# Patient Record
Sex: Female | Born: 1965 | Race: Black or African American | Hispanic: No | Marital: Single | Smoking: Current every day smoker
Health system: Southern US, Community
[De-identification: ages and names within clinical notes are randomized; demographics above are authoritative.]

## PROBLEM LIST (undated history)

## (undated) DIAGNOSIS — E119 Type 2 diabetes mellitus without complications: Secondary | ICD-10-CM

## (undated) DIAGNOSIS — M797 Fibromyalgia: Secondary | ICD-10-CM

## (undated) DIAGNOSIS — J449 Chronic obstructive pulmonary disease, unspecified: Secondary | ICD-10-CM

## (undated) DIAGNOSIS — I10 Essential (primary) hypertension: Secondary | ICD-10-CM

## (undated) DIAGNOSIS — J45909 Unspecified asthma, uncomplicated: Secondary | ICD-10-CM

---

## 2011-05-09 DIAGNOSIS — J45909 Unspecified asthma, uncomplicated: Secondary | ICD-10-CM | POA: Insufficient documentation

## 2011-09-20 DIAGNOSIS — M797 Fibromyalgia: Secondary | ICD-10-CM | POA: Insufficient documentation

## 2011-09-20 DIAGNOSIS — G4700 Insomnia, unspecified: Secondary | ICD-10-CM | POA: Insufficient documentation

## 2011-09-20 DIAGNOSIS — M1990 Unspecified osteoarthritis, unspecified site: Secondary | ICD-10-CM | POA: Insufficient documentation

## 2012-03-01 DIAGNOSIS — K219 Gastro-esophageal reflux disease without esophagitis: Secondary | ICD-10-CM | POA: Insufficient documentation

## 2013-01-24 DIAGNOSIS — F4310 Post-traumatic stress disorder, unspecified: Secondary | ICD-10-CM | POA: Insufficient documentation

## 2013-02-02 DIAGNOSIS — F3181 Bipolar II disorder: Secondary | ICD-10-CM | POA: Insufficient documentation

## 2013-04-21 DIAGNOSIS — G894 Chronic pain syndrome: Secondary | ICD-10-CM | POA: Insufficient documentation

## 2013-05-19 DIAGNOSIS — R32 Unspecified urinary incontinence: Secondary | ICD-10-CM | POA: Insufficient documentation

## 2013-05-22 DIAGNOSIS — Z79899 Other long term (current) drug therapy: Secondary | ICD-10-CM | POA: Insufficient documentation

## 2013-06-26 DIAGNOSIS — R252 Cramp and spasm: Secondary | ICD-10-CM | POA: Insufficient documentation

## 2014-04-03 DIAGNOSIS — K7581 Nonalcoholic steatohepatitis (NASH): Secondary | ICD-10-CM | POA: Insufficient documentation

## 2014-10-03 DIAGNOSIS — E876 Hypokalemia: Secondary | ICD-10-CM | POA: Insufficient documentation

## 2014-10-03 DIAGNOSIS — R296 Repeated falls: Secondary | ICD-10-CM | POA: Insufficient documentation

## 2016-02-03 DIAGNOSIS — I10 Essential (primary) hypertension: Secondary | ICD-10-CM | POA: Insufficient documentation

## 2016-08-27 DIAGNOSIS — Z8673 Personal history of transient ischemic attack (TIA), and cerebral infarction without residual deficits: Secondary | ICD-10-CM | POA: Insufficient documentation

## 2016-09-17 DIAGNOSIS — B351 Tinea unguium: Secondary | ICD-10-CM | POA: Insufficient documentation

## 2018-01-21 DIAGNOSIS — N9089 Other specified noninflammatory disorders of vulva and perineum: Secondary | ICD-10-CM | POA: Insufficient documentation

## 2018-06-22 DIAGNOSIS — I69319 Unspecified symptoms and signs involving cognitive functions following cerebral infarction: Secondary | ICD-10-CM | POA: Insufficient documentation

## 2018-08-21 DIAGNOSIS — F445 Conversion disorder with seizures or convulsions: Secondary | ICD-10-CM | POA: Insufficient documentation

## 2018-08-27 DIAGNOSIS — G929 Unspecified toxic encephalopathy: Secondary | ICD-10-CM | POA: Insufficient documentation

## 2018-12-04 DIAGNOSIS — F331 Major depressive disorder, recurrent, moderate: Secondary | ICD-10-CM | POA: Insufficient documentation

## 2019-08-18 DIAGNOSIS — N94819 Vulvodynia, unspecified: Secondary | ICD-10-CM | POA: Insufficient documentation

## 2019-10-23 DIAGNOSIS — K3184 Gastroparesis: Secondary | ICD-10-CM | POA: Insufficient documentation

## 2020-01-25 DIAGNOSIS — E1142 Type 2 diabetes mellitus with diabetic polyneuropathy: Secondary | ICD-10-CM | POA: Insufficient documentation

## 2020-01-25 DIAGNOSIS — IMO0002 Reserved for concepts with insufficient information to code with codable children: Secondary | ICD-10-CM | POA: Insufficient documentation

## 2020-06-17 DIAGNOSIS — R079 Chest pain, unspecified: Secondary | ICD-10-CM | POA: Insufficient documentation

## 2020-10-09 DIAGNOSIS — F419 Anxiety disorder, unspecified: Secondary | ICD-10-CM | POA: Insufficient documentation

## 2020-10-28 DIAGNOSIS — M2010 Hallux valgus (acquired), unspecified foot: Secondary | ICD-10-CM | POA: Insufficient documentation

## 2020-10-28 DIAGNOSIS — L600 Ingrowing nail: Secondary | ICD-10-CM | POA: Insufficient documentation

## 2021-03-09 ENCOUNTER — Emergency Department: Payer: Medicare HMO

## 2021-03-09 ENCOUNTER — Emergency Department
Admission: EM | Admit: 2021-03-09 | Discharge: 2021-03-09 | Disposition: A | Discharge status: Home / Self Care | Payer: Medicare HMO | Attending: Emergency Medicine | Admitting: Emergency Medicine

## 2021-03-09 ENCOUNTER — Other Ambulatory Visit: Payer: Self-pay

## 2021-03-09 DIAGNOSIS — R1013 Epigastric pain: Secondary | ICD-10-CM | POA: Diagnosis present

## 2021-03-09 DIAGNOSIS — K3184 Gastroparesis: Secondary | ICD-10-CM | POA: Diagnosis not present

## 2021-03-09 DIAGNOSIS — E1142 Type 2 diabetes mellitus with diabetic polyneuropathy: Secondary | ICD-10-CM | POA: Insufficient documentation

## 2021-03-09 DIAGNOSIS — R000 Tachycardia, unspecified: Secondary | ICD-10-CM | POA: Insufficient documentation

## 2021-03-09 DIAGNOSIS — D72829 Elevated white blood cell count, unspecified: Secondary | ICD-10-CM | POA: Insufficient documentation

## 2021-03-09 DIAGNOSIS — R079 Chest pain, unspecified: Secondary | ICD-10-CM | POA: Insufficient documentation

## 2021-03-09 DIAGNOSIS — J45909 Unspecified asthma, uncomplicated: Secondary | ICD-10-CM | POA: Diagnosis not present

## 2021-03-09 DIAGNOSIS — I10 Essential (primary) hypertension: Secondary | ICD-10-CM | POA: Diagnosis not present

## 2021-03-09 LAB — CBC WITH DIFFERENTIAL/PLATELET
Abs Immature Granulocytes: 0.05 10*3/uL (ref 0.00–0.07)
Basophils Absolute: 0 10*3/uL (ref 0.0–0.1)
Basophils Relative: 0 %
Eosinophils Absolute: 0.2 10*3/uL (ref 0.0–0.5)
Eosinophils Relative: 1 %
HCT: 41.3 % (ref 36.0–46.0)
Hemoglobin: 14.1 g/dL (ref 12.0–15.0)
Immature Granulocytes: 0 %
Lymphocytes Relative: 31 %
Lymphs Abs: 4.4 10*3/uL — ABNORMAL HIGH (ref 0.7–4.0)
MCH: 30.2 pg (ref 26.0–34.0)
MCHC: 34.1 g/dL (ref 30.0–36.0)
MCV: 88.4 fL (ref 80.0–100.0)
Monocytes Absolute: 0.6 10*3/uL (ref 0.1–1.0)
Monocytes Relative: 4 %
Neutro Abs: 9.2 10*3/uL — ABNORMAL HIGH (ref 1.7–7.7)
Neutrophils Relative %: 64 %
Platelets: 317 10*3/uL (ref 150–400)
RBC: 4.67 MIL/uL (ref 3.87–5.11)
RDW: 13.6 % (ref 11.5–15.5)
WBC: 14.5 10*3/uL — ABNORMAL HIGH (ref 4.0–10.5)
nRBC: 0 % (ref 0.0–0.2)

## 2021-03-09 LAB — COMPREHENSIVE METABOLIC PANEL
ALT: 49 U/L — ABNORMAL HIGH (ref 0–44)
AST: 32 U/L (ref 15–41)
Albumin: 4 g/dL (ref 3.5–5.0)
Alkaline Phosphatase: 106 U/L (ref 38–126)
Anion gap: 12 (ref 5–15)
BUN: 11 mg/dL (ref 6–20)
CO2: 27 mmol/L (ref 22–32)
Calcium: 10.1 mg/dL (ref 8.9–10.3)
Chloride: 97 mmol/L — ABNORMAL LOW (ref 98–111)
Creatinine, Ser: 0.81 mg/dL (ref 0.44–1.00)
GFR, Estimated: >60 mL/min (ref >60)
Glucose, Bld: 331 mg/dL — ABNORMAL HIGH (ref 70–99)
Potassium: 3.4 mmol/L — ABNORMAL LOW (ref 3.5–5.1)
Sodium: 136 mmol/L (ref 135–145)
Total Bilirubin: 0.8 mg/dL (ref 0.3–1.2)
Total Protein: 8.2 g/dL — ABNORMAL HIGH (ref 6.5–8.1)

## 2021-03-09 LAB — TROPONIN I (HIGH SENSITIVITY)
Troponin I (High Sensitivity): 4 ng/L (ref <18)
Troponin I (High Sensitivity): 4 ng/L (ref <18)

## 2021-03-09 LAB — CBG MONITORING, ED: Glucose-Capillary: 308 mg/dL — ABNORMAL HIGH (ref 70–99)

## 2021-03-09 LAB — LIPASE, BLOOD: Lipase: 29 U/L (ref 11–51)

## 2021-03-09 MED ORDER — ONDANSETRON HCL 4 MG/2ML IJ SOLN
4.0000 mg | Freq: Once | INTRAMUSCULAR | Status: AC
  Administered 2021-03-09: 4 mg via INTRAVENOUS
  Filled 2021-03-09: qty 2

## 2021-03-09 MED ORDER — FAMOTIDINE IN NACL 20-0.9 MG/50ML-% IV SOLN
20.0000 mg | Freq: Once | INTRAVENOUS | Status: AC
  Administered 2021-03-09: 20 mg via INTRAVENOUS
  Filled 2021-03-09: qty 50

## 2021-03-09 MED ORDER — FENTANYL CITRATE (PF) 100 MCG/2ML IJ SOLN
50.0000 ug | Freq: Once | INTRAMUSCULAR | Status: AC
  Administered 2021-03-09: 50 ug via INTRAVENOUS
  Filled 2021-03-09: qty 2

## 2021-03-09 MED ORDER — ONDANSETRON 4 MG PO TBDP: 4.0000 mg | ORAL_TABLET | Freq: Three times a day (TID) | ORAL | 0 refills | Status: DC | PRN

## 2021-03-09 MED ORDER — HALOPERIDOL LACTATE 5 MG/ML IJ SOLN
5.0000 mg | Freq: Once | INTRAMUSCULAR | Status: AC
  Administered 2021-03-09: 5 mg via INTRAVENOUS
  Filled 2021-03-09: qty 1

## 2021-03-09 MED ORDER — LACTATED RINGERS IV BOLUS
1000.0000 mL | Freq: Once | INTRAVENOUS | Status: AC
  Administered 2021-03-09: 1000 mL via INTRAVENOUS

## 2021-03-09 NOTE — ED Provider Notes (Signed)
[facility] Emergency Department Provider Note  ____________________________________________  Time seen: Approximately 3:06 AM  I have reviewed the triage vital signs and the nursing notes.   HISTORY  Chief Complaint Chest Pain (Chest pain with n/v onset of 0600)   HPI Alicia Mayo is a 55 y.o. female with history of diabetes, gastroparesis, hypertension, bipolar disorder, PTSD, fibromyalgia, GERD who presents for evaluation of abdominal pain nausea and vomiting.  Patient reports being in her usual state of health until around 6 PM.  She started having epigastric burning pain radiating up to her chest associated with nausea and a couple of episodes of nonbloody nonbilious emesis.  She check her blood glucose which was in the 60s.  She reports drinking some apple juice and rechecking and was still low in the 40s.  She then drank some maple syrup and called EMS.  CBG per EMS was 230.  Patient denies diarrhea, shortness of breath, cough, fever   No past medical history on file.  Patient Active Problem List   Diagnosis Date Noted  . Onychocryptosis 10/28/2020  . Anxiety 10/09/2020  . Chest pain 06/17/2020  . Uncontrolled type 2 diabetes mellitus with diabetic polyneuropathy, with long-term current use of insulin (HCC) 01/25/2020  . Gastroparesis 10/23/2019  . Moderate episode of recurrent major depressive disorder (HCC) 12/04/2018  . Encephalopathy, toxic 08/27/2018  . Cognitive deficit S/P CVA (cerebrovascular accident) 06/22/2018  . Lesion of vulva 01/21/2018  . Onychomycosis 09/17/2016  . History of stroke 08/27/2016  . Essential hypertension 02/03/2016  . Falls frequently 10/03/2014  . Hypokalemia 10/03/2014  . NASH (nonalcoholic steatohepatitis) 04/03/2014  . Leg cramps 06/26/2013  . Bipolar II disorder (HCC) 02/02/2013  . PTSD (post-traumatic stress disorder) 01/24/2013  . Esophageal reflux 03/01/2012  . Fibromyalgia 09/20/2011  . Insomnia  09/20/2011  . Osteoarthritis 09/20/2011  . Asthma 05/09/2011     Allergies Celecoxib, Gabapentin, Levetiracetam, Oxycodone-acetaminophen, Quetiapine, Shellfish allergy, Soap, Acetaminophen, Alprazolam, Atorvastatin, Diazepam, Dicyclomine, Ibuprofen, Ivp dye [iodinated diagnostic agents], Oxycodone, Propoxyphene, Sertraline, Trazodone, Acetaminophen-codeine, Aspirin, Codeine, Tramadol, and Zolpidem  No family history on file.  Social History    Review of Systems  Constitutional: Negative for fever. Eyes: Negative for visual changes. ENT: Negative for sore throat. Neck: No neck pain  Cardiovascular: Negative for chest pain. Respiratory: Negative for shortness of breath. Gastrointestinal: + abdominal pain, nausea, and vomiting. no diarrhea. Genitourinary: Negative for dysuria. Musculoskeletal: Negative for back pain. Skin: Negative for rash. Neurological: Negative for headaches, weakness or numbness. Psych: No SI or HI  ____________________________________________   PHYSICAL EXAM:  VITAL SIGNS: ED Triage Vitals  Enc Vitals Group     BP 03/09/21 0100 (!) 151/91     Pulse Rate 03/09/21 0100 (!) 117     Resp --      Temp 03/09/21 0100 99.6 F (37.6 C)     Temp Source 03/09/21 0100 Oral     SpO2 03/09/21 0100 98 %     Weight 03/09/21 0102 216 lb 7.9 oz (98.2 kg)     Height 03/09/21 0102 5\' 7"  (1.702 m)     Head Circumference --      Peak Flow --      Pain Score 03/09/21 0101 10     Pain Loc --      Pain Edu? --      Excl. in GC? --     Constitutional: Alert and oriented, actively vomiting and anxious.  HEENT:      Head: Normocephalic and  atraumatic.         Eyes: Conjunctivae are normal. Sclera is non-icteric.       Mouth/Throat: Mucous membranes are moist.       Neck: Supple with no signs of meningismus. Cardiovascular: Tachycardic with regular rhythm Respiratory: Normal respiratory effort. Lungs are clear to auscultation bilaterally.  Gastrointestinal: Soft,  tender to palpation on the upper quadrants, and non distended with positive bowel sounds. No rebound or guarding. Musculoskeletal:  No edema, cyanosis, or erythema of extremities. Neurologic: Normal speech and language. Face is symmetric. Moving all extremities. No gross focal neurologic deficits are appreciated. Skin: Skin is warm, dry and intact. No rash noted. Psychiatric: Mood and affect are normal. Speech and behavior are normal.  ____________________________________________   LABS (all labs ordered are listed, but only abnormal results are displayed)  Labs Reviewed  CBC WITH DIFFERENTIAL/PLATELET - Abnormal; Notable for the following components:      Result Value   WBC 14.5 (*)    Neutro Abs 9.2 (*)    Lymphs Abs 4.4 (*)    All other components within normal limits  COMPREHENSIVE METABOLIC PANEL - Abnormal; Notable for the following components:   Potassium 3.4 (*)    Chloride 97 (*)    Glucose, Bld 331 (*)    Total Protein 8.2 (*)    ALT 49 (*)    All other components within normal limits  LIPASE, BLOOD  URINALYSIS, COMPLETE (UACMP) WITH MICROSCOPIC  TROPONIN I (HIGH SENSITIVITY)  TROPONIN I (HIGH SENSITIVITY)   ____________________________________________  EKG  ED ECG REPORT I, Nita Sickle, the attending physician, personally viewed and interpreted this ECG.  Sinus tachycardia, rate of 119, normal intervals, normal axis, T wave inversions in inferior leads.  No prior for comparison. ____________________________________________  RADIOLOGY  I have personally reviewed the images performed during this visit and I agree with the Radiologist's read.   Interpretation by Radiologist:  CT ABDOMEN PELVIS WO CONTRAST  Result Date: 03/09/2021 CLINICAL DATA:  Abdominal pain, hypoglycemia EXAM: CT ABDOMEN AND PELVIS WITHOUT CONTRAST TECHNIQUE: Multidetector CT imaging of the abdomen and pelvis was performed following the standard protocol without IV contrast. COMPARISON:   None. FINDINGS: Lower chest: Lung bases are clear. Normal heart size. No pericardial effusion. Hepatobiliary: Diffuse hepatic hypoattenuation. No visible focal liver lesions on this unenhanced exam. Smooth liver surface contour. Prior cholecystectomy. No significant biliary ductal dilatation or visible intraductal gallstones. Pancreas: No pancreatic ductal dilatation or surrounding inflammatory changes. Spleen: Normal in size. No concerning splenic lesions. Adrenals/Urinary Tract: Normal adrenals. Kidneys are symmetric in size and normally located. No visible or contour deforming renal lesions. No urolithiasis or hydronephrosis. Urinary bladder is largely decompressed at the time of exam and therefore poorly evaluated by CT imaging. No gross bladder abnormality accounting for underdistention. Stomach/Bowel: Small sliding-type hiatal hernia. Distal stomach and duodenum unremarkable accounting for normal peristaltic motion. No small bowel thickening or dilatation. Normal air-filled appendix in the right lower quadrant without periappendiceal inflammation. No colonic dilatation or wall thickening. No evidence of bowel obstruction. Vascular/Lymphatic: Atherosclerotic calcifications within the abdominal aorta and branch vessels. No aneurysm or ectasia. No enlarged abdominopelvic lymph nodes. Reproductive: Uterus appears surgically absent. No concerning adnexal lesions. Other: No abdominopelvic free fluid or free gas. No bowel containing hernias. Tiny fat containing umbilical hernia. Mild posterior body wall edema. Musculoskeletal: Multilevel degenerative changes are present in the imaged portions of the spine. Grade 1 anterolisthesis L5 on S1 without associated spondylolysis. Additional degenerative changes in the hips and  pelvis. No acute osseous abnormality or suspicious osseous lesion. IMPRESSION: No acute CT abnormality to provide explanation patient's abdominal pain. Diffuse hepatic hypoattenuation compatible with  hepatic steatosis. Small hiatal hernia. Postsurgical changes from prior cholecystectomy, hysterectomy. Correlate with surgical history. Electronically Signed   By: Kreg Shropshire M.D.   On: 03/09/2021 02:59     ____________________________________________   PROCEDURES  Procedure(s) performed:yes .1-3 Lead EKG Interpretation Performed by: Nita Sickle, MD Authorized by: Nita Sickle, MD     Interpretation: non-specific     ECG rate assessment: tachycardic     Rhythm: sinus tachycardia     Ectopy: none     Conduction: normal     Critical Care performed:  None ____________________________________________   INITIAL IMPRESSION / ASSESSMENT AND PLAN / ED COURSE   55 y.o. female with history of diabetes, gastroparesis, hypertension, bipolar disorder, PTSD, fibromyalgia, GERD who presents for evaluation of abdominal pain nausea and vomiting.  Patient actively vomiting and pretty anxious, slightly tachycardic and hypertensive.  Temp of 99.19F, abdomen diffusely tender on the upper quadrants with no rebound or guarding, positive bowel sounds  Ddx gastroparesis versus gastritis versus food poisoning versus peptic ulcer disease versus GERD versus pancreatitis versus gallbladder disease versus SBO.  Labs showing leukocytosis with a left shift, hyperglycemia with no evidence of DKA, normal LFTs and lipase.  EKG with no signs of ischemia.  Troponin is negative.  CT abdomen pelvis without acute pathology, confirmed by radiology.  Most likely gastroparesis.  Associated with IV fentanyl, IV Zofran and IV fluids.  Will reassess for disposition.  Old medical records reviewed  _________________________ 5:08 AM on 03/09/2021 -----------------------------------------  Patient was still complaining of nausea and pain after receiving fentanyl therefore she was given a dose of IV Haldol.  Now reassessed and feels markedly improved, tolerating p.o. with no further episodes of vomiting.   Presentation most likely concerning for gastroparesis.  Will discharge home with Zofran, bland diet, and follow-up with primary care doctor.  Discussed my standard return precautions for worsening symptoms, fever or dehydration.     _____________________________________________ Please note:  Patient was evaluated in Emergency Department today for the symptoms described in the history of present illness. Patient was evaluated in the context of the global COVID-19 pandemic, which necessitated consideration that the patient might be at risk for infection with the SARS-CoV-2 virus that causes COVID-19. Institutional protocols and algorithms that pertain to the evaluation of patients at risk for COVID-19 are in a state of rapid change based on information released by regulatory bodies including the CDC and federal and state organizations. These policies and algorithms were followed during the patient's care in the ED.  Some ED evaluations and interventions may be delayed as a result of limited staffing during the pandemic.   [place] Controlled Substance Database was reviewed by me. ____________________________________________   FINAL CLINICAL IMPRESSION(S) / ED DIAGNOSES   Final diagnoses:  Gastroparesis      NEW MEDICATIONS STARTED DURING THIS VISIT:  ED Discharge Orders         Ordered    ondansetron (ZOFRAN ODT) 4 MG disintegrating tablet  Every 8 hours PRN        03/09/21 0508           Note:  This document was prepared using Dragon voice recognition software and may include unintentional dictation errors.    Nita Sickle, MD 03/09/21 [phone]

## 2021-03-09 NOTE — ED Notes (Signed)
Pt tolerated PO fluids

## 2021-03-09 NOTE — ED Triage Notes (Signed)
Pt brought in by EMS. Pt c/o of chest pain, N/V  since 1800. Pt states she checked her blood sugar and it was in the 60s then rechecked it and it was in the 40s. Pt states she drank apple juice then drank maple syrup. Per EMS CBG was 230

## 2021-06-25 DIAGNOSIS — E785 Hyperlipidemia, unspecified: Secondary | ICD-10-CM | POA: Insufficient documentation

## 2021-06-25 DIAGNOSIS — F1120 Opioid dependence, uncomplicated: Secondary | ICD-10-CM | POA: Insufficient documentation

## 2021-06-25 DIAGNOSIS — F17200 Nicotine dependence, unspecified, uncomplicated: Secondary | ICD-10-CM | POA: Insufficient documentation

## 2021-06-25 DIAGNOSIS — J438 Other emphysema: Secondary | ICD-10-CM | POA: Insufficient documentation

## 2021-10-13 ENCOUNTER — Emergency Department
Admission: EM | Admit: 2021-10-13 | Discharge: 2021-10-13 | Disposition: A | Discharge status: Home / Self Care | Payer: Medicare HMO | Attending: Emergency Medicine | Admitting: Emergency Medicine

## 2021-10-13 ENCOUNTER — Emergency Department: Payer: Medicare HMO

## 2021-10-13 ENCOUNTER — Other Ambulatory Visit: Payer: Self-pay

## 2021-10-13 DIAGNOSIS — Z20822 Contact with and (suspected) exposure to covid-19: Secondary | ICD-10-CM | POA: Diagnosis not present

## 2021-10-13 DIAGNOSIS — E1142 Type 2 diabetes mellitus with diabetic polyneuropathy: Secondary | ICD-10-CM | POA: Diagnosis not present

## 2021-10-13 DIAGNOSIS — R112 Nausea with vomiting, unspecified: Secondary | ICD-10-CM | POA: Insufficient documentation

## 2021-10-13 DIAGNOSIS — J45909 Unspecified asthma, uncomplicated: Secondary | ICD-10-CM | POA: Insufficient documentation

## 2021-10-13 DIAGNOSIS — R059 Cough, unspecified: Secondary | ICD-10-CM | POA: Diagnosis present

## 2021-10-13 DIAGNOSIS — R051 Acute cough: Secondary | ICD-10-CM | POA: Diagnosis not present

## 2021-10-13 LAB — COMPREHENSIVE METABOLIC PANEL
ALT: 48 U/L — ABNORMAL HIGH (ref 0–44)
AST: 43 U/L — ABNORMAL HIGH (ref 15–41)
Albumin: 3.7 g/dL (ref 3.5–5.0)
Alkaline Phosphatase: 85 U/L (ref 38–126)
Anion gap: 6 (ref 5–15)
BUN: 9 mg/dL (ref 6–20)
CO2: 28 mmol/L (ref 22–32)
Calcium: 9.3 mg/dL (ref 8.9–10.3)
Chloride: 101 mmol/L (ref 98–111)
Creatinine, Ser: 0.74 mg/dL (ref 0.44–1.00)
GFR, Estimated: >60 mL/min (ref >60)
Glucose, Bld: 161 mg/dL — ABNORMAL HIGH (ref 70–99)
Potassium: 3.4 mmol/L — ABNORMAL LOW (ref 3.5–5.1)
Sodium: 135 mmol/L (ref 135–145)
Total Bilirubin: 0.5 mg/dL (ref 0.3–1.2)
Total Protein: 7.7 g/dL (ref 6.5–8.1)

## 2021-10-13 LAB — CBC WITH DIFFERENTIAL/PLATELET
Abs Immature Granulocytes: 0.04 10*3/uL (ref 0.00–0.07)
Basophils Absolute: 0 10*3/uL (ref 0.0–0.1)
Basophils Relative: 0 %
Eosinophils Absolute: 0.2 10*3/uL (ref 0.0–0.5)
Eosinophils Relative: 2 %
HCT: 39.1 % (ref 36.0–46.0)
Hemoglobin: 12.8 g/dL (ref 12.0–15.0)
Immature Granulocytes: 0 %
Lymphocytes Relative: 42 %
Lymphs Abs: 4.6 10*3/uL — ABNORMAL HIGH (ref 0.7–4.0)
MCH: 29.2 pg (ref 26.0–34.0)
MCHC: 32.7 g/dL (ref 30.0–36.0)
MCV: 89.3 fL (ref 80.0–100.0)
Monocytes Absolute: 0.4 10*3/uL (ref 0.1–1.0)
Monocytes Relative: 4 %
Neutro Abs: 5.6 10*3/uL (ref 1.7–7.7)
Neutrophils Relative %: 52 %
Platelets: 270 10*3/uL (ref 150–400)
RBC: 4.38 MIL/uL (ref 3.87–5.11)
RDW: 14.8 % (ref 11.5–15.5)
WBC: 10.8 10*3/uL — ABNORMAL HIGH (ref 4.0–10.5)
nRBC: 0 % (ref 0.0–0.2)

## 2021-10-13 LAB — URINALYSIS, COMPLETE (UACMP) WITH MICROSCOPIC
Bacteria, UA: NONE SEEN
Bilirubin Urine: NEGATIVE
Glucose, UA: NEGATIVE mg/dL
Hgb urine dipstick: NEGATIVE
Ketones, ur: NEGATIVE mg/dL
Leukocytes,Ua: NEGATIVE
Nitrite: NEGATIVE
Protein, ur: NEGATIVE mg/dL
Specific Gravity, Urine: 1.018 (ref 1.005–1.030)
pH: 5 (ref 5.0–8.0)

## 2021-10-13 LAB — CBG MONITORING, ED: Glucose-Capillary: 165 mg/dL — ABNORMAL HIGH (ref 70–99)

## 2021-10-13 LAB — LIPASE, BLOOD: Lipase: 23 U/L (ref 11–51)

## 2021-10-13 LAB — RESP PANEL BY RT-PCR (FLU A&B, COVID) ARPGX2
Influenza A by PCR: NEGATIVE
Influenza B by PCR: NEGATIVE
SARS Coronavirus 2 by RT PCR: NEGATIVE

## 2021-10-13 MED ORDER — ONDANSETRON HCL 4 MG PO TABS: 4.0000 mg | ORAL_TABLET | Freq: Three times a day (TID) | ORAL | 0 refills | Status: AC | PRN

## 2021-10-13 MED ORDER — BENZONATATE 100 MG PO CAPS: 100.0000 mg | ORAL_CAPSULE | Freq: Three times a day (TID) | ORAL | 0 refills | Status: AC | PRN

## 2021-10-13 NOTE — ED Provider Notes (Signed)
  Emergency Medicine Provider Triage Evaluation Note  Alicia Mayo , a 55 y.o.female,  was evaluated in triage.  Pt complains of sinus congestion, cough, fever, and body aches X3-4 days.  She states that she was recently exposed to several people who have tested positive for flu, COVID, and RSV.  Endorses some shortness of breath.  Denies chest pain, abdominal pain, urinary symptoms, or nausea/vomiting.   Review of Systems  Positive: Sinus congestion, cough, fever Negative: Denies fever, chest pain, vomiting  Physical Exam   Vitals:   10/13/21 1444  BP: (!) 142/106  Pulse: (!) 102  Resp: 20  Temp: 98.4 F (36.9 C)  SpO2: 98%   Gen:   Awake, no distress   Resp:  Normal effort  MSK:   Moves extremities without difficulty  Other:    Medical Decision Making  Given the patient's initial medical screening exam, the following diagnostic evaluation has been ordered. The patient will be placed in the appropriate treatment space, once one is available, to complete the evaluation and treatment. I have discussed the plan of care with the patient and I have advised the patient that an ED physician or mid-level practitioner will reevaluate their condition after the test results have been received, as the results may give them additional insight into the type of treatment they may need.    Diagnostics: CXR, respiratory panel.  Treatments: none immediately   Varney Daily, PA 10/13/21 1500    Jene Every, MD 10/13/21 [phone]

## 2021-10-13 NOTE — ED Provider Notes (Signed)
[facility]-EMERGENCY DEPARTMENT  ____________________________________________  Time seen: Approximately 4:45 PM  I have reviewed the triage vital signs and the nursing notes.   HISTORY  Chief Complaint Nasal Congestion   Historian Patient     HPI Alicia Mayo is a 55 y.o. female with a history of diabetes, CVA, and asthma presents to the emergency department with nausea, emesis, cough and body aches for approximately once week.  She reports that she has had sick contacts in her home with similar symptoms.  She states that her sugars have been running in the 400s at home and she has not been compliant with her diabetes medication.  She denies chest pain, chest tightness or abdominal pain.   No past medical history on file.   Immunizations up to date:  Yes.     No past medical history on file.  Patient Active Problem List   Diagnosis Date Noted   Onychocryptosis 10/28/2020   Anxiety 10/09/2020   Chest pain 06/17/2020   Uncontrolled type 2 diabetes mellitus with diabetic polyneuropathy, with long-term current use of insulin 01/25/2020   Gastroparesis 10/23/2019   Moderate episode of recurrent major depressive disorder (HCC) 12/04/2018   Encephalopathy, toxic 08/27/2018   Cognitive deficit S/P CVA (cerebrovascular accident) 06/22/2018   Lesion of vulva 01/21/2018   Onychomycosis 09/17/2016   History of stroke 08/27/2016   Essential hypertension 02/03/2016   Falls frequently 10/03/2014   Hypokalemia 10/03/2014   NASH (nonalcoholic steatohepatitis) 04/03/2014   Leg cramps 06/26/2013   Bipolar II disorder (HCC) 02/02/2013   PTSD (post-traumatic stress disorder) 01/24/2013   Esophageal reflux 03/01/2012   Fibromyalgia 09/20/2011   Insomnia 09/20/2011   Osteoarthritis 09/20/2011   Asthma 05/09/2011      Prior to Admission medications   Medication Sig Start Date End Date Taking? Authorizing Provider  ondansetron (ZOFRAN ODT) 4 MG disintegrating tablet Take 1 tablet (4  mg total) by mouth every 8 (eight) hours as needed. 03/09/21   Nita Sickle, MD    Allergies Celecoxib, Gabapentin, Levetiracetam, Oxycodone-acetaminophen, Quetiapine, Shellfish allergy, Soap, Acetaminophen, Alprazolam, Atorvastatin, Diazepam, Dicyclomine, Ibuprofen, Ivp dye [iodinated diagnostic agents], Oxycodone, Propoxyphene, Sertraline, Trazodone, Acetaminophen-codeine, Aspirin, Codeine, Tramadol, and Zolpidem  No family history on file.  Social History      Review of Systems  Constitutional: Patient has fever.  Eyes: No visual changes. No discharge ENT: Patient has congestion.  Cardiovascular: no chest pain. Respiratory: Patient has cough.  Gastrointestinal: No abdominal pain.  No nausea, no vomiting. Patient had diarrhea.  Genitourinary: Negative for dysuria. No hematuria Musculoskeletal: Patient has myalgias.  Skin: Negative for rash, abrasions, lacerations, ecchymosis. Neurological: Patient has headache, no focal weakness or numbness.   ____________________________________________   PHYSICAL EXAM:  VITAL SIGNS: ED Triage Vitals  Enc Vitals Group     BP 10/13/21 1444 (!) 142/106     Pulse Rate 10/13/21 1444 (!) 102     Resp 10/13/21 1444 20     Temp 10/13/21 1444 98.4 F (36.9 C)     Temp Source 10/13/21 1444 Oral     SpO2 10/13/21 1444 98 %     Weight 10/13/21 1445 209 lb (94.8 kg)     Height 10/13/21 1445 5\' 7"  (1.702 m)     Head Circumference --      Peak Flow --      Pain Score 10/13/21 1444 10     Pain Loc --      Pain Edu? --      Excl. in GC? --  Constitutional: Alert and oriented. Patient is lying supine. Eyes: Conjunctivae are normal. PERRL. EOMI. Head: Atraumatic. ENT:      Ears: Tympanic membranes are mildly injected with mild effusion bilaterally.       Nose: No congestion/rhinnorhea.      Mouth/Throat: Mucous membranes are moist. Posterior pharynx is mildly erythematous.  Hematological/Lymphatic/Immunilogical: No cervical  lymphadenopathy.  Cardiovascular: Normal rate, regular rhythm. Normal S1 and S2.  Good peripheral circulation. Respiratory: Normal respiratory effort without tachypnea or retractions. Lungs CTAB. Good air entry to the bases with no decreased or absent breath sounds. Gastrointestinal: Bowel sounds 4 quadrants. Soft and nontender to palpation. No guarding or rigidity. No palpable masses. No distention. No CVA tenderness. Musculoskeletal: Full range of motion to all extremities. No gross deformities appreciated. Neurologic:  Normal speech and language. No gross focal neurologic deficits are appreciated.  Skin:  Skin is warm, dry and intact. No rash noted. Psychiatric: Mood and affect are normal. Speech and behavior are normal. Patient exhibits appropriate insight and judgement.   ____________________________________________   LABS (all labs ordered are listed, but only abnormal results are displayed)  Labs Reviewed  CBG MONITORING, ED - Abnormal; Notable for the following components:      Result Value   Glucose-Capillary 165 (*)    All other components within normal limits  RESP PANEL BY RT-PCR (FLU A&B, COVID) [id number]  CBC WITH DIFFERENTIAL/PLATELET  COMPREHENSIVE METABOLIC PANEL  URINALYSIS, COMPLETE (UACMP) WITH MICROSCOPIC  LIPASE, BLOOD   ____________________________________________  EKG   ____________________________________________  RADIOLOGY Unk Pinto, personally viewed and evaluated these images (plain radiographs) as part of my medical decision making, as well as reviewing the written report by the radiologist.  DG Chest 2 View  Result Date: 10/13/2021 CLINICAL DATA:  Fever, cough, congestion, and body aches. EXAM: CHEST - 2 VIEW COMPARISON:  None. FINDINGS: The heart size and mediastinal contours are within normal limits. Both lungs are clear. The visualized skeletal structures are unremarkable. IMPRESSION: No active cardiopulmonary disease. Electronically Signed    By: Titus Dubin M.D.   On: 10/13/2021 15:43    ____________________________________________    PROCEDURES  Procedure(s) performed:     Procedures     Medications - No data to display   ____________________________________________   INITIAL IMPRESSION / ASSESSMENT AND PLAN / ED COURSE  Pertinent labs & imaging results that were available during my care of the patient were reviewed by me and considered in my medical decision making (see chart for details).       Assessment and plan:  Cough 55 year old female presents to the emergency department with flulike symptoms that have occurred for approximately 1 week.  Patient reported that her glucose levels have been around 400 at home.  Basic labs were obtained and patient's glucose was reassuring at 165.  CBC indicated mild leukocytosis but was otherwise unremarkable.  Patient had mild elevation of AST and ALT on CMP.  She tested negative for COVID and flu.  Urinalysis showed no signs of UTI.  No consolidations, opacities or infiltrates on chest x-ray.  Recommended Tessalon Perles and Zofran as well as Tylenol as needed for fever and body aches.  Return precautions were given to return with new or worsening symptoms.   ____________________________________________  FINAL CLINICAL IMPRESSION(S) / ED DIAGNOSES  Final diagnoses:  Acute cough      NEW MEDICATIONS STARTED DURING THIS VISIT:  ED Discharge Orders     None  This chart was dictated using voice recognition software/Dragon. Despite best efforts to proofread, errors can occur which can change the meaning. Any change was purely unintentional.     Karren Cobble 10/13/21 2104    Vladimir Crofts, MD 10/13/21 2107

## 2021-10-13 NOTE — Discharge Instructions (Signed)
Take Tessalon Perles for cough. You can take Zofran for nausea.

## 2021-10-13 NOTE — ED Triage Notes (Signed)
Pt states last week she was exposed to flu, covid and RSV. Pt states now having congestion, cough, fever and body aches.

## 2021-10-13 NOTE — ED Notes (Signed)
Pt brought in by [facility] for cold symptoms and fever.

## 2021-11-09 DIAGNOSIS — I639 Cerebral infarction, unspecified: Secondary | ICD-10-CM

## 2021-11-09 HISTORY — DX: Cerebral infarction, unspecified: I63.9

## 2021-12-04 DIAGNOSIS — E1140 Type 2 diabetes mellitus with diabetic neuropathy, unspecified: Secondary | ICD-10-CM | POA: Insufficient documentation

## 2022-03-26 DIAGNOSIS — Z794 Long term (current) use of insulin: Secondary | ICD-10-CM | POA: Insufficient documentation

## 2022-03-27 DIAGNOSIS — R1312 Dysphagia, oropharyngeal phase: Secondary | ICD-10-CM | POA: Insufficient documentation

## 2022-03-27 DIAGNOSIS — R41841 Cognitive communication deficit: Secondary | ICD-10-CM | POA: Insufficient documentation

## 2023-01-16 IMAGING — CR DG CHEST 2V
1 series · 2 of 2 positions shown · non-contrast
Comparison: None.

CLINICAL DATA: Fever, cough, congestion, and body aches.

EXAM:
CHEST - 2 VIEW

[Series 1: dg chest 2 view · 0.14mm/px · 2 of 2 slices shown]
[im 1/2]
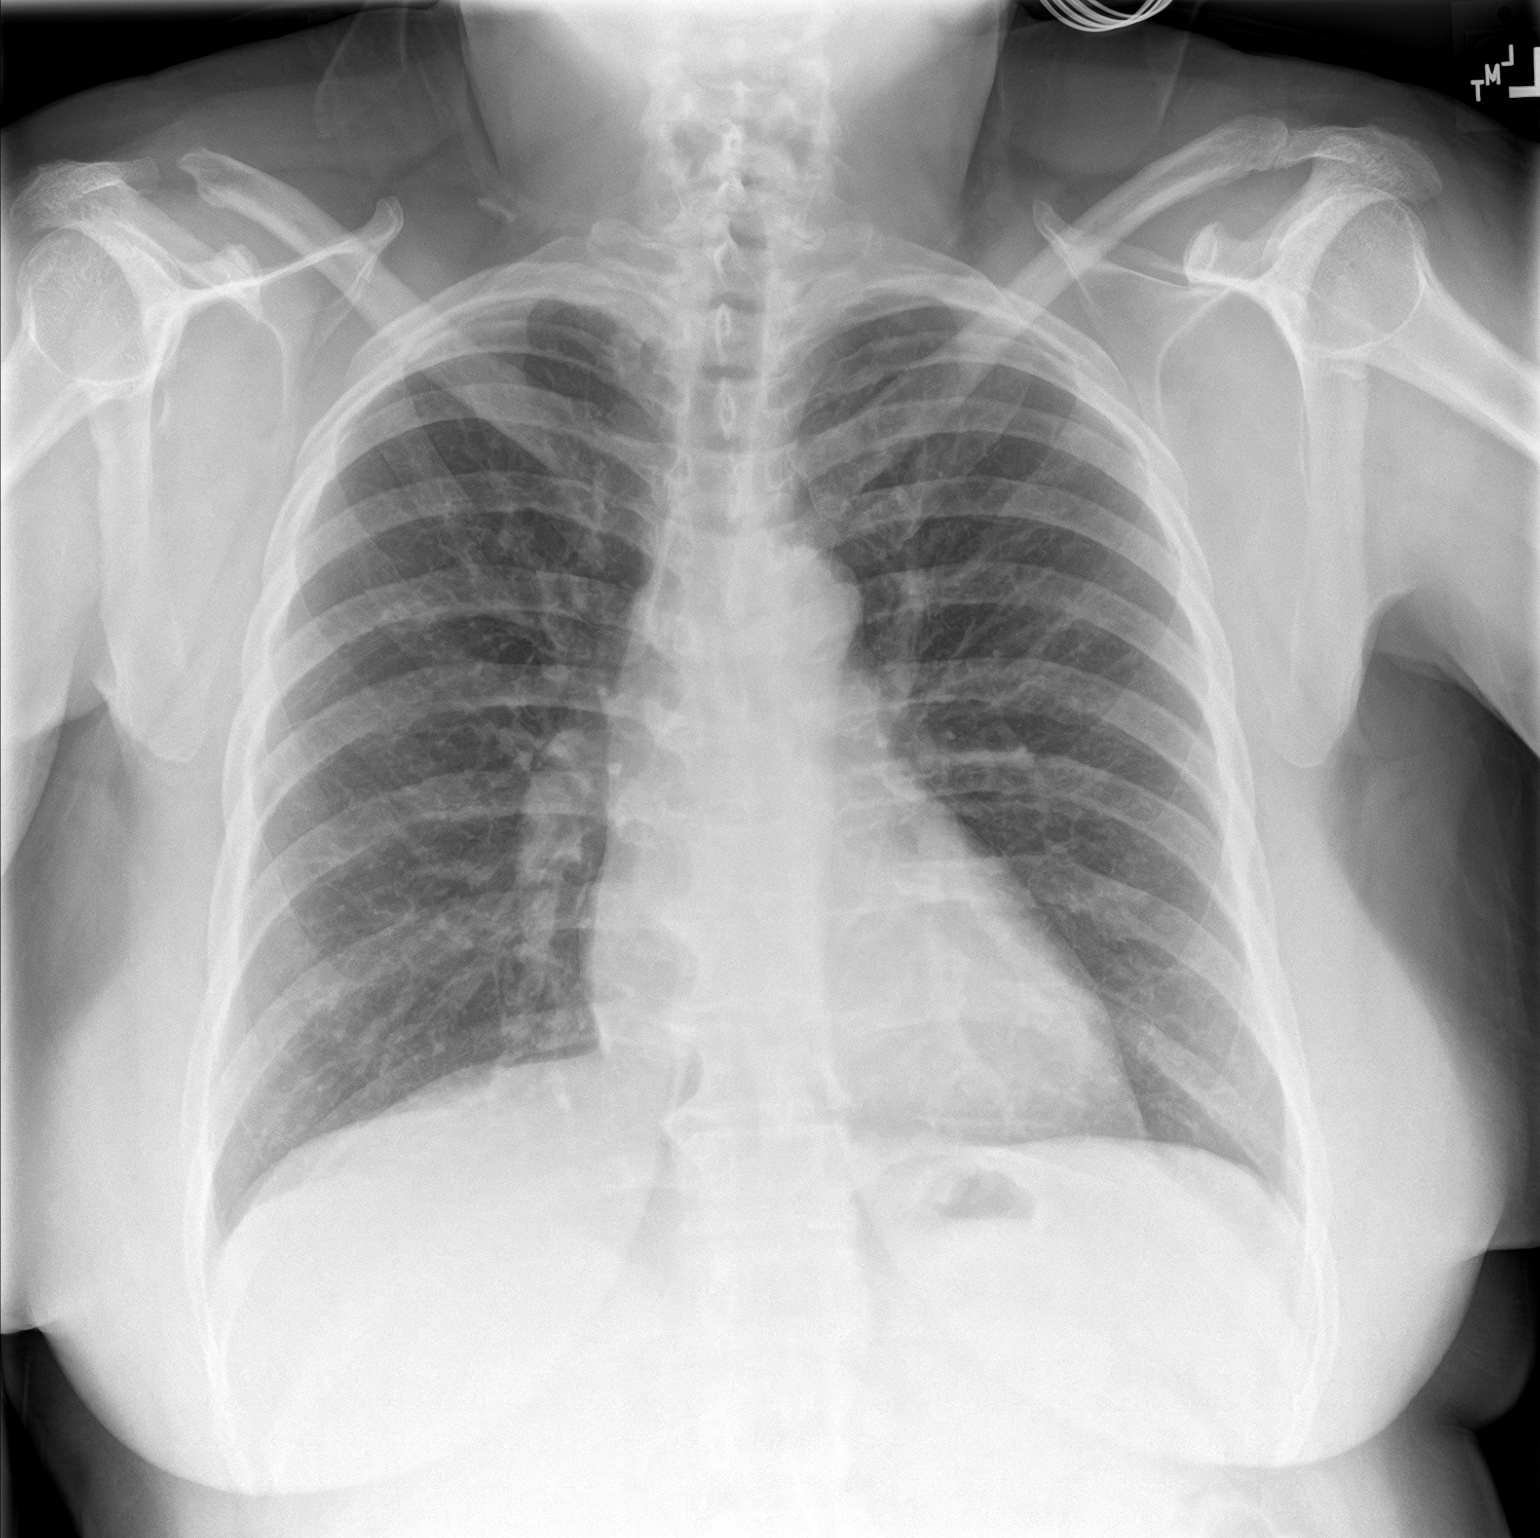
[im 2/2]
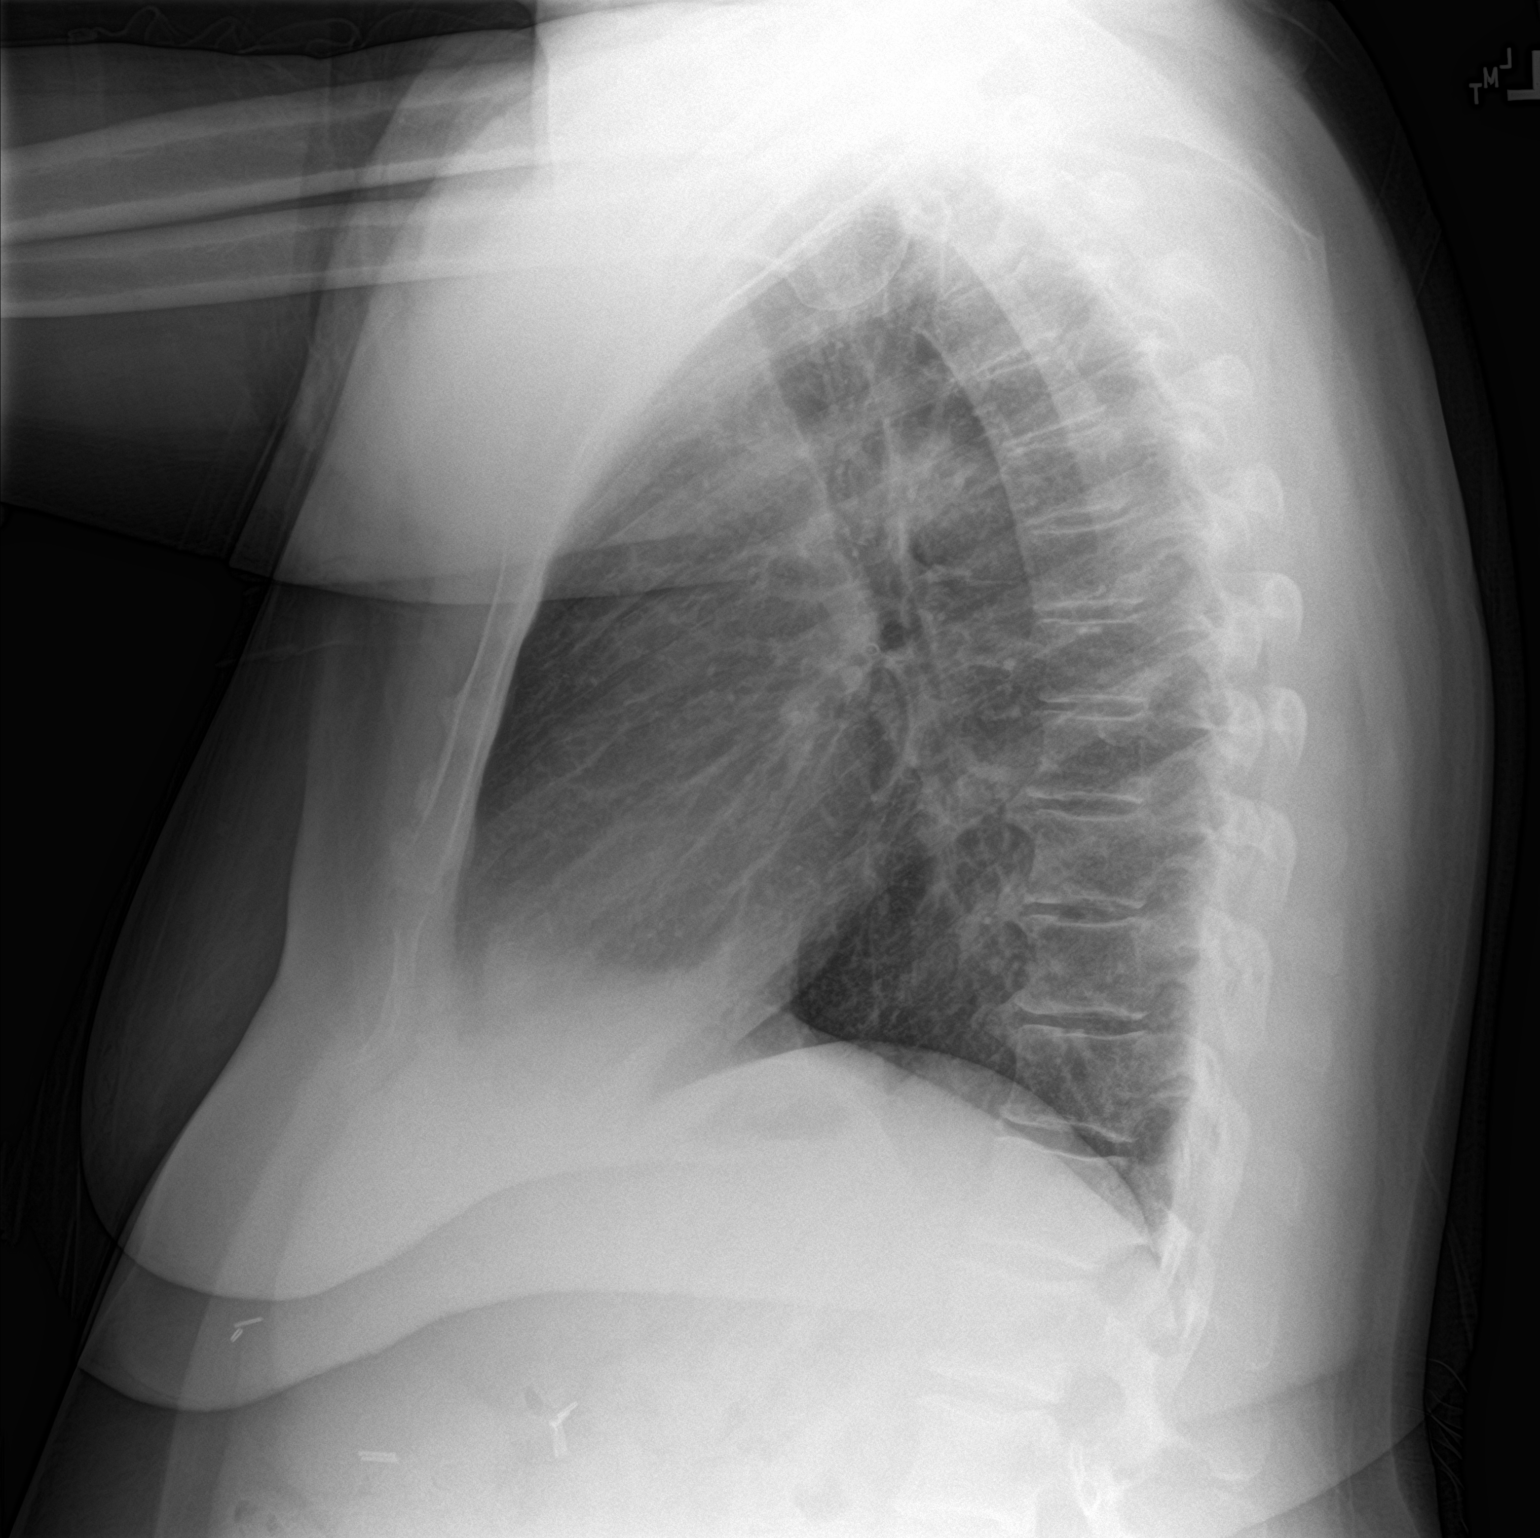

[2 of 2 positions shown; findings below may reference images not displayed]

FINDINGS: The heart size and mediastinal contours are within normal limits.
Both lungs are clear. The visualized skeletal structures are
unremarkable.
IMPRESSION: No active cardiopulmonary disease.

## 2023-07-01 ENCOUNTER — Emergency Department: Payer: 59

## 2023-07-01 ENCOUNTER — Emergency Department
Admission: EM | Admit: 2023-07-01 | Discharge: 2023-07-01 | Disposition: A | Discharge status: Home / Self Care | Payer: 59 | Attending: Emergency Medicine | Admitting: Emergency Medicine

## 2023-07-01 ENCOUNTER — Other Ambulatory Visit: Payer: Self-pay

## 2023-07-01 DIAGNOSIS — Z8673 Personal history of transient ischemic attack (TIA), and cerebral infarction without residual deficits: Secondary | ICD-10-CM | POA: Insufficient documentation

## 2023-07-01 DIAGNOSIS — I10 Essential (primary) hypertension: Secondary | ICD-10-CM | POA: Diagnosis not present

## 2023-07-01 DIAGNOSIS — N390 Urinary tract infection, site not specified: Secondary | ICD-10-CM | POA: Insufficient documentation

## 2023-07-01 DIAGNOSIS — R319 Hematuria, unspecified: Secondary | ICD-10-CM | POA: Diagnosis present

## 2023-07-01 DIAGNOSIS — R310 Gross hematuria: Secondary | ICD-10-CM | POA: Insufficient documentation

## 2023-07-01 DIAGNOSIS — E119 Type 2 diabetes mellitus without complications: Secondary | ICD-10-CM | POA: Insufficient documentation

## 2023-07-01 HISTORY — DX: Type 2 diabetes mellitus without complications: E11.9

## 2023-07-01 HISTORY — DX: Essential (primary) hypertension: I10

## 2023-07-01 LAB — URINALYSIS, ROUTINE W REFLEX MICROSCOPIC
Bilirubin Urine: NEGATIVE
Glucose, UA: >=500 mg/dL — AB
Ketones, ur: NEGATIVE mg/dL
Nitrite: NEGATIVE
Protein, ur: NEGATIVE mg/dL
RBC / HPF: >50 RBC/hpf (ref 0–5)
Specific Gravity, Urine: 1.017 (ref 1.005–1.030)
WBC, UA: >50 WBC/hpf (ref 0–5)
pH: 7 (ref 5.0–8.0)

## 2023-07-01 LAB — TYPE AND SCREEN
ABO/RH(D): A POS
Antibody Screen: NEGATIVE

## 2023-07-01 LAB — CBC
HCT: 41.2 % (ref 36.0–46.0)
Hemoglobin: 12.7 g/dL (ref 12.0–15.0)
MCH: 28.5 pg (ref 26.0–34.0)
MCHC: 30.8 g/dL (ref 30.0–36.0)
MCV: 92.4 fL (ref 80.0–100.0)
Platelets: 229 10*3/uL (ref 150–400)
RBC: 4.46 MIL/uL (ref 3.87–5.11)
RDW: 14.2 % (ref 11.5–15.5)
WBC: 10.8 10*3/uL — ABNORMAL HIGH (ref 4.0–10.5)
nRBC: 0 % (ref 0.0–0.2)

## 2023-07-01 LAB — COMPREHENSIVE METABOLIC PANEL
ALT: 28 U/L (ref 0–44)
AST: 18 U/L (ref 15–41)
Albumin: 3.9 g/dL (ref 3.5–5.0)
Alkaline Phosphatase: 97 U/L (ref 38–126)
Anion gap: 6 (ref 5–15)
BUN: 7 mg/dL (ref 6–20)
CO2: 27 mmol/L (ref 22–32)
Calcium: 8.8 mg/dL — ABNORMAL LOW (ref 8.9–10.3)
Chloride: 100 mmol/L (ref 98–111)
Creatinine, Ser: 1.03 mg/dL — ABNORMAL HIGH (ref 0.44–1.00)
GFR, Estimated: >60 mL/min (ref >60)
Glucose, Bld: 233 mg/dL — ABNORMAL HIGH (ref 70–99)
Potassium: 3.6 mmol/L (ref 3.5–5.1)
Sodium: 133 mmol/L — ABNORMAL LOW (ref 135–145)
Total Bilirubin: 0.6 mg/dL (ref 0.3–1.2)
Total Protein: 8.4 g/dL — ABNORMAL HIGH (ref 6.5–8.1)

## 2023-07-01 LAB — LIPASE, BLOOD: Lipase: 24 U/L (ref 11–51)

## 2023-07-01 MED ORDER — LIDOCAINE HCL (PF) 1 % IJ SOLN
5.0000 mL | Freq: Once | INTRAMUSCULAR | Status: AC
  Administered 2023-07-01: 5 mL
  Filled 2023-07-01: qty 5

## 2023-07-01 MED ORDER — CEPHALEXIN 500 MG PO CAPS: 500.0000 mg | ORAL_CAPSULE | Freq: Four times a day (QID) | ORAL | 0 refills | Status: AC

## 2023-07-01 MED ORDER — CEFTRIAXONE SODIUM 1 G IJ SOLR
500.0000 mg | Freq: Once | INTRAMUSCULAR | Status: AC
  Administered 2023-07-01: 500 mg via INTRAMUSCULAR
  Filled 2023-07-01: qty 10

## 2023-07-01 NOTE — Discharge Instructions (Addendum)
Your exam, labs, and CT scan overall reassuring.  The source of your bloody urine is a bladder infection.  You do not have any evidence of kidney stones or kidney infection.  Take the antibiotics as prescribed until all pills are gone.  Drink plenty of fluid and your bladder on schedule.  Follow-up with your primary provider for ongoing concerns.  Return to the ED if needed.

## 2023-07-01 NOTE — ED Triage Notes (Signed)
Pt to ED via [facility] from home. Pt reports approximately ago she used the bathroom and bright red blood was coming from her urethra and vagina. Pt reports hx of hysterectomy. Pt denies bleeding at this time. Pt denies blood thinners.

## 2023-07-01 NOTE — ED Provider Triage Note (Signed)
Emergency Medicine Provider Triage Evaluation Note  Alicia Mayo , a 58 y.o. female  was evaluated in triage.  Pt complains of BRB. She presents via EMS fro m home after going to the bathroom 30+ minutes PTA, with the urge to urinate. She noted BRB in the toilet. She is unclear of the source wether vaginal or rectal. She is s/p TAH. No blood thinner use. She now endorses some abdominal pain.   Review of Systems  Positive: BRB  Negative: FCS  Physical Exam  BP 127/77   Pulse 83   Temp 98.2 F (36.8 C)   Resp 18   SpO2 98%  Gen:   Awake, no distress  NAD Resp:  Normal effort CTA MSK:   Moves extremities without difficulty  Other:  Soft, nointender. Normal BS x 4  Medical Decision Making  Medically screening exam initiated at 3:21 PM.  Appropriate orders placed.  Alicia Mayo was informed that the remainder of the evaluation will be completed by another provider, this initial triage assessment does not replace that evaluation, and the importance of remaining in the ED until their evaluation is complete.  Patient to the ED for evaluation of BRB in the toilet. Unclear of source.    Lissa Hoard, PA-C 07/01/23 1523

## 2023-07-01 NOTE — ED Provider Notes (Signed)
[facility] Emergency Department Provider Note     Event Date/Time   First MD Initiated Contact with Patient 07/01/23 1621     (approximate)   History   Hematuria   HPI  Alicia Mayo is a 57 y.o. 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  Patient initially reported he was unclear of the source patient urge to urinate when she went to the bathroom, noting bright red blood in the toilet bowl.  She reports history of hysterectomy and denies any dysuria or urinary urgency.  She denies any current active bleeding at this time denies any blood thinner use.  She did endorse some abdominal cramping after the episode.   Physical Exam   Triage Vital Signs: ED Triage Vitals  Encounter Vitals Group     BP 07/01/23 1517 127/77     Systolic BP Percentile --      Diastolic BP Percentile --      Pulse Rate 07/01/23 1517 83     Resp 07/01/23 1517 18     Temp 07/01/23 1517 98.2 F (36.8 C)     Temp src --      SpO2 07/01/23 1517 98 %     Weight --      Height --      Head Circumference --      Peak Flow --      Pain Score 07/01/23 1514 0     Pain Loc --      Pain Education --      Exclude from Growth Chart --     Most recent vital signs: Vitals:   07/01/23 1517  BP: 127/77  Pulse: 83  Resp: 18  Temp: 98.2 F (36.8 C)  SpO2: 98%    General Awake, no distress. NAD HEENT NCAT. PERRL. EOMI. No rhinorrhea. Mucous membranes are moist.  CV:  Good peripheral perfusion. RRR RESP:  Normal effort. CTA ABD:  No distention. Soft, nontender   ED Results / Procedures / Treatments   Labs (all labs ordered are listed, but only abnormal results are displayed) Labs Reviewed  COMPREHENSIVE METABOLIC PANEL - Abnormal; Notable for the following components:      Result Value   Sodium 133 (*)    Glucose, Bld 233 (*)    Creatinine, Ser 1.03 (*)    Calcium  8.8 (*)    Total Protein 8.4 (*)    All other components within normal limits  CBC - Abnormal; Notable for the following components:   WBC 10.8 (*)    All other components within normal limits  URINALYSIS, ROUTINE W REFLEX MICROSCOPIC - Abnormal; Notable for the following components:   Color, Urine YELLOW (*)    APPearance CLOUDY (*)    Glucose, UA >=500 (*)    Hgb urine dipstick LARGE (*)    Leukocytes,Ua MODERATE (*)    Bacteria, UA MANY (*)    All other components within normal limits  URINE CULTURE  LIPASE, BLOOD  TYPE AND SCREEN    EKG   RADIOLOGY  I personally viewed and evaluated these images as part of my medical decision making, as well as reviewing the written report by the radiologist.  ED Provider Interpretation: no acute findings  CT Renal Stone Study  Result Date: 07/01/2023 CLINICAL DATA:  Gross hematuria. EXAM: CT ABDOMEN AND PELVIS WITHOUT CONTRAST TECHNIQUE: Multidetector CT imaging  of the abdomen and pelvis was performed following the standard protocol without IV contrast. RADIATION DOSE REDUCTION: This exam was performed according to the departmental dose-optimization program which includes automated exposure control, adjustment of the mA and/or kV according to patient size and/or use of iterative reconstruction technique. COMPARISON:  Mar 11, 2021. FINDINGS: Lower chest: No acute abnormality. Hepatobiliary: Probable hepatic steatosis. Status post cholecystectomy. No biliary dilatation. Pancreas: Unremarkable. No pancreatic ductal dilatation or surrounding inflammatory changes. Spleen: Normal in size without focal abnormality. Adrenals/Urinary Tract: Adrenal glands are unremarkable. Kidneys are normal, without renal calculi, focal lesion, or hydronephrosis. Bladder is unremarkable. Stomach/Bowel: Stomach is within normal limits. Appendix appears normal. No evidence of bowel wall thickening, distention, or inflammatory changes. Vascular/Lymphatic: No significant  vascular findings are present. No enlarged abdominal or pelvic lymph nodes. Reproductive: Status post hysterectomy. No adnexal masses. Other: No abdominal wall hernia or abnormality. No abdominopelvic ascites. Musculoskeletal: No acute or significant osseous findings. IMPRESSION: Probable hepatic steatosis. No other abnormality seen in the abdomen or pelvis. Electronically Signed   By: Lupita Raider M.D.   On: 07/01/2023 18:26     PROCEDURES:  Critical Care performed: No  Procedures   MEDICATIONS ORDERED IN ED: Medications  cefTRIAXone (ROCEPHIN) injection 500 mg (500 mg Intramuscular Given 07/01/23 1808)  lidocaine (PF) (XYLOCAINE) 1 % injection 5 mL (5 mLs Infiltration Given 07/01/23 1808)     IMPRESSION / MDM / ASSESSMENT AND PLAN / ED COURSE  I reviewed the triage vital signs and the nursing notes.                              Differential diagnosis includes, but is not limited to, UTI, kidney stones, gross hematuria, pyelonephritis, cystitis, hemorrhoids, abnormal vaginal bleeding  Patient's presentation is most consistent with acute complicated illness / injury requiring diagnostic workup.   Patient's diagnosis is consistent with UTI with gross hematuria.  UA confirms prominent bacteriuria and moderate RBCs.  Remaining labs otherwise overall reassuring.  No significant leukocytosis noted.  Urine culture is processing at this time.  Patient is stable afebrile, and able to spontaneously void without evidence of sepsis or pyelonephritis.  Patient will be discharged home with prescriptions for Keflex. Patient is to follow up with PCP as needed or otherwise directed. Patient is given ED precautions to return to the ED for any worsening or new symptoms.   FINAL CLINICAL IMPRESSION(S) / ED DIAGNOSES   Final diagnoses:  Gross hematuria  Urinary tract infection with hematuria, site unspecified     Rx / DC Orders   ED Discharge Orders          Ordered    cephALEXin (KEFLEX) 500  MG capsule  4 times daily        07/01/23 1844             Note:  This document was prepared using Dragon voice recognition software and may include unintentional dictation errors.    Lissa Hoard, PA-C 07/01/23 1942    Shaune Pollack, MD 07/01/23 2200

## 2023-07-01 NOTE — ED Triage Notes (Signed)
First Nurse Note: Patient to ED via [facility] from home for blood in urine. Started approx 20 minutes ago- states going from urethra and vagina.  Hx of hysterectomy and cancer.

## 2023-07-03 LAB — URINE CULTURE: Culture: >=100000 — AB

## 2023-11-29 DIAGNOSIS — G4730 Sleep apnea, unspecified: Secondary | ICD-10-CM | POA: Insufficient documentation

## 2024-03-10 ENCOUNTER — Emergency Department

## 2024-03-10 ENCOUNTER — Emergency Department
Admission: EM | Admit: 2024-03-10 | Discharge: 2024-03-10 | Disposition: A | Discharge status: Home / Self Care | Attending: Emergency Medicine | Admitting: Emergency Medicine

## 2024-03-10 ENCOUNTER — Other Ambulatory Visit: Payer: Self-pay

## 2024-03-10 DIAGNOSIS — R440 Auditory hallucinations: Secondary | ICD-10-CM | POA: Insufficient documentation

## 2024-03-10 DIAGNOSIS — N3000 Acute cystitis without hematuria: Secondary | ICD-10-CM | POA: Insufficient documentation

## 2024-03-10 DIAGNOSIS — R443 Hallucinations, unspecified: Secondary | ICD-10-CM

## 2024-03-10 DIAGNOSIS — R441 Visual hallucinations: Secondary | ICD-10-CM | POA: Insufficient documentation

## 2024-03-10 LAB — URINALYSIS, ROUTINE W REFLEX MICROSCOPIC
Bilirubin Urine: NEGATIVE
Glucose, UA: >=500 mg/dL — AB
Hgb urine dipstick: NEGATIVE
Ketones, ur: NEGATIVE mg/dL
Nitrite: NEGATIVE
Protein, ur: NEGATIVE mg/dL
Specific Gravity, Urine: 1.008 (ref 1.005–1.030)
WBC, UA: >50 WBC/hpf (ref 0–5)
pH: 5 (ref 5.0–8.0)

## 2024-03-10 LAB — URINE DRUG SCREEN, QUALITATIVE (ARMC ONLY)
Amphetamines, Ur Screen: NOT DETECTED
Barbiturates, Ur Screen: NOT DETECTED
Benzodiazepine, Ur Scrn: NOT DETECTED
Cannabinoid 50 Ng, Ur ~~LOC~~: NOT DETECTED
Cocaine Metabolite,Ur ~~LOC~~: NOT DETECTED
MDMA (Ecstasy)Ur Screen: NOT DETECTED
Methadone Scn, Ur: NOT DETECTED
Opiate, Ur Screen: NOT DETECTED
Phencyclidine (PCP) Ur S: NOT DETECTED
Tricyclic, Ur Screen: POSITIVE — AB

## 2024-03-10 LAB — COMPREHENSIVE METABOLIC PANEL WITH GFR
ALT: 23 U/L (ref 0–44)
AST: 28 U/L (ref 15–41)
Albumin: 3.7 g/dL (ref 3.5–5.0)
Alkaline Phosphatase: 97 U/L (ref 38–126)
Anion gap: 9 (ref 5–15)
BUN: 12 mg/dL (ref 6–20)
CO2: 26 mmol/L (ref 22–32)
Calcium: 8.8 mg/dL — ABNORMAL LOW (ref 8.9–10.3)
Chloride: 99 mmol/L (ref 98–111)
Creatinine, Ser: 1.12 mg/dL — ABNORMAL HIGH (ref 0.44–1.00)
GFR, Estimated: 58 mL/min — ABNORMAL LOW (ref >60)
Glucose, Bld: 163 mg/dL — ABNORMAL HIGH (ref 70–99)
Potassium: 3.4 mmol/L — ABNORMAL LOW (ref 3.5–5.1)
Sodium: 134 mmol/L — ABNORMAL LOW (ref 135–145)
Total Bilirubin: 0.7 mg/dL (ref 0.0–1.2)
Total Protein: 7.6 g/dL (ref 6.5–8.1)

## 2024-03-10 LAB — CBC
HCT: 35.4 % — ABNORMAL LOW (ref 36.0–46.0)
Hemoglobin: 11.4 g/dL — ABNORMAL LOW (ref 12.0–15.0)
MCH: 30.1 pg (ref 26.0–34.0)
MCHC: 32.2 g/dL (ref 30.0–36.0)
MCV: 93.4 fL (ref 80.0–100.0)
Platelets: 253 10*3/uL (ref 150–400)
RBC: 3.79 MIL/uL — ABNORMAL LOW (ref 3.87–5.11)
RDW: 13.4 % (ref 11.5–15.5)
WBC: 11.4 10*3/uL — ABNORMAL HIGH (ref 4.0–10.5)
nRBC: 0 % (ref 0.0–0.2)

## 2024-03-10 LAB — ETHANOL: Alcohol, Ethyl (B): <15 mg/dL (ref <15)

## 2024-03-10 MED ORDER — SODIUM CHLORIDE 0.9 % IV SOLN
1.0000 g | INTRAVENOUS | Status: AC
  Administered 2024-03-10: 1 g via INTRAVENOUS
  Filled 2024-03-10: qty 10

## 2024-03-10 MED ORDER — CEFADROXIL 500 MG PO CAPS: 500.0000 mg | ORAL_CAPSULE | Freq: Two times a day (BID) | ORAL | 0 refills | Status: AC

## 2024-03-10 MED ORDER — LACTATED RINGERS IV BOLUS: 1000.0000 mL | Freq: Once | INTRAVENOUS | Status: DC

## 2024-03-10 MED ORDER — SODIUM CHLORIDE 0.9 % IV BOLUS
1000.0000 mL | Freq: Once | INTRAVENOUS | Status: AC
  Administered 2024-03-10: 1000 mL via INTRAVENOUS

## 2024-03-10 NOTE — ED Notes (Addendum)
 First nurse note: Pt to ED via [facility] from home c/o hallucinations x several weeks  140/82 100HR 99%RA 16RR CBG 179

## 2024-03-10 NOTE — Discharge Instructions (Addendum)
 It sounds as if you may have 2 things going on.  You have a urinary tract infection which we treated with antibiotics and for which we wrote you a prescription and sent to your [facility] in [place].  Please complete the full course of treatment.  These types of infections can cause other underlying symptoms to be worse.  It also sounds as if you have not been taking the medication prescribed by your psychiatrist for your auditory and visual hallucinations.  Please start back on your medication and call your psychiatrist this morning to schedule the next available follow-up appointment.  There is no indication that you need to be hospitalized at this time, but if you develop any new or worsening symptoms, including thoughts of harming yourself or others, please return immediately to the nearest emergency department.

## 2024-03-10 NOTE — ED Triage Notes (Signed)
 Patient C/O auditory and visual hallucinations that began two days ago. Patient states that she does not have a history of such. Patient denies SI and HI at this time.

## 2024-03-10 NOTE — ED Notes (Signed)
  Patient notified of discharge and calling family to come pick her up.

## 2024-03-10 NOTE — ED Notes (Signed)
 Pt ambulated to and from bathroom, no assistance required.

## 2024-03-10 NOTE — ED Provider Notes (Signed)
 [facility] Provider Note    Event Date/Time   First MD Initiated Contact with Patient 03/10/24 0103     (approximate)   History   Hallucinations   HPI Alicia Mayo is a 58 y.o. female who presents for evaluation of worsening auditory visual hallucinations.  The patient states that they have happened to her in the past and that she has a psychiatrist that she sees regularly.  The psychiatrist has prescribed medication for her, but she stopped taking it a while ago because her brother told her it was not helping her.  Since that time the hallucinations have come back.  She said that they are not telling her to do anything and she is not having any thoughts of harming herself or anyone else.  She has not had any recent medical concerns such as fever, chest pain, shortness of breath, nausea, vomiting, nor dysuria.  She is not taking any other new medications.  She denies drug and alcohol use.     Physical Exam   Triage Vital Signs: ED Triage Vitals  Encounter Vitals Group     BP 03/10/24 0033 111/74     Systolic BP Percentile --      Diastolic BP Percentile --      Pulse Rate 03/10/24 0033 (!) 102     Resp 03/10/24 0033 18     Temp 03/10/24 0033 98.4 F (36.9 C)     Temp Source 03/10/24 0033 Oral     SpO2 03/10/24 0033 98 %     Weight 03/10/24 0034 70.8 kg (156 lb)     Height 03/10/24 0034 1.702 m (5\' 7" )     Head Circumference --      Peak Flow --      Pain Score 03/10/24 0106 4     Pain Loc --      Pain Education --      Exclude from Growth Chart --     Most recent vital signs: Vitals:   03/10/24 0033  BP: 111/74  Pulse: (!) 102  Resp: 18  Temp: 98.4 F (36.9 C)  SpO2: 98%    General: Sleeping but awakens easily and communicates with me without difficulty.  Dry lips and mucous membranes, otherwise generally well-appearing. CV:  Good peripheral perfusion.  Normal heart sounds, initial borderline tachycardia, resolved after  fluids. Resp:  Normal effort. Speaking easily and comfortably, no accessory muscle usage nor intercostal retractions.  Lungs clear to auscultation. Abd:  No distention.  No tenderness to palpation of the abdomen. Other:  Patient maintains eye contact during conversation, seems to show good insight and judgment into her situation.  She is a bit sleepy when talking with me so requires some prompting but she is not exhibiting any signs of responding to internal stimuli and is not reporting any SI nor HI.   ED Results / Procedures / Treatments   Labs (all labs ordered are listed, but only abnormal results are displayed) Labs Reviewed  COMPREHENSIVE METABOLIC PANEL WITH GFR - Abnormal; Notable for the following components:      Result Value   Sodium 134 (*)    Potassium 3.4 (*)    Glucose, Bld 163 (*)    Creatinine, Ser 1.12 (*)    Calcium 8.8 (*)    GFR, Estimated 58 (*)    All other components within normal limits  CBC - Abnormal; Notable for the following components:   WBC 11.4 (*)    RBC 3.79 (*)  Hemoglobin 11.4 (*)    HCT 35.4 (*)    All other components within normal limits  URINE DRUG SCREEN, QUALITATIVE ([facility] ONLY) - Abnormal; Notable for the following components:   Tricyclic, Ur Screen POSITIVE (*)    All other components within normal limits  URINALYSIS, ROUTINE W REFLEX MICROSCOPIC - Abnormal; Notable for the following components:   Color, Urine YELLOW (*)    APPearance CLOUDY (*)    Glucose, UA >=500 (*)    Leukocytes,Ua LARGE (*)    Bacteria, UA MANY (*)    All other components within normal limits  URINE CULTURE  ETHANOL     RADIOLOGY I independently viewed and interpreted the patient's head CT.  I see no evidence of acute intracranial bleed nor neoplasm.  I also read the radiologist's report, which confirmed no acute findings.   PROCEDURES:  Critical Care performed: No  Procedures    IMPRESSION / MDM / ASSESSMENT AND PLAN / ED COURSE  I reviewed the  triage vital signs and the nursing notes.                              Differential diagnosis includes, but is not limited to, underlying psychiatric illness including schizophrenia or schizoaffective disorder, urinary tract infection or other acute infectious process which is causing a recrudescence of psychiatric symptoms, medication or drug side effect, nonadherence to medication regimen.  Patient's presentation is most consistent with acute presentation with potential threat to life or bodily function.  Labs/studies ordered: As per protocol, I ordered the following labs as part of the patient's medical and psychiatric evaluation:  CBC, CMP, ethanol level, acetaminophen level, salicylate level, urine drug screen.  Also obtain urinalysis and urine culture.  Interventions/Medications given:  Medications  cefTRIAXone  (ROCEPHIN ) 1 g in sodium chloride 0.9 % 100 mL IVPB (1 g Intravenous New Bag/Given 03/10/24 0421)  sodium chloride 0.9 % bolus 1,000 mL (1,000 mLs Intravenous New Bag/Given 03/10/24 0420)    (Note:  hospital course my include additional interventions and/or labs/studies not listed above.)   Patient is calm and cooperative, stable for 4 and half hours in the emergency department.  She has a generally reassuring exam other than being slightly volume depleted with a mild elevation of her creatinine of 1.12 and a mild leukocytosis of 11.4.  Her urinalysis is strongly positive with many bacteria, large leukocytes, and greater than 50 WBCs.  I strongly suspect that her UTI is causing a worsening of what she describes as chronic psychiatric symptoms.  In addition, she has stopped taking her psychiatric medicine on the advice of her brother.  I offered her a psychiatric consult tonight, but explained that it is also reasonable to treat her UTI and have her follow-up at the next available opportunity with her psychiatrist.  I also explained several times that she should start taking her  psychiatric medicine again as soon as possible.  She states that she understands and that is her preference, to be treated and follow-up as an outpatient.  I think this is reasonable given that the patient has decision-making capacity and has no emergent psychiatric signs or symptoms that require involuntary commitment nor inpatient treatment.  Prescription as listed below and I gave strict return precautions and follow-up recommendations.       FINAL CLINICAL IMPRESSION(S) / ED DIAGNOSES   Final diagnoses:  Acute cystitis without hematuria  Hallucinations     Rx / DC Orders  ED Discharge Orders     None        Note:  This document was prepared using Dragon voice recognition software and may include unintentional dictation errors.   Lynnda Sas, MD 03/10/24 [phone]

## 2024-03-12 LAB — URINE CULTURE: Culture: >=100000 — AB

## 2024-05-20 ENCOUNTER — Emergency Department
Admission: EM | Admit: 2024-05-20 | Discharge: 2024-05-20 | Disposition: A | Discharge status: Home / Self Care | Attending: Emergency Medicine | Admitting: Emergency Medicine

## 2024-05-20 ENCOUNTER — Other Ambulatory Visit: Payer: Self-pay

## 2024-05-20 DIAGNOSIS — R600 Localized edema: Secondary | ICD-10-CM | POA: Diagnosis not present

## 2024-05-20 DIAGNOSIS — M797 Fibromyalgia: Secondary | ICD-10-CM | POA: Diagnosis not present

## 2024-05-20 DIAGNOSIS — I509 Heart failure, unspecified: Secondary | ICD-10-CM | POA: Diagnosis not present

## 2024-05-20 DIAGNOSIS — M7989 Other specified soft tissue disorders: Secondary | ICD-10-CM | POA: Diagnosis present

## 2024-05-20 DIAGNOSIS — I110 Hypertensive heart disease with heart failure: Secondary | ICD-10-CM | POA: Diagnosis not present

## 2024-05-20 DIAGNOSIS — E119 Type 2 diabetes mellitus without complications: Secondary | ICD-10-CM | POA: Insufficient documentation

## 2024-05-20 HISTORY — DX: Chronic obstructive pulmonary disease, unspecified: J44.9

## 2024-05-20 HISTORY — DX: Unspecified asthma, uncomplicated: J45.909

## 2024-05-20 HISTORY — DX: Fibromyalgia: M79.7

## 2024-05-20 LAB — CBC WITH DIFFERENTIAL/PLATELET
Abs Immature Granulocytes: 0.03 K/uL (ref 0.00–0.07)
Basophils Absolute: 0 K/uL (ref 0.0–0.1)
Basophils Relative: 0 %
Eosinophils Absolute: 0.1 K/uL (ref 0.0–0.5)
Eosinophils Relative: 1 %
HCT: 34.1 % — ABNORMAL LOW (ref 36.0–46.0)
Hemoglobin: 10.9 g/dL — ABNORMAL LOW (ref 12.0–15.0)
Immature Granulocytes: 0 %
Lymphocytes Relative: 42 %
Lymphs Abs: 3.6 K/uL (ref 0.7–4.0)
MCH: 29.5 pg (ref 26.0–34.0)
MCHC: 32 g/dL (ref 30.0–36.0)
MCV: 92.4 fL (ref 80.0–100.0)
Monocytes Absolute: 0.4 K/uL (ref 0.1–1.0)
Monocytes Relative: 4 %
Neutro Abs: 4.4 K/uL (ref 1.7–7.7)
Neutrophils Relative %: 53 %
Platelets: 196 K/uL (ref 150–400)
RBC: 3.69 MIL/uL — ABNORMAL LOW (ref 3.87–5.11)
RDW: 13.2 % (ref 11.5–15.5)
WBC: 8.5 K/uL (ref 4.0–10.5)
nRBC: 0 % (ref 0.0–0.2)

## 2024-05-20 LAB — BASIC METABOLIC PANEL WITH GFR
Anion gap: 8 (ref 5–15)
BUN: 7 mg/dL (ref 6–20)
CO2: 31 mmol/L (ref 22–32)
Calcium: 9.1 mg/dL (ref 8.9–10.3)
Chloride: 99 mmol/L (ref 98–111)
Creatinine, Ser: 0.76 mg/dL (ref 0.44–1.00)
GFR, Estimated: >60 mL/min (ref >60)
Glucose, Bld: 320 mg/dL — ABNORMAL HIGH (ref 70–99)
Potassium: 3.3 mmol/L — ABNORMAL LOW (ref 3.5–5.1)
Sodium: 138 mmol/L (ref 135–145)

## 2024-05-20 LAB — BRAIN NATRIURETIC PEPTIDE: B Natriuretic Peptide: 25.6 pg/mL (ref 0.0–100.0)

## 2024-05-20 MED ORDER — POTASSIUM CHLORIDE CRYS ER 20 MEQ PO TBCR: 20.0000 meq | EXTENDED_RELEASE_TABLET | Freq: Two times a day (BID) | ORAL | 0 refills | Status: DC

## 2024-05-20 MED ORDER — FUROSEMIDE 20 MG PO TABS: 20.0000 mg | ORAL_TABLET | Freq: Every day | ORAL | 0 refills | Status: DC

## 2024-05-20 MED ORDER — PREGABALIN 50 MG PO CAPS
150.0000 mg | ORAL_CAPSULE | Freq: Once | ORAL | Status: AC
  Administered 2024-05-20: 150 mg via ORAL
  Filled 2024-05-20: qty 3

## 2024-05-20 MED ORDER — FUROSEMIDE 40 MG PO TABS
40.0000 mg | ORAL_TABLET | Freq: Once | ORAL | Status: AC
  Administered 2024-05-20: 40 mg via ORAL
  Filled 2024-05-20: qty 1

## 2024-05-20 NOTE — ED Triage Notes (Signed)
 Pt to ED for swelling to hands, thighs for 1 week. States also to throat but pt has no visible swelling and voice is clear. Pt denies new meds or foods. No rashes. Also generalized pain, chronic, but worse since last 1-2 weeks.

## 2024-05-20 NOTE — Discharge Instructions (Signed)
 I have sent fluid pills to your pharmacy to take for the next 2 weeks until you see your primary care provider.  I have also sent potassium repletion for you to take every day with this as well as it can make your potassium go low.  Please return for any severe or worsening symptoms.

## 2024-05-20 NOTE — ED Notes (Signed)
 Pt verbalizes understanding of discharge instructions.

## 2024-05-20 NOTE — ED Provider Notes (Signed)
 [facility] Provider Note    Event Date/Time   First MD Initiated Contact with Patient 05/20/24 1128     (approximate)   History   Edema and generalized pain   HPI Alicia Mayo is a 58 y.o. female with history of fibromyalgia, bipolar 2, HTN, DM2 presenting today for generalized pain and leg swelling.  Patient has chronic history with chronic pain as well as history of CHF.  She reports she has been out of her Lasix  for several weeks now.  She has noted increased swelling to her legs which is causing worsening pain.  She denies any other specific acute symptoms such as cough, shortness of breath, difficulty ambulating, chest pain, abdominal pain.  Chart review: Reviewed her recent notes from her pain clinic at [facility]     Physical Exam   Triage Vital Signs: ED Triage Vitals  Encounter Vitals Group     BP 05/20/24 1113 126/80     Girls Systolic BP Percentile --      Girls Diastolic BP Percentile --      Boys Systolic BP Percentile --      Boys Diastolic BP Percentile --      Pulse Rate 05/20/24 1113 93     Resp 05/20/24 1113 20     Temp 05/20/24 1113 98 F (36.7 C)     Temp Source 05/20/24 1113 Oral     SpO2 05/20/24 1113 100 %     Weight 05/20/24 1114 215 lb (97.5 kg)     Height 05/20/24 1114 5' 7 (1.702 m)     Head Circumference --      Peak Flow --      Pain Score 05/20/24 1110 10     Pain Loc --      Pain Education --      Exclude from Growth Chart --     Most recent vital signs: Vitals:   05/20/24 1113  BP: 126/80  Pulse: 93  Resp: 20  Temp: 98 F (36.7 C)  SpO2: 100%   I have reviewed the vital signs. General:  Awake, alert, no acute distress. Head:  Normocephalic, Atraumatic. EENT:  PERRL, EOMI, Oral mucosa pink and moist, Neck is supple. Cardiovascular: Regular rate, 2+ distal pulses. Respiratory:  Normal respiratory effort, symmetrical expansion, no distress.   Extremities:  Moving all four extremities through full ROM  without pain.  1+ pitting edema to bilateral lower extremities. Neuro:  Alert and oriented.  Interacting appropriately.   Skin:  Warm, dry, no rash.   Psych: Appropriate affect.    ED Results / Procedures / Treatments   Labs (all labs ordered are listed, but only abnormal results are displayed) Labs Reviewed  CBC WITH DIFFERENTIAL/PLATELET - Abnormal; Notable for the following components:      Result Value   RBC 3.69 (*)    Hemoglobin 10.9 (*)    HCT 34.1 (*)    All other components within normal limits  BASIC METABOLIC PANEL WITH GFR - Abnormal; Notable for the following components:   Potassium 3.3 (*)    Glucose, Bld 320 (*)    All other components within normal limits  BRAIN NATRIURETIC PEPTIDE     EKG    RADIOLOGY    PROCEDURES:  Critical Care performed: No  Procedures   MEDICATIONS ORDERED IN ED: Medications  furosemide  (LASIX ) tablet 40 mg (has no administration in time range)  pregabalin  (LYRICA ) capsule 150 mg (has no administration in time range)  IMPRESSION / MDM / ASSESSMENT AND PLAN / ED COURSE  I reviewed the triage vital signs and the nursing notes.                              Differential diagnosis includes, but is not limited to, pitting edema secondary to CHF, fibromyalgia, electrolyte abnormality, dehydration  Patient's presentation is most consistent with acute complicated illness / injury requiring diagnostic workup.  Patient is a 58 year old female with history of fibromyalgia and CHF presenting today for bilateral leg edema with worsening chronic pain symptoms.  Vital signs are stable and exam most notable for 1+ pitting edema to lower extremities.  She has been out of her Lasix  for several weeks and suspect symptoms all likely secondary to this.  Will give dose of Lasix  as well as her home pregabalin  for pain control and swelling.  CBC consistent with her baseline anemia.  BMP otherwise reassuring with mild hyperglycemia but no other  acute pathology.  BNP also reassuring.  Patient was reassessed before getting medications and is asleep with no ongoing pain symptoms.  She is stable for discharge at this time with no other concerning findings and sent 2 weeks of 20 mg p.o. Lasix  as well as potassium repletion to her pharmacy.  Told to follow-up with PCP and given strict return precautions.     FINAL CLINICAL IMPRESSION(S) / ED DIAGNOSES   Final diagnoses:  Lower extremity edema  Fibromyalgia     Rx / DC Orders   ED Discharge Orders          Ordered    furosemide  (LASIX ) 20 MG tablet  Daily        05/20/24 1219    potassium chloride  SA (KLOR-CON  M) 20 MEQ tablet  2 times daily        05/20/24 1219             Note:  This document was prepared using Dragon voice recognition software and may include unintentional dictation errors.   Malvina Alm DASEN, MD 05/20/24 [phone]

## 2024-06-09 NOTE — ED Provider Notes (Addendum)
 [facility] EMERGENCY DEPARTMENT  ED Provider Note History   Chief Complaint  Patient presents with  . Hallucinations   History of Present Illness Alicia Mayo is a 58 y.o. female with PMH anxiety, arthritis, asthma, bipolar disorder, COPD, type 2 diabetes, fibromyalgia, GERD, hepatitis, hypertension, hyperlipidemia, PTSD, seizures, stroke who presents to the ED for evaluation of accidental fall that occurred 2 days ago.  Patient states that she was bending over to grab something and fell striking her right side.  Believes she may have hit her head.  Has neck pain, upper back pain, right sided pain.  Also with increasing hallucinations that have been worse over the past few weeks.  Patient currently denying SI or HI.  Continues to speak with people who are not there.  Of note, patient has been ambulatory since her accident. Past Medical History:  Diagnosis Date  . Allergen injection reaction   . Anxiety   . Arthritis    RA, DDD,OA  . Asthma without status asthmaticus (HHS-HCC)   . Bipolar 2 disorder (CMS/HHS-HCC)   . Bleeding disorder ()    told years ago bleeding disorder. tx not being followed now  . BMI 34.0-34.9,adult 06/17/2020  . Chest pain 06/17/2020  . COPD (chronic obstructive pulmonary disease) (CMS/HHS-HCC)   . Diabetes mellitus type 2, uncomplicated (CMS/HHS-HCC)   . Fibromyalgia   . GERD (gastroesophageal reflux disease)   . Hemorrhoid 2008   per sigmoidoscopy  . Hepatitis    NA[facility]  . History of anesthesia reaction    took 3 days to wake up  following cholecystectomy  . Hyperlipidemia, acquired   . Hypertension   . Onychomycosis   . PONV (postoperative nausea and vomiting)    with multiple surgeries - on promethazine and ondansetron  daily  . Poor intravenous access    hard to find veins  . PTSD (post-traumatic stress disorder)   . Seizures (CMS/HHS-HCC)    last 2018  . Sleep apnea 11/29/2023  . Stroke (CMS/HHS-HCC) 2011/2013   affected right side and memory; has to  use a walker  . Urinary incontinence   . Vision abnormalities    Past Surgical History:  Procedure Laterality Date  . CHOLECYSTECTOMY  1994  . ARTHROSCOPIC ROTATOR CUFF REPAIR Right 1997   Right  . COLONOSCOPY W/BIOPSY N/A 06/09/2018   Procedure: COLONOSCOPY, FLEXIBLE; WITH BIOPSY, SINGLE OR MULTIPLE;  Surgeon: Nola Tobias Brunt, MD;  Location: [facility];  Service: Gastroenterology;  Laterality: N/A;  . COLONOSCOPY W/REMOVAL LESIONS BY SNARE N/A 06/09/2018   Procedure: COLONOSCOPY, FLEXIBLE; WITH REMOVAL OF TUMOR(S), POLYP(S), OR OTHER LESION(S) BY SNARE TECHNIQUE;  Surgeon: Nola Tobias Brunt, MD;  Location: [facility];  Service: Gastroenterology;  Laterality: N/A;  . INCISION & DRAINAGE VULVA/PERINEAL ABSCESS N/A 08/02/2019   Procedure: INCISION AND DRAINAGE VULVA OR PERINEAL ABSCESS;  Surgeon: Zackery Toya Helling, MD;  Location: [facility];  Service: Gynecology;  Laterality: N/A;  . UNLISTED PROCEDURE NERVOUS SYSTEM N/A 12/07/2019   Procedure: Ganglion of Impar Block;  Surgeon: Etta Lot, MD;  [facility];  Service: Anes/ Pain Mgmt;  Laterality: N/A;  . INTRAOPERATIVE FLUOROSCOPY N/A 08/09/2020   Procedure: FLUOROSCOPIC GUIDANCE FOR NEEDLE PLACEMENT (EG, BIOPSY, ASPIRATION, INJECTION, LOCALIZATION DEVICE) (LIST SEPARATELY IN ADDITION TO CODE FOR PRIMARY PROCEDURE);  Surgeon: Etta Lot, MD;  [facility];  Service: Anes/ Pain Mgmt;  Laterality: N/A;  . PERCUTANEOUS TENOTOMY SINGLE EACH DIGIT Left 12/30/2020   Procedure: L- (x3)  TENDON SHEATH INCISION (EG, FOR TRIGGER FINGER);  Surgeon: Klifto, Christopher Scott, MD;  Location: [facility];  Service: Orthopedics;  Laterality: Left;  . INTRAOPERATIVE FLUOROSCOPY N/A 02/20/2021   Procedure: FLUOROSCOPIC GUIDANCE FOR NEEDLE PLACEMENT (EG, BIOPSY, ASPIRATION, INJECTION, LOCALIZATION DEVICE) (LIST SEPARATELY IN ADDITION TO  CODE FOR PRIMARY PROCEDURE);  Surgeon: Etta Lot, MD;  [facility];  Service: Anes/ Pain Mgmt;  Laterality: N/A;  . COLONOSCOPY N/A 12/10/2023   Procedure: COLORECTAL CANCER SCREENING; COLONOSCOPY ON INDIVIDUAL AT HIGH RISK;  Surgeon: Fernand Gregory List, MD;  Location: [facility];  Service: Gastroenterology;  Laterality: N/A;  . COLONOSCOPY W/REMOVAL LESIONS BY SNARE N/A 06/06/2024   Procedure: COLONOSCOPY, FLEXIBLE; WITH REMOVAL OF TUMOR(S), POLYP(S), OR OTHER LESION(S) BY SNARE TECHNIQUE;  Surgeon: Dinani, Amreen, MD;  Location: [facility];  Service: Gastroenterology;  Laterality: N/A;  . APPENDECTOMY    . HYSTERECTOMY    . OOPHORECTOMY     due to cancer cells   Family History  Problem Relation Age of Onset  . Thyroid disease Mother   . High blood pressure (Hypertension) Mother   . Glaucoma Mother   . Cervical cancer Mother 37  . Uterine cancer Mother   . Ovarian cancer Mother   . Bipolar disorder Mother   . Diabetes Mother   . High blood pressure (Hypertension) Father   . Bone cancer Father 62  . Thyroid disease Sister   . High blood pressure (Hypertension) Sister   . Glaucoma Sister   . Heart disease Sister   . Myocardial Infarction (Heart attack) Sister 10       pacemaker  . Diabetes Sister   . High blood pressure (Hypertension) Sister   . Glaucoma Sister   . Stroke Sister   . Aneurysm Sister   . Myocardial Infarction (Heart attack) Sister 39  . Diabetes Sister   . High blood pressure (Hypertension) Brother   . Glaucoma Brother   . High blood pressure (Hypertension) Brother   . Glaucoma Brother   . High blood pressure (Hypertension) Maternal Grandmother   . Glaucoma Maternal Grandmother   . Breast cancer Maternal Grandmother   . Cervical cancer Maternal Grandmother   . Uterine cancer Maternal Grandmother   . Ovarian cancer Maternal Grandmother   . High blood pressure (Hypertension) Maternal  Grandfather   . High blood pressure (Hypertension) Paternal Grandmother   . High blood pressure (Hypertension) Paternal Grandfather   . Schizophrenia Child   . High blood pressure (Hypertension) Maternal Aunt   . Glaucoma Maternal Aunt   . Breast cancer Maternal Aunt   . Cervical cancer Maternal Aunt   . Uterine cancer Maternal Aunt   . Ovarian cancer Maternal Aunt   . Diabetes Maternal Aunt   . High blood pressure (Hypertension) Maternal Aunt   . Glaucoma Maternal Aunt   . Bipolar disorder Maternal Aunt   . Diabetes Maternal Aunt   . High blood pressure (Hypertension) Maternal Uncle   . High blood pressure (Hypertension) Paternal Aunt   . Glaucoma Paternal Aunt   . High blood pressure (Hypertension) Paternal Uncle   . Breast cancer Cousin   . Pancreatic cancer Cousin   . Liver disease Neg Hx   . Macular degeneration Neg Hx   . Melanoma Neg Hx   . Anesthesia problems Neg Hx    Social History   Socioeconomic History  . Marital status: Divorced  . Number of children: 6  . Years of education: 6  .  Highest education level: Associate degree: academic program  Occupational History  . Occupation: disabilty  Tobacco Use  . Smoking status: Every Day    Current packs/day: 1.50    Average packs/day: 1.5 packs/day for 38.0 years (57.0 ttl pk-yrs)    Types: Cigarettes    Passive exposure: Never  . Smokeless tobacco: Never  . Tobacco comments:    Currently smoking 1.5 packs per day - declines smoking cessation consult - has tried multiple medications that she cannot tolerate   Vaping Use  . Vaping status: Former  Substance and Sexual Activity  . Alcohol use: Not Currently  . Drug use: No  . Sexual activity: Defer  Social History Narrative   Single. Divorced. 8 children- 6 living-4 daughters, 2 sons.  She did not raise all of them.  States the sons grew up in [place].  One of her daughters, Dwyane Lesches, has been causing a lot of stress lately going to her house in the middle  of the night, wanting her to keep the two young grandchildren.  Same daughter broke into her house  Recently (11/2018) looking for medications.     Used to work in Audiological scientist, nursing, Freight forwarder and house cleaning.   No blood transfusions. One tattoo. Ear piercings and tongue piercings.   Social Drivers of Corporate investment banker Strain: Low Risk  (08/31/2023)   Overall Financial Resource Strain (CARDIA)   . Difficulty of Paying Living Expenses: Not very hard  Food Insecurity: Food Insecurity Present (08/31/2023)   Hunger Vital Sign   . Worried About Programme researcher, broadcasting/film/video in the Last Year: Sometimes true   . Ran Out of Food in the Last Year: Sometimes true  Transportation Needs: No Transportation Needs (08/31/2023)   PRAPARE - Transportation   . Lack of Transportation (Medical): No   . Lack of Transportation (Non-Medical): No  Physical Activity: Inactive (11/03/2022)   Exercise Vital Sign   . Days of Exercise per Week: 0 days   . Minutes of Exercise per Session: 0 min  Stress: No Stress Concern Present (11/03/2022)   [employer] of Occupational Health - Occupational Stress Questionnaire   . Feeling of Stress : Only a little  Social Connections: Socially Isolated (11/03/2022)   Social Connection and Isolation Panel   . Frequency of Communication with Friends and Family: More than three times a week   . Frequency of Social Gatherings with Friends and Family: Twice a week   . Attends Religious Services: Never   . Active Member of Clubs or Organizations: No   . Attends Banker Meetings: Never   . Marital Status: Divorced  Housing Stability: Low Risk  (03/31/2024)   Housing Stability Vital Sign   . Unable to Pay for Housing in the Last Year: No   . Number of Times Moved in the Last Year: 0   . Homeless in the Last Year: No    Review of Systems: All other systems negative except as noted in HPI.  Physical Exam  BP (!) 156/83 (BP Location: Right upper arm, Patient  Position: Sitting)   Pulse 91   Temp 36.7 C (98.1 F) (Oral)   Resp 16   LMP  (LMP Unknown)   SpO2 100%  Gen: obese female appears in mod distress; Accompanied by nobody into the ED.  Head: Normocephalic, o/w unremarkable Eyes:  Pupils equally round and reactive, extraocular movements intact  ENT: Ears: hearing grossly intact; Mouth: MMM Respiratory: Normal respiratory effort, clear  to ausculation, no wheezing, no rales CV: RRR, no murmur, extremities well perfused GI: Soft, non-tender, normal bowel sounds, no guarding or rebound, no pulasatile masses, no palpable organomegaly  Neuro: CN II-XII intact, motor grossly intact, sensation intact throughout, speech normal, follows commands Psych: Alert, awake and oriented X3, no SI or HI, has visual hallucinations Musculoskeletal: Full ROM to all extremities, ttp to c/t spine, no l-spine ttp; ttp to right shoulder/right hip/right ribs/right thigh; 2+ radial and DP pulses Skin: no rash, no lesions, warm to palpation   Procedures  Procedures TIP (no need to delete)  Delete the Procedures section if patient does not have a procedure.  Medical Decision Making and ED Course  Given the patient's initial exam and evaluation, the following diagnostic evaluation has been ordered. The patient will be placed in the appropriate treatment space, once one is available to complete the evaluation and treatment. I have discussed the plan of care with the patient/representative and have advised that the plan of care may change based upon the results of diagnostic testing. The patient/representative has been asked to not leave prior to the completion of their evaluation. I have advised that if they leave prior to the completion of their evaluation, they will be leaving against medical advice.  58 year old female presenting today after a fall and with worsening hallucinations differential diagnosis: Exacerbation of her underlying psychiatric disorder,  intoxication, overdose unlikely, unlikely electrolyte abnormality or traumatic injury plan: Traumatic eval, CBC, CMP, toxicology screen, drug screen disposition: For psychiatry  Medical Complexity:  [x] New and requires workup. [] New and does not require workup. [x] Pertinent labs & imaging results were reviewed by me and considered in my decision making. [] I obtained history from someone other than the patient. [x] I reviewed previous medical records. [x] I independently visualized image(s), tracing(s), and/or specimen(s). [] I discussed the patient with another provider.     Results: EKG: My EKG read: n/a  LABS: Recent Results (from the past 24 hours)  Comprehensive Metabolic Panel (CMP)   Collection Time: 06/09/24  3:20 PM  Result Value Ref Range   Sodium 136 135 - 145 mmol/L   Potassium 3.6 3.5 - 5.0 mmol/L   Chloride 100 98 - 108 mmol/L   Carbon Dioxide (CO2) 27 21 - 30 mmol/L   Urea Nitrogen (BUN) 6 (L) 7 - 20 mg/dL   Creatinine 0.9 0.4 - 1.0 mg/dL   Glucose 652 (H) 70 - 140 mg/dL   Calcium  9.5 8.7 - 10.2 mg/dL   AST (Aspartate Aminotransferase) 17 15 - 41 U/L   ALT (Alanine Aminotransferase) 21 10 - 39 U/L   Bilirubin, Total 0.6 0.4 - 1.5 mg/dL   Alk Phos (Alkaline Phosphatase) 79 24 - 110 U/L   Albumin 3.4 (L) 3.5 - 4.8 g/dL   Protein, Total 7.4 6.2 - 8.1 g/dL   Anion Gap 9 3 - 12 mmol/L   BUN/CREA Ratio 6 6 - 27   Glomerular Filtration Rate (eGFR)  75 mL/min/1.73sq m  Complete Blood Count (CBC) with Differential   Collection Time: 06/09/24  3:20 PM  Result Value Ref Range   WBC (White Blood Cell Count) 10.5 (H) 3.2 - 9.8 x10^9/L   Hemoglobin 11.6 (L) 11.7 - 15.5 g/dL   Hematocrit 64.4 64.9 - 45.0 %   Platelets 220 150 - 450 x10^9/L   MCV (Mean Corpuscular Volume) 91 80 - 98 fL   MCH (Mean Corpuscular Hemoglobin) 29.7 26.5 - 34.0 pg   MCHC (Mean Corpuscular Hemoglobin Concentration) 32.7 31.0 - 36.0 %  RBC (Red Blood Cell Count) 3.90 3.77 - 5.16 x10^12/L   RDW-CV  (Red Cell Distribution Width) 12.8 11.5 - 14.5 %   NRBC (Nucleated Red Blood Cell Count) 0.00 0 x10^9/L   NRBC % (Nucleated Red Blood Cell %) 0.0 %   MPV (Mean Platelet Volume) 10.9 7.2 - 11.7 fL   Neutrophil Count 4.7 2.0 - 8.6 x10^9/L   Neutrophil % 44.6 37 - 80 %   Lymphocyte Count 5.2 (H) 0.6 - 4.2 x10^9/L   Lymphocyte % 49.2 10 - 50 %   Monocyte Count 0.4 0 - 0.9 x10^9/L   Monocyte % 4.1 0 - 12 %   Eosinophil Count 0.17 0 - 0.70 x10^9/L   Eosinophil % 1.6 0 - 7 %   Basophil Count 0.03 0 - 0.20 x10^9/L   Basophil % 0.3 0 - 2 %   Immature Granulocyte Count 0.02 <=0.06 x10^9/L   Immature Granulocyte % 0.2 <=0.7 %  Toxicology (Drug) Screen, Serum   Collection Time: 06/09/24  3:20 PM  Result Value Ref Range   Ethanol <5 <5 mg/dL   Acetaminophen  <10 (L) 10 - 25 mcg/mL   Salicylate <40 (L) 50 - 200 mg/L    IMAGING: Notified that Radiology will read radiologic studies and patient will be called if change in report by radiology department. CT brain without contrast  Final Result    CT cervical spine without contrast  Final Result    CT thoracic spine without contrast  Final Result    X-ray shoulder complete right minimum 2 views    (Results Pending)  X-ray hip right 2 or 3 views with or without pelvis    (Results Pending)  X-ray femur right 2 views    (Results Pending)  X-ray ribs right with PA chest 3 plus views    (Results Pending)   My imaging read: pending  Impression: 1. No acute intracranial findings. 2. Negative for fracture or static subluxation of the cervical spine.    Electronically Signed by:  Anil Nagavalli, DO, [facility] Radiology Electronically Signed on:  06/09/2024 5:10 PM  IMPRESSION: No acute abnormality the thoracic spine.   Electronically Signed by:  Anil Nagavalli, DO, [facility] Radiology Electronically Signed on:  06/09/2024 5:14 PM ED Course as of 06/09/24 2042  Fri Jun 09, 2024  1628 Patient presenting today after traumatic injury that occurred 2  days ago.  Given her home buprenorphine .  WBC 10.5 after traumatic injury without any infectious symptoms.  Hemoglobin 11.6.  Platelets are normal.  WBC is lower than prior.  CMP unremarkable with glucose of 347 with no anion gap elevation.  Toxicology screen is negative.  If traumatic evaluation is unremarkable, patient is medically clear for psychiatric disposition. Signed out to Dr. Sharlett pending x-ray review and CT results then psych c/s.  [LM]  1720 Ct brain, cspine, t spine negative.  [SS]  2011 Xray femur/hip/chest/shoulder neg my read  Awaiting psych recs.  Medically cleared.  [SS]  2041 Psychiatry has cleared for discharge.   Pt has been ambulatory and is medically cleared.    Imaging is unremarkable.  [SS]    ED Course User Index [LM] Merilee Jinnie Mighty, MD [SS] Sharlett Elspeth Brasil, MD     ED Clinical Impression  1. Visual hallucinations   2. Acute pain of right shoulder   3. Right leg pain   4. Rib pain on right side   5. Closed head injury, initial encounter  This note was partially written using Dragon (a voice recognition system) and may contain typographical errors missed during proofreading. TIP (no need to delete)  Click Refresh button to add wrap-up information to note.          Merilee Jinnie Mighty, MD 06/09/24 1630    Merilee Jinnie Mighty, MD 06/09/24 [phone]
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
## 2024-06-22 NOTE — Progress Notes (Signed)
 [facility] Telehealth Visit   This Telehealth visit was conducted with Alicia Mayo, a 58 y.o. female via video and mychart   Subjective :    CC: No chief complaint on file.    HPI  Her psychiatrist saw her and restarted her remeron  and increased the dose to get the hallucinations to stop (per the report of the patient). She was also started on risperdal. On this higher dose of remeron  (30 mg) and risperdal, the hallucinations have resolved.   Has appt with our clinical pharmacist on 8/26 for full med review. I am particularly interested in her med compliance with insulin  and her other diabetic meds.   She has not yet started the increased dose of her jardiance.   She is off the reglan and doing well from a GI standpoint.   Review of Systems ROS   Objective :   This virtual clinical encounter is limited by having no direct interview with the patient or assessment by physical exam.  Recommendations are based on previous encounters presenting with similar clinical data.   BP Readings from Last 3 Encounters:  06/13/24 120/85  06/09/24 (!) 156/83  06/06/24 128/60   Wt Readings from Last 5 Encounters:  06/13/24 94.9 kg (209 lb 3.2 oz)  06/06/24 95.3 kg (210 lb)  06/01/24 90.4 kg (199 lb 3.2 oz)  06/01/24 (!) 101.6 kg (224 lb)  03/31/24 94.5 kg (208 lb 6.4 oz)   There is no height or weight on file to calculate BMI.  Limited Physical Exam: Constitutional: alert, interactive with provider, cooperative, in no distress Mental status: oriented x 3, good historian, appropriate mood and behavior, thought content appears normal Respiratory: no respiratory distress, no audible wheezing   Assessment and Plan :   Assessment & Plan   Visit Diagnoses and Orders Diagnoses and all orders for this visit:  Hallucinations  Type 2 diabetes mellitus with hyperglycemia, with long-term current use of insulin  (CMS/HHS-HCC)   Hallucinations now resolved on rispedal and higher  dose of remeron . Continue. No reported adverse effects of being off her reglan, which may also be helping. Has not yet started jardiance 25 mg and has upcoming appt with our pharmacist. If her BG are still high and it appears that she is taking all of her meds appropriately (she will bring everything with her to that appt), then she will need increased dosing of her insulin .   F/u with me in 2 months, sooner if needed  No orders of the defined types were placed in this encounter.  Requested Prescriptions    No prescriptions requested or ordered in this encounter          Future Appointments     Date/Time Provider Department Center Visit Type   07/04/2024 2:30 PM (Arrive by 2:15 PM) Tamela, Isaiah Caldron, CPP [facility] MEDICINE CHECK   08/09/2024 2:00 PM (Arrive by 1:30 PM) Tamsen Maude Rayfield Learta, MD; [facility] CLINIC PROCEDURES [facility] PAIN PROCEDURE [place]   08/25/2024 2:30 PM (Arrive by 2:00 PM) Volanda Stair, NP [facility] RETURN VISIT   02/02/2025 9:15 AM Devetski, Debby RAMAN, OD [facility] EYE OPH COS RETURN        ELSIE EDWARDS BYNUM IV, MD  This video encounter was conducted with the patient's (or proxy's) verbal consent via secure, interactive audio and video telecommunications while away from clinic/office/hospital.  The patient (or proxy) was instructed to have this  encounter in a suitably private space and to only have persons present to whom they give permission to participate. In addition, patient identity was confirmed by use of name plus an additional identifier.  This visit was coded based on medical decision making (MDM).

## 2024-07-03 NOTE — Progress Notes (Signed)
 Ambulatory Referral to Primary Care [[id number]] (Order [id number]) Order Date: 06/12/2024 Department: [facility] Ordering/Authorizing: Dante Elsie Celestia CLORE, MD   Referral Notes Number of Notes: 1 . Type Date User Summary Attachment  Provider Comments 06/12/2024  8:58 AM Bynum, Elsie Celestia CLORE, MD Provider Comments -  Note: Reason for consult: Polypharmacy / medication management / deprescribing. Is the ordering provider an APP? No See my last note for details. Thanks!  PCP: Dante Elsie Celestia CLORE, MD   Patient ID   Alicia Mayo is a 58 y.o. female who presents for a pharmacist visit for evaluation of  Polypharmacy per referral from Bynum, Elsie Celestia CLORE, MD.    Subjective  Medications Pill Packs (packed at [facility]):   AM:  -Left at home by accident  Noon:  -buspirone 15 mg tablet  -pregabalin  150 mg tablet  PM: -paliperidone  ER 3 mg tablet -lurasidone  80 mg tablet - metoprolol tartrate 50 mg tablet -pregabalin  150 mg tablet -mirtazepine 30 mg tablet  -rosuvastatin  5 mg tablet  -trospium 20 mg tablet  -buspirone 15 mg tablet  -metformin ER 500 mg tablet (2 tablets)   Loose Bottles  -rispiridone 1 mg tablet (was given enough for 7 days and then told to stop after remaining 6 days).  -mirtazapine  bottle (empty) told no more extra tablets because it is in her pill pack -buspirone 15 mg tablet (told to discontinue now because is in pill pack)    -potassium chloride  20 mEq: take 1 tablet by mouth 2 times daily for 14 days -fexofenadine 180 mg tablet  -trospium 20 mg tablet  -buprenorphine  SL tablets  -promethazine PRN for nausea    -reports not taking cyclobenzaprine (LF 04/11/2024) or metoclopramide (LF 06/022025) anymore. Both are not with her today in her medications.   - has bottle of promethazine with her but was told not to take it anymore. STOP now written on her bottle.   -Reports no hallucinations recently.   Diabetes  Mellitus Type II -Was taken to the emergency room around 2 months ago with a blood sugar in the 20s. Bought glucose liquid as a result.   Unstable Lab Results  Component Value Date   HGBA1C 12.0 (H) 06/13/2024   HGBA1C 11.6 (H) 12/13/2023   Current meds:  -Jardiance 10 mg tablet daily -Novolog  Flexpen (insulin  aspart) 24 units -Lantus  Solostar (insulin  glargine) 52 units twice daily  -Metformin ER 500 mg tablet 4 tablets daily (2000 mg total) -Pregabalin  150 mg capsule 3 capsules daily (450 mg total)  Taking medications as directed:  Yes  Having side effects from medications: No, just trying to get adjusted to her sleep.    Current diet:   on average, 3 meals per day  24-Hour Food Recall:  Breakfast: [place], eggs.  Lunch: [employer] chicken legs and french fries.  Dinner: Child psychotherapist strips with dirty rice and cold slaw Snacks: potato chips yesterday Drinks: Mountain dew, water  Tobacco/EtOH Use: Smoke cigarettes (a pack and a half a day)    Current exercise: Nothing  CGM: Currently using Freestyle libre 2 sensors and reader.  Home blood sugar readings: BG > 400 yesterday.  CGM data downloaded in clinic.  Any hypoglycemic episodes: Yes- low of 27 about a month ago and felt queasy and off. Has had a couple around that time. History of lows.   Hypertension  Stable  Current meds:  -furosemide  20 mg tablet daily -losartan  100 mg tablet daily -metoprolol tartrate 50 mg tablet twice  daily  BP Readings from Last 3 Encounters:  07/04/24 110/65  06/13/24 120/85  06/09/24 (!) 156/83   Denies: chest pain on exertion, chest pain at rest, palpitations, edema, syncope, and orthopnea   Avoiding excessive salt intake: No   Review of Systems  Constitutional: negative for anorexia, chills, fatigue, fevers, malaise, night sweats, sweats, weight loss and hot flashes Eyes/Vision: negative for blurry vision or visual disturbance Respiratory: negative for cough, dyspnea on  exertion Cardiovascular: negative for chest pain, chest pressure/discomfort, dyspnea, fatiguelower extremity edema, and syncope Gastrointestinal: negative for abdominal pain, change in bowel habits, constipation, diarrhea, dyspepsia, dysphagia, nausea, and vomiting Genitourinary: negative for dysuria, frequency, hematuria, nocturia and urinary incontinence Hematologic/lymphatic: negative for bleeding, easy bruising Neurological: negative for dizziness, gait problems, headaches, seizures, tremors, vertigo and weakness Behavioral/Psych: negative for anxiety, depression Endocrine: negative for cold intolerance, heat intolerance and constipation  Patient Active Problem List  Diagnosis  . Asthma (HHS-HCC)  . Chronic low back pain  . Chronic abdominal pain, unspecified  . Insomnia  . Fibromyalgia  . Osteoarthritis  . Esophageal reflux  . Hot flashes  . PTSD (post-traumatic stress disorder), unspecified  . Bipolar II disorder (CMS-HCC)  . Chronic pain syndrome  . Urinary incontinence  . Polypharmacy  . Leg cramps  . Health care home, active care coordination  . Nausea and vomiting  . NASH (nonalcoholic steatohepatitis)  . Hypokalemia  . Essential hypertension  . Onychomycosis  . Lesion of vulva  . Cognitive deficit S/P CVA (cerebrovascular accident)  . Psychogenic nonepileptic seizure  . Tachypnea  . Moderate episode of recurrent major depressive disorder (CMS/HHS-HCC)  . Vulvodynia  . Trigger finger of left thumb  . Trigger index finger of left hand  . Gastroparesis  . Type 2 diabetes mellitus with hyperglycemia, with long-term current use of insulin  (CMS/HHS-HCC)  . Obesity (BMI 30.0-34.9)  . Anxiety  . Onychocryptosis  . Hallux valgus with bunions of left foot  . Hallux valgus with bunions of right foot  . COPD (chronic obstructive pulmonary disease) (CMS/HHS-HCC)  . Urinary retention  . Constipation  . Acute metabolic encephalopathy  . Opiate dependence (CMS/HHS-HCC)   . Dyslipidemia  . Anxiety and depression  . Other emphysema (CMS/HHS-HCC)  . Severe tobacco use disorder  . Type 2 diabetes mellitus with diabetic neuropathy (CMS/HHS-HCC)  . Cutaneous abscess of chest wall  . Sleep apnea    Past Medical History:  Diagnosis Date  . Allergen injection reaction   . Anxiety   . Arthritis    RA, DDD,OA  . Asthma without status asthmaticus (HHS-HCC)   . Bipolar 2 disorder (CMS/HHS-HCC)   . Bleeding disorder ()    told years ago bleeding disorder. tx not being followed now  . BMI 34.0-34.9,adult 06/17/2020  . Chest pain 06/17/2020  . COPD (chronic obstructive pulmonary disease) (CMS/HHS-HCC)   . Diabetes mellitus type 2, uncomplicated (CMS/HHS-HCC)   . Fibromyalgia   . GERD (gastroesophageal reflux disease)   . Hemorrhoid 2008   per sigmoidoscopy  . Hepatitis    NASH  . History of anesthesia reaction    took 3 days to wake up  following cholecystectomy  . Hyperlipidemia, acquired   . Hypertension   . Onychomycosis   . PONV (postoperative nausea and vomiting)    with multiple surgeries - on promethazine and ondansetron  daily  . Poor intravenous access    hard to find veins  . PTSD (post-traumatic stress disorder)   . Seizures (CMS/HHS-HCC)    last  2018  . Sleep apnea 11/29/2023  . Stroke (CMS/HHS-HCC) 2011/2013   affected right side and memory; has to use a walker  . Urinary incontinence   . Vision abnormalities     Past Surgical History:  Procedure Laterality Date  . CHOLECYSTECTOMY  1994  . ARTHROSCOPIC ROTATOR CUFF REPAIR Right 1997   Right  . COLONOSCOPY W/BIOPSY N/A 06/09/2018   Procedure: COLONOSCOPY, FLEXIBLE; WITH BIOPSY, SINGLE OR MULTIPLE;  Surgeon: Nola Tobias Brunt, MD;  Location: [facility];  Service: Gastroenterology;  Laterality: N/A;  . COLONOSCOPY W/REMOVAL LESIONS BY SNARE N/A 06/09/2018   Procedure: COLONOSCOPY, FLEXIBLE; WITH REMOVAL OF TUMOR(S), POLYP(S), OR OTHER LESION(S) BY SNARE TECHNIQUE;   Surgeon: Nola Tobias Brunt, MD;  Location: [facility];  Service: Gastroenterology;  Laterality: N/A;  . INCISION & DRAINAGE VULVA/PERINEAL ABSCESS N/A 08/02/2019   Procedure: INCISION AND DRAINAGE VULVA OR PERINEAL ABSCESS;  Surgeon: Zackery Toya Helling, MD;  Location: [facility];  Service: Gynecology;  Laterality: N/A;  . UNLISTED PROCEDURE NERVOUS SYSTEM N/A 12/07/2019   Procedure: Ganglion of Impar Block;  Surgeon: Etta Lot, MD;  [facility];  Service: Anes/ Pain Mgmt;  Laterality: N/A;  . INTRAOPERATIVE FLUOROSCOPY N/A 08/09/2020   Procedure: FLUOROSCOPIC GUIDANCE FOR NEEDLE PLACEMENT (EG, BIOPSY, ASPIRATION, INJECTION, LOCALIZATION DEVICE) (LIST SEPARATELY IN ADDITION TO CODE FOR PRIMARY PROCEDURE);  Surgeon: Etta Lot, MD;  [facility];  Service: Anes/ Pain Mgmt;  Laterality: N/A;  . PERCUTANEOUS TENOTOMY SINGLE EACH DIGIT Left 12/30/2020   Procedure: L- (x3) TENDON SHEATH INCISION (EG, FOR TRIGGER FINGER);  Surgeon: Klifto, Christopher Scott, MD;  Location: [facility];  Service: Orthopedics;  Laterality: Left;  . INTRAOPERATIVE FLUOROSCOPY N/A 02/20/2021   Procedure: FLUOROSCOPIC GUIDANCE FOR NEEDLE PLACEMENT (EG, BIOPSY, ASPIRATION, INJECTION, LOCALIZATION DEVICE) (LIST SEPARATELY IN ADDITION TO CODE FOR PRIMARY PROCEDURE);  Surgeon: Etta Lot, MD;  [facility];  Service: Anes/ Pain Mgmt;  Laterality: N/A;  . COLONOSCOPY N/A 12/10/2023   Procedure: COLORECTAL CANCER SCREENING; COLONOSCOPY ON INDIVIDUAL AT HIGH RISK;  Surgeon: Fernand Gregory List, MD;  Location: [facility];  Service: Gastroenterology;  Laterality: N/A;  . COLONOSCOPY W/REMOVAL LESIONS BY SNARE N/A 06/06/2024   Procedure: COLONOSCOPY, FLEXIBLE; WITH REMOVAL OF TUMOR(S), POLYP(S), OR OTHER LESION(S) BY SNARE TECHNIQUE;  Surgeon: Dinani, Amreen, MD;  Location: [facility];  Service: Gastroenterology;  Laterality: N/A;  . APPENDECTOMY    . HYSTERECTOMY    . OOPHORECTOMY     due to cancer cells    Family History  Problem Relation Name Age of Onset  . Thyroid disease Mother    . High blood pressure (Hypertension) Mother    . Glaucoma Mother    . Cervical cancer Mother  50  . Uterine cancer Mother    . Ovarian cancer Mother    . Bipolar disorder Mother    . Diabetes Mother    . High blood pressure (Hypertension) Father    . Bone cancer Father  4  . Thyroid disease Sister Haze   . High blood pressure (Hypertension) Sister Haze   . Glaucoma Sister Haze   . Heart disease Sister Haze   . Myocardial Infarction (Heart attack) Sister Lacina 10       pacemaker  . Diabetes Sister Haze   . High blood pressure (Hypertension) Sister Haddie   . Glaucoma Sister Haddie   . Stroke Sister Haddie   . Aneurysm  Sister Haddie   . Myocardial Infarction (Heart attack) Sister Haddie 47  . Diabetes Sister Haddie   . High blood pressure (Hypertension) Brother    . Glaucoma Brother    . High blood pressure (Hypertension) Brother    . Glaucoma Brother    . High blood pressure (Hypertension) Maternal Grandmother    . Glaucoma Maternal Grandmother    . Breast cancer Maternal Grandmother    . Cervical cancer Maternal Grandmother    . Uterine cancer Maternal Grandmother    . Ovarian cancer Maternal Grandmother    . High blood pressure (Hypertension) Maternal Grandfather    . High blood pressure (Hypertension) Paternal Grandmother    . High blood pressure (Hypertension) Paternal Grandfather    . Schizophrenia Child    . High blood pressure (Hypertension) Maternal Aunt    . Glaucoma Maternal Aunt    . Breast cancer Maternal Aunt    . Cervical cancer Maternal Aunt    . Uterine cancer Maternal Aunt    . Ovarian cancer Maternal Aunt    . Diabetes Maternal Aunt    . High blood pressure (Hypertension) Maternal Aunt    . Glaucoma Maternal Aunt     . Bipolar disorder Maternal Aunt    . Diabetes Maternal Aunt    . High blood pressure (Hypertension) Maternal Uncle    . High blood pressure (Hypertension) Paternal Aunt    . Glaucoma Paternal Aunt    . High blood pressure (Hypertension) Paternal Uncle    . Breast cancer Cousin    . Pancreatic cancer Cousin    . Liver disease Neg Hx    . Macular degeneration Neg Hx    . Melanoma Neg Hx    . Anesthesia problems Neg Hx      Outpatient Encounter Medications as of 07/04/2024  Medication Sig Dispense Refill  . ACCU-CHEK GUIDE GLUCOSE METER Misc 1 each as directed 1 each 1  . albuterol MDI, PROVENTIL, VENTOLIN, PROAIR, HFA 90 mcg/actuation inhaler Inhale 2 inhalations into the lungs every 4 (four) hours as needed for Wheezing 8.5 g 10  . alcohol swabs (ALCOHOL PREP PADS) PadM Apply 1 each topically 3 (three) times daily 270 each 3  . alcohol swabs PadM Apply 100 each topically 6 (six) times daily 200 each 11  . ammonium lactate (AMLACTIN) 12 % cream APPLY TOPICALLY TWICE A DAY 280 g 6  . blood glucose diagnostic test strip 1 each (1 strip total) 3 (three) times daily Use as instructed. 270 each 3  . blood glucose meter kit Use as directed 1 each 0  . blood glucose meter kit as directed 1 each 0  . buprenorphine  HCL (SUBUTEX ) 2 mg SL tablet Place 1 tablet (2 mg total) under the tongue 3 (three) times daily 90 tablet 2  . busPIRone (BUSPAR) 15 MG tablet Take 1 tablet (15 mg total) by mouth 3 (three) times daily 270 tablet 3  . calcipotriene (DOVONEX) 0.005 % cream Apply topically 2 (two) times daily 120 g 10  . cyclobenzaprine (FLEXERIL) 10 MG tablet TAKE 1 TABLET BY MOUTH TWICE DAILY AS NEEDED MUSCLE SPASMS 60 tablet 0  . desonide (DESOWEN) 0.05 % ointment Apply topically 3 (three) times daily 60 g 11  . diaper,brief,adult,disposable (BRIEFS, ADULT-EXTRA LARGE) Misc As needed 120 each 11  . diclofenac (VOLTAREN) 1 % topical gel Apply topically 4 (four) times daily (Patient taking differently:  Apply topically 4 (four) times daily as needed) 350 g 3  . empagliflozin (  JARDIANCE) 10 mg tablet Take 1 tablet (10 mg total) by mouth once daily 90 tablet 3  . escitalopram  oxalate (LEXAPRO ) 20 MG tablet Take 1 tablet (20 mg total) by mouth once daily 90 tablet 3  . estradioL (ESTRACE) 0.01 % (0.1 mg/gram) vaginal cream Place 0.5 g vaginally twice a week 42.5 g 0  . fexofenadine (ALLEGRA) 180 MG tablet Take 1 tablet (180 mg total) by mouth once daily 90 tablet 3  . flash glucose sensor (FREESTYLE LIBRE 2 SENSOR) kit USE TO TEST BLOOD SUGAR FOUR TIMES A DAY 2 kit 10  . flash glucose sensor (FREESTYLE LIBRE 2 SENSOR) Kit Use 1 kit every 14 (fourteen) days 6 each 3  . fluticasone propion-salmeteroL (ADVAIR DISKUS) 250-50 mcg/dose diskus inhaler INHALE ONE (1) PUFF BY MOUTH EVERY 12 HOURS 60 each 10  . fluticasone propionate (FLONASE) 50 mcg/actuation nasal spray SPRAY 2 SPRAYS INTO EACH NOSTRIL EVERY DAY 16 g 3  . food supplemt, lactose-reduced (ENSURE ACTIVE PROTEIN-MUSCLE) Liqd DRINK 1 BOTTLE BY MOUTH TWICE DAILY (Patient taking differently: Take 1 Bottle by mouth 4 (four) times daily 4 shakes per day) 4266 mL 10  . FREESTYLE LIBRE 2 READER reader Use 1 Device as directed 1 each 3  . FUROsemide  (LASIX ) 20 MG tablet Take 1 tablet (20 mg total) by mouth once daily 90 tablet 3  . glucose chew tablet 4 gram chewable tablet Take 4 tablets by mouth as needed for Low blood sugar    . incontinence pad, liner, disp Pads Liners for her bed due to incontinence. 30 each 3  . insulin  ASPART (NOVOLOG  FLEXPEN) pen injector (concentration 100 units/mL) Inject 24 Units subcutaneously 3 (three) times daily with meals Plus sliding scale as prescribed by endocrinology 15 mL 12  . insulin  GLARGINE (LANTUS  SOLOSTAR U-100 INSULIN ) pen injector (concentration 100 units/mL) Inject 52 Units subcutaneously 2 (two) times daily for 360 days (Patient taking differently: Inject 52 Units subcutaneously 36 Units BID) 31.2 mL 11  .  lancets Use 1 each 3 (three) times daily Use as instructed. 100 each 12  . lancets Use 1 each 3 (three) times daily Use as instructed. 270 each 3  . lidocaine  (LIDODERM ) 5 % patch Place 1 patch onto the skin once daily TO THE MOST PAINFUL AREA FOR UP TO 12 HOURS IN A 24 HOUR PERIOD 30 patch 10  . linaCLOtide (LINZESS) 290 mcg capsule Take 1 capsule (290 mcg total) by mouth once daily 90 capsule 3  . losartan  (COZAAR ) 100 MG tablet Take 1 tablet (100 mg total) by mouth once daily 90 tablet 3  . lurasidone  (LATUDA ) 60 mg tablet Take 1 tablet (60 mg total) by mouth once daily 90 tablet 3  . melatonin 5 mg TbDL Take 10 mg by mouth at bedtime 90 tablet 0  . metFORMIN (GLUCOPHAGE-XR) 500 MG XR tablet Take 2 tablets (1,000 mg total) by mouth 2 (two) times daily 360 tablet 3  . metoprolol TARTrate (LOPRESSOR) 50 MG tablet Take 1 tablet (50 mg total) by mouth 2 (two) times daily 180 tablet 3  . NARCAN 4 mg/actuation nasal spray USE 1 SPRAY IN 1 NOSTRIL ONCE AS NEEDED FOR ACCIDENTAL OVERDOSE MAY REPEAT IN 2-3 MINS IN OTHER NOSTRIL UNTIL PATIENT IS RESPONSIVE OR EMS ARRIVES 1 each 10  . ondansetron  (ZOFRAN ) 4 MG tablet Take 1 tablet (4 mg total) by mouth every 8 (eight) hours as needed 20 tablet 0  . pantoprazole  (PROTONIX ) 40 MG DR tablet Take 1 tablet (40  mg total) by mouth every morning 90 tablet 3  . pen needle, diabetic 32 gauge x 5/32 Ndle Use 1 each 4 (four) times daily 360 each 3  . polyethylene glycol (MIRALAX) packet Take 1 packet (17 g total) by mouth once daily Mix in 4-8ounces of fluid prior to taking. 90 packet 3  . pregabalin  (LYRICA ) 150 MG capsule Take 1 capsule (150 mg total) by mouth 3 (three) times daily 270 capsule 3  . promethazine (PHENERGAN) 25 MG tablet TAKE 1 TABLET BY MOUTH EVERY 6 HOURS AS NEEDED FOR NAUSEA 20 tablet 0  . rosuvastatin  (CRESTOR ) 5 MG tablet Take 1 tablet (5 mg total) by mouth once daily 90 tablet 3  . sodium chloride  (OCEAN) 0.65 % nasal spray Place 1 spray into  both nostrils as needed for Congestion 100 mL 1  . trospium (SANCTURA) 20 mg tablet Take 1 tablet (20 mg total) by mouth 2 (two) times daily 180 tablet 3  . underpads 23 X 36  Pads Use 1 Bag as directed 72 each 3   No facility-administered encounter medications on file as of 07/04/2024.    Social History   Socioeconomic History  . Marital status: Divorced  . Number of children: 6  . Years of education: 35  . Highest education level: Associate degree: academic program  Occupational History  . Occupation: disabilty  Tobacco Use  . Smoking status: Every Day    Current packs/day: 1.50    Average packs/day: 1.5 packs/day for 38.0 years (57.0 ttl pk-yrs)    Types: Cigarettes    Passive exposure: Never  . Smokeless tobacco: Never  . Tobacco comments:    Currently smoking 1.5 packs per day - declines smoking cessation consult - has tried multiple medications that she cannot tolerate   Vaping Use  . Vaping status: Former  Substance and Sexual Activity  . Alcohol use: Not Currently  . Drug use: No  . Sexual activity: Defer  Social History Narrative   Single. Divorced. 8 children- 6 living-4 daughters, 2 sons.  She did not raise all of them.  States the sons grew up in [place].  One of her daughters, Dwyane Lesches, has been causing a lot of stress lately going to her house in the middle of the night, wanting her to keep the two young grandchildren.  Same daughter broke into her house  Recently (11/2018) looking for medications.     Used to work in Audiological scientist, nursing, Freight forwarder and house cleaning.   No blood transfusions. One tattoo. Ear piercings and tongue piercings.   Social Drivers of Corporate investment banker Strain: Low Risk  (08/31/2023)   Overall Financial Resource Strain (CARDIA)   . Difficulty of Paying Living Expenses: Not very hard  Food Insecurity: Food Insecurity Present (08/31/2023)   Hunger Vital Sign   . Worried About Programme researcher, broadcasting/film/video in the Last Year: Sometimes  true   . Ran Out of Food in the Last Year: Sometimes true  Transportation Needs: No Transportation Needs (08/31/2023)   PRAPARE - Transportation   . Lack of Transportation (Medical): No   . Lack of Transportation (Non-Medical): No  Physical Activity: Inactive (11/03/2022)   Exercise Vital Sign   . Days of Exercise per Week: 0 days   . Minutes of Exercise per Session: 0 min  Stress: No Stress Concern Present (11/03/2022)   [employer] of Occupational Health - Occupational Stress Questionnaire   . Feeling of Stress : Only a little  Social Connections: Socially Isolated (11/03/2022)   Social Connection and Isolation Panel   . Frequency of Communication with Friends and Family: More than three times a week   . Frequency of Social Gatherings with Friends and Family: Twice a week   . Attends Religious Services: Never   . Active Member of Clubs or Organizations: No   . Attends Banker Meetings: Never   . Marital Status: Divorced  Housing Stability: Low Risk  (03/31/2024)   Housing Stability Vital Sign   . Unable to Pay for Housing in the Last Year: No   . Number of Times Moved in the Last Year: 0   . Homeless in the Last Year: No    PFMSH Medications and Allergies reviewed and updated  Objective             Most recent labs: HgbA1c:  Lab Results  Component Value Date   HGBA1C 12.0 (H) 06/13/2024   HGBA1C 11.6 (H) 12/13/2023   HGBA1C 11.4 (H) 08/20/2023     Lipid:  Lab Results  Component Value Date   CHOLTOTAL 158 12/03/2020    Lab Results  Component Value Date   HDL 40 12/03/2020   Last Lipids: Lab Results  Component Value Date   CHOLTOTAL 158 12/03/2020   LDLCALC 83 12/03/2020   HDL 40 12/03/2020   TRIG 176 12/03/2020   LDL 100 09/06/2018     Lab Results  Component Value Date   TRIG 176 12/03/2020      Chemistry      Component Value Date/Time   NA 136 06/09/2024 1520   NA 136 07/27/2013 1653   K 3.6 06/09/2024 1520   K 4.2 07/27/2013  1653   CL 100 06/09/2024 1520   CL 100 07/27/2013 1653   CO2 27 06/09/2024 1520   BUN 6 (L) 06/09/2024 1520   CREATININE 0.9 06/09/2024 1520   CREATININE 0.8 07/27/2013 1653   GLUCOSE 347 (H) 06/09/2024 1520   GLUCOSE 362 (H) 07/27/2013 1653      Component Value Date/Time   CALCIUM  9.5 06/09/2024 1520   CALCIUM  9.3 07/27/2013 1653   ALKPHOS 79 06/09/2024 1520   ALKPHOS 89 08/02/2013 1640   AST 17 06/09/2024 1520   AST 22 08/02/2013 1640   ALT 21 06/09/2024 1520   ALT 27 08/02/2013 1640   TBILI 0.6 06/09/2024 1520   TBILI 0.4 08/02/2013 1640      Lab Results  Component Value Date   GFR 75 06/09/2024    Last DPC urine microalbumin/creatinine ratio (normal is <30 mg/g) 05/24/2023 UACr at goal of <30 at 24.5.  The ASCVD Risk score (Arnett DK, et al., 2019) failed to calculate for the following reasons:   Risk score cannot be calculated because patient has a medical history suggesting prior/existing ASCVD  Health Maintenance  Dilated eye exam date: 01/26/2023 Monofilament exam date: Completed within the last year Urine Micro/Cr Ratio: 05/24/2023 (24.5) Flu shot: Up to date Pneumococcal 23: Completed Hepatitis B vaccine: Unknown COVID-19 vaccine: Yes  Statin: Yes Aspirin: No - allergy ACEI or ARB? Yes  Glucagon emergency kit: Yes Tobacco:  Social History   Tobacco Use  Smoking Status Every Day  . Current packs/day: 1.50  . Average packs/day: 1.5 packs/day for 38.0 years (57.0 ttl pk-yrs)  . Types: Cigarettes  . Passive exposure: Never  Smokeless Tobacco Never  Tobacco Comments   Currently smoking 1.5 packs per day - declines smoking cessation consult - has tried multiple medications that  she cannot tolerate     Assessment and Plan  Polypharmacy:   Assessment: -Not taking cyclobenzaprine or metoclopramide as directed. -DOES have a bottle of promethazine with her with one tablet left. -No hallucinations recently  Plan:  -STOP written on bottle of  promethazine and instructed to no longer take.  -Sent refill in for ondansetron  to take instead -Instructed to continue to avoid taking cyclobenzaprine and metoclopramide  Diabetes Mellitus:  Assessment:  poor control. A1C 12.0%, goal <7% Lab Results  Component Value Date   HGBA1C 12.0 (H) 06/13/2024   HGBA1C 11.6 (H) 12/13/2023    Plan: -Switch to Dexcom G7 sensors when shipped in. Dexcom G7 app set up on phone.  -Continue Lantus  (insulin  glargine) 52 units 2 times daily. Instructed not to take if BS is < 100 mg/dL.  -Continue Novolog  with meals. Instructed it is okay to skip if blood sugar is under 150 mg/dL. New scale given below:  Blood sugar 150-200: Novolog  18 units Blood sugar 201-250: Novolog  20 units Blood sugar 251-300: Novolog  22 units Blood sugar 301-350: Novolog  24 units Blood sugar 351-400: Novolog  26 units Blood sugar over 400: Novolog  28 units -  Encouraged regular aerobic exercise and weight loss -  BP goal of <130/80 -  LDL goal of <100  Statin Use  Assessment:    The patient has the following risk factors: Diabetes Mellitus     Last LDL is 83 (3 years ago) and is at goal of <100.    Patient is currently:  Taking a statin -rosuvastatin  5mg  daily  Plan: Continue current therapy  ACC/AHA guidelines advise in patients age 34-75 with diabetes mellitus at least moderate intensity statin is indicated regardless of 10-year ASCVD risk estimate. It is reasonable to consider higher intensity statin to reduce LDL by at least 50% in patients with multiple ASCVD risk factors.  Hypertension Assessment: Blood pressure under good control. BP: 110/65  . Goal <130/80. Denies symptoms of hypotension.  Plan: -Continue current regimen -  Encouraged dietary sodium restriction/DASH diet -  Recommended regular aerobic exercise. -  Recommend home blood pressure monitoring, to bring results in next visit -  Goal of BP <130/90  Education regarding condition and any medications  prescribed provided in AVS given to patient at conclusion of today's office visit. Patient verbalized understanding and all questions were answered. Patient is agreeable with the plan outlined above. No barriers to learning were identified.  Continue yearly eye exams, Influenza Vaccine, Pneumonia Vaccine, Urine albumin/creatinine ratio, Lipid/Chem Panel.  Statin, Aspirin, and ACEI/ARB where appropriate.  Advise patient to never use tobacco products; Exercise 1 hour daily; Maintain normal BMI; Adhere to ADA diet; maintain regular foot inspections and glucose monitoring as directed.  Periodic Diabetes Education Classes  Time spent directly with patient: 1.5 hours  Follow-up  Follow up 3 months or sooner with CGM  Attestation Statement:   Patient interviewed by Alphia Pina, Pharm.D candidate and Isaiah Nasuti, PharmD,  I personally saw and evaluated the patient, and participated in the management and treatment plan as documented in the note.  Alphia Pina

## 2024-07-04 NOTE — Progress Notes (Signed)
 SABRA

## 2024-07-06 NOTE — Unmapped External Note (Signed)
 Study Title: PATHFINDER 2 Study Sponsor: GRAIL Sponsor-assigned protocol number: [id number] Cancer Status Assessment Year 2 was completed over the phone with the participant on 07/06/2024. All relevant data from the medical record and participant reported information have been entered in the [facility].

## 2024-07-31 NOTE — Progress Notes (Signed)
 CLINICAL SOCIAL WORK NOTE  Alicia Mayo [id number] 06/22/1966  CLINICAL SOCIAL WORK INTERVENTION PROVIDED:  LCSW received a VM from pt. Pt was driving and it was often difficult to hear what pt was saying, although it seemed like pt was saying that she would be late for her appointment today. LCSW called pt back to see if there were any social work needs and left a VM. PLAN: Clinical Social Work services and interventions will continue to be available to patient and/or family while under the care of the [facility] Medical Team.  Patient is aware of LCSW services, and LCSW is available as needed.   Clinical Intervention Provided:  Care Coordination and Advocacy, Other   Harriette Alpers, MA, MSW, LCSW [facility] Social Worker Pager: [phone] Phone: [phone] Fax: [phone] Pronouns: She/Her/Hers

## 2024-08-02 ENCOUNTER — Encounter: Payer: Self-pay | Admitting: Emergency Medicine

## 2024-08-02 ENCOUNTER — Emergency Department

## 2024-08-02 ENCOUNTER — Inpatient Hospital Stay
Admission: EM | Admit: 2024-08-02 | Discharge: 2024-08-07 | DRG: 689 | Disposition: A | Discharge status: Home Health | Attending: Internal Medicine | Admitting: Internal Medicine

## 2024-08-02 ENCOUNTER — Other Ambulatory Visit: Payer: Self-pay

## 2024-08-02 DIAGNOSIS — J4489 Other specified chronic obstructive pulmonary disease: Secondary | ICD-10-CM | POA: Diagnosis present

## 2024-08-02 DIAGNOSIS — Z885 Allergy status to narcotic agent status: Secondary | ICD-10-CM

## 2024-08-02 DIAGNOSIS — E1165 Type 2 diabetes mellitus with hyperglycemia: Secondary | ICD-10-CM | POA: Diagnosis present

## 2024-08-02 DIAGNOSIS — F17210 Nicotine dependence, cigarettes, uncomplicated: Secondary | ICD-10-CM | POA: Diagnosis present

## 2024-08-02 DIAGNOSIS — R531 Weakness: Secondary | ICD-10-CM | POA: Diagnosis not present

## 2024-08-02 DIAGNOSIS — Z91041 Radiographic dye allergy status: Secondary | ICD-10-CM

## 2024-08-02 DIAGNOSIS — R197 Diarrhea, unspecified: Secondary | ICD-10-CM

## 2024-08-02 DIAGNOSIS — F3181 Bipolar II disorder: Secondary | ICD-10-CM | POA: Diagnosis present

## 2024-08-02 DIAGNOSIS — E785 Hyperlipidemia, unspecified: Secondary | ICD-10-CM | POA: Diagnosis present

## 2024-08-02 DIAGNOSIS — N179 Acute kidney failure, unspecified: Secondary | ICD-10-CM

## 2024-08-02 DIAGNOSIS — E1143 Type 2 diabetes mellitus with diabetic autonomic (poly)neuropathy: Secondary | ICD-10-CM | POA: Diagnosis present

## 2024-08-02 DIAGNOSIS — G9341 Metabolic encephalopathy: Secondary | ICD-10-CM | POA: Diagnosis present

## 2024-08-02 DIAGNOSIS — N390 Urinary tract infection, site not specified: Secondary | ICD-10-CM | POA: Diagnosis not present

## 2024-08-02 DIAGNOSIS — I10 Essential (primary) hypertension: Secondary | ICD-10-CM | POA: Diagnosis present

## 2024-08-02 DIAGNOSIS — Z794 Long term (current) use of insulin: Secondary | ICD-10-CM

## 2024-08-02 DIAGNOSIS — I959 Hypotension, unspecified: Secondary | ICD-10-CM

## 2024-08-02 DIAGNOSIS — G8929 Other chronic pain: Secondary | ICD-10-CM | POA: Insufficient documentation

## 2024-08-02 DIAGNOSIS — K3184 Gastroparesis: Secondary | ICD-10-CM | POA: Diagnosis present

## 2024-08-02 DIAGNOSIS — E878 Other disorders of electrolyte and fluid balance, not elsewhere classified: Secondary | ICD-10-CM

## 2024-08-02 DIAGNOSIS — Z888 Allergy status to other drugs, medicaments and biological substances status: Secondary | ICD-10-CM

## 2024-08-02 DIAGNOSIS — F29 Unspecified psychosis not due to a substance or known physiological condition: Secondary | ICD-10-CM | POA: Diagnosis present

## 2024-08-02 DIAGNOSIS — R5383 Other fatigue: Secondary | ICD-10-CM | POA: Diagnosis present

## 2024-08-02 DIAGNOSIS — F419 Anxiety disorder, unspecified: Secondary | ICD-10-CM | POA: Diagnosis present

## 2024-08-02 DIAGNOSIS — Z91013 Allergy to seafood: Secondary | ICD-10-CM

## 2024-08-02 DIAGNOSIS — Z79891 Long term (current) use of opiate analgesic: Secondary | ICD-10-CM

## 2024-08-02 DIAGNOSIS — Z8673 Personal history of transient ischemic attack (TIA), and cerebral infarction without residual deficits: Secondary | ICD-10-CM

## 2024-08-02 DIAGNOSIS — M797 Fibromyalgia: Secondary | ICD-10-CM | POA: Diagnosis present

## 2024-08-02 DIAGNOSIS — R4182 Altered mental status, unspecified: Secondary | ICD-10-CM

## 2024-08-02 DIAGNOSIS — Z91048 Other nonmedicinal substance allergy status: Secondary | ICD-10-CM

## 2024-08-02 DIAGNOSIS — E876 Hypokalemia: Secondary | ICD-10-CM | POA: Diagnosis present

## 2024-08-02 DIAGNOSIS — Z79899 Other long term (current) drug therapy: Secondary | ICD-10-CM

## 2024-08-02 LAB — URINALYSIS, ROUTINE W REFLEX MICROSCOPIC
Bilirubin Urine: NEGATIVE
Glucose, UA: 50 mg/dL — AB
Hgb urine dipstick: NEGATIVE
Ketones, ur: NEGATIVE mg/dL
Nitrite: POSITIVE — AB
Protein, ur: 30 mg/dL — AB
Specific Gravity, Urine: 1.013 (ref 1.005–1.030)
pH: 6 (ref 5.0–8.0)

## 2024-08-02 LAB — CBC
HCT: 33.2 % — ABNORMAL LOW (ref 36.0–46.0)
Hemoglobin: 10.8 g/dL — ABNORMAL LOW (ref 12.0–15.0)
MCH: 29.8 pg (ref 26.0–34.0)
MCHC: 32.5 g/dL (ref 30.0–36.0)
MCV: 91.7 fL (ref 80.0–100.0)
Platelets: 240 K/uL (ref 150–400)
RBC: 3.62 MIL/uL — ABNORMAL LOW (ref 3.87–5.11)
RDW: 13.4 % (ref 11.5–15.5)
WBC: 8.1 K/uL (ref 4.0–10.5)
nRBC: 0 % (ref 0.0–0.2)

## 2024-08-02 LAB — COMPREHENSIVE METABOLIC PANEL WITH GFR
ALT: 15 U/L (ref 0–44)
AST: 18 U/L (ref 15–41)
Albumin: 3.9 g/dL (ref 3.5–5.0)
Alkaline Phosphatase: 63 U/L (ref 38–126)
Anion gap: 11 (ref 5–15)
BUN: 9 mg/dL (ref 6–20)
CO2: 29 mmol/L (ref 22–32)
Calcium: 9.5 mg/dL (ref 8.9–10.3)
Chloride: 98 mmol/L (ref 98–111)
Creatinine, Ser: 1.15 mg/dL — ABNORMAL HIGH (ref 0.44–1.00)
GFR, Estimated: 56 mL/min — ABNORMAL LOW (ref >60)
Glucose, Bld: 214 mg/dL — ABNORMAL HIGH (ref 70–99)
Potassium: 3 mmol/L — ABNORMAL LOW (ref 3.5–5.1)
Sodium: 138 mmol/L (ref 135–145)
Total Bilirubin: 0.7 mg/dL (ref 0.0–1.2)
Total Protein: 7.9 g/dL (ref 6.5–8.1)

## 2024-08-02 LAB — MAGNESIUM: Magnesium: 1.6 mg/dL — ABNORMAL LOW (ref 1.7–2.4)

## 2024-08-02 MED ORDER — SODIUM CHLORIDE 0.9 % IV BOLUS
1000.0000 mL | Freq: Once | INTRAVENOUS | Status: AC
Start: 2024-08-02 — End: 2024-08-03
  Administered 2024-08-03: 1000 mL via INTRAVENOUS

## 2024-08-02 MED ORDER — CEPHALEXIN 500 MG PO CAPS
500.0000 mg | ORAL_CAPSULE | Freq: Two times a day (BID) | ORAL | Status: DC
Start: 2024-08-03 — End: 2024-08-08

## 2024-08-02 MED ORDER — POTASSIUM CHLORIDE 20 MEQ PO PACK
40.0000 meq | PACK | Freq: Once | ORAL | Status: AC
  Administered 2024-08-02: 40 meq via ORAL
  Filled 2024-08-02: qty 2

## 2024-08-02 MED ORDER — CEFTRIAXONE SODIUM 1 G IJ SOLR
1.0000 g | Freq: Once | INTRAMUSCULAR | Status: AC
  Administered 2024-08-02: 1 g via INTRAMUSCULAR
  Filled 2024-08-02: qty 10

## 2024-08-02 MED ORDER — MAGNESIUM SULFATE IN D5W 1-5 GM/100ML-% IV SOLN
1.0000 g | Freq: Once | INTRAVENOUS | Status: AC
Start: 2024-08-02 — End: 2024-08-03
  Administered 2024-08-03: 1 g via INTRAVENOUS
  Filled 2024-08-02: qty 100

## 2024-08-02 NOTE — BH Assessment (Signed)
 This Clinical research associate and Psyc NP Jon attempted to assess the patient but had some difficulty based on her presentation. Patient's presentation appeared drowsy, confused, with slurred speech. UA appears to have UTI based on nurse's interpretation, communicated this to EDP Mumma.  Psyc team to follow

## 2024-08-02 NOTE — Progress Notes (Signed)
 Bilateral upper extremities accessed with ultrasound. No viable veins present. Primary nurse aware.

## 2024-08-02 NOTE — ED Triage Notes (Signed)
 Arrives from home via [facility] c/O generlized weakness x 2 days. CBG: 250. VS wnl.

## 2024-08-02 NOTE — ED Notes (Signed)
Notified lab for blood draw

## 2024-08-02 NOTE — ED Provider Notes (Signed)
 [facility] Provider Note    Event Date/Time   First MD Initiated Contact with Patient 08/02/24 1758     (approximate)   History   Weakness   HPI  Alicia Mayo is a 58 y.o. female past medical history significant for fibromyalgia, bipolar disorder, hypertension, diabetes, who presents to the emergency department with weakness and altered mental status.  Patient very difficult to understand.  Slurring her words and is very sleeping on exam.  No one else is with her at bedside.  Patient states she has been having diarrhea and feeling weak.  Unable to give any further history  Patient's caregiver Alicia Mayo arrived to the emergency department.  Stated that she has been having worsening hallucinations and she has been confused to where she is.  States that she thought she was driving her daughter's car.  States that she has a history of hallucinations and just was seen by psychiatry.  States that she was given a new injection on Monday for her hallucinations and was told by the psychiatrist to come to the emergency department and be seen again if her hallucinations worsened.  Felt that it has worsened over the past couple of days.  As of a couple weeks ago was able to take care of herself.     Physical Exam   Triage Vital Signs: ED Triage Vitals [08/02/24 1543]  Encounter Vitals Group     BP 95/64     Girls Systolic BP Percentile      Girls Diastolic BP Percentile      Boys Systolic BP Percentile      Boys Diastolic BP Percentile      Pulse Rate 87     Resp 17     Temp 99.4 F (37.4 C)     Temp Source Oral     SpO2 96 %     Weight 210 lb (95.3 kg)     Height 5' 7 (1.702 m)     Head Circumference      Peak Flow      Pain Score 0     Pain Loc      Pain Education      Exclude from Growth Chart     Most recent vital signs: Vitals:   08/02/24 2230 08/02/24 2300  BP: (!) 166/97 (!) 169/99  Pulse: 71 70  Resp:  12  Temp:    SpO2: 98% 100%    Physical  Exam Constitutional:      Appearance: She is well-developed. She is obese.     Comments: Somnolent but arousable.  Moving all extremities and answering some questions appropriately  HENT:     Head: Atraumatic.  Eyes:     Extraocular Movements: Extraocular movements intact.     Conjunctiva/sclera: Conjunctivae normal.     Pupils: Pupils are equal, round, and reactive to light.  Cardiovascular:     Rate and Rhythm: Regular rhythm.  Pulmonary:     Effort: No respiratory distress.     Breath sounds: No wheezing.  Abdominal:     General: There is no distension.     Tenderness: There is no abdominal tenderness.  Musculoskeletal:        General: Normal range of motion.     Cervical back: Normal range of motion.     Right lower leg: No edema.     Left lower leg: No edema.  Skin:    General: Skin is warm.     Capillary Refill:  Capillary refill takes less than 2 seconds.  Neurological:     General: No focal deficit present.     Mental Status: She is alert. Mental status is at baseline. She is disoriented.  Psychiatric:        Mood and Affect: Mood normal.     IMPRESSION / MDM / ASSESSMENT AND PLAN / ED COURSE  I reviewed the triage vital signs and the nursing notes.  Differential diagnosis including dehydration, electrolyte abnormality, intracranial hemorrhage, medication side effect, hallucinations from acute psychosis from bipolar disorder, CVA  EKG  I, Clotilda Punter, the attending physician, personally viewed and interpreted this ECG.   Rate: Normal  Rhythm: Normal sinus  Axis: Normal  Intervals: Normal  ST&T Change: None  No tachycardic or bradycardic dysrhythmias while on cardiac telemetry.  RADIOLOGY I independently reviewed imaging, my interpretation of imaging: CT scan of the head with no signs of intracranial hemorrhage  CT scan abdomen pelvis without contrast showed no acute findings  LABS (all labs ordered are listed, but only abnormal results are  displayed) Labs interpreted as -    Labs Reviewed  COMPREHENSIVE METABOLIC PANEL WITH GFR - Abnormal; Notable for the following components:      Result Value   Potassium 3.0 (*)    Glucose, Bld 214 (*)    Creatinine, Ser 1.15 (*)    GFR, Estimated 56 (*)    All other components within normal limits  CBC - Abnormal; Notable for the following components:   RBC 3.62 (*)    Hemoglobin 10.8 (*)    HCT 33.2 (*)    All other components within normal limits  URINALYSIS, ROUTINE W REFLEX MICROSCOPIC - Abnormal; Notable for the following components:   Color, Urine YELLOW (*)    APPearance HAZY (*)    Glucose, UA 50 (*)    Protein, ur 30 (*)    Nitrite POSITIVE (*)    Leukocytes,Ua SMALL (*)    Bacteria, UA FEW (*)    All other components within normal limits  MAGNESIUM  - Abnormal; Notable for the following components:   Magnesium  1.6 (*)    All other components within normal limits  URINE CULTURE  CULTURE, BLOOD (ROUTINE X 2)  CULTURE, BLOOD (ROUTINE X 2)  LACTIC ACID, PLASMA  LACTIC ACID, PLASMA  CBG MONITORING, ED     MDM  Patient here with diarrhea and altered mental status.  No significant leukocytosis.  Anemia but hemoglobin appears to be stable.  Creatinine is at her baseline with mild hypokalemia and hypomagnesia -given oral replacement  Lactic acid normal  CT scan of the head with no signs of intracranial hemorrhage  CT scan abdomen and pelvis without contrast obtained given her contrast allergy with no acute findings  Consulted psychiatry for evaluation for her hallucinations  Psychiatry evaluated the patient.  Patient's urine did come back concerning for urinary tract infection with positive nitrites and leukocyte esterase with WBC.  Patient does have some significant confusion and I am uncertain of what her baseline is but this does appear to be a change.  Multiple IV sticks with delay of antibiotics and delay of getting blood cultures or lactic acid.  Ultimately  unable to get an IV.  Given IM Rocephin .  Consulted hospitalist for admission for altered mental status and a urinary tract infection      PROCEDURES:  Critical Care performed: No  .Ultrasound ED Peripheral IV (Provider)  Date/Time: 08/03/2024 1:01 AM  Performed by: Punter Clotilda, MD Authorized  by: Suzanne Kirsch, MD   Procedure details:    Indications: multiple failed IV attempts     Skin Prep: chlorhexidine gluconate     Location:  Right AC   Angiocath:  18 G   Bedside Ultrasound Guided: Yes     Images: not archived     Patient tolerated procedure without complications: Yes     Dressing applied: Yes   Comments:     Not successful   Patient's presentation is most consistent with acute presentation with potential threat to life or bodily function.   MEDICATIONS ORDERED IN ED: Medications  sodium chloride  0.9 % bolus 1,000 mL (0 mLs Intravenous Hold 08/02/24 2253)  magnesium  sulfate IVPB 1 g 100 mL (0 g Intravenous Hold 08/02/24 2254)  cephALEXin  (KEFLEX ) capsule 500 mg (has no administration in time range)  potassium chloride  (KLOR-CON ) packet 40 mEq (40 mEq Oral Given 08/02/24 1959)  cefTRIAXone  (ROCEPHIN ) injection 1 g (1 g Intramuscular Given 08/02/24 2257)    FINAL CLINICAL IMPRESSION(S) / ED DIAGNOSES   Final diagnoses:  None     Rx / DC Orders   ED Discharge Orders     None        Note:  This document was prepared using Dragon voice recognition software and may include unintentional dictation errors.   Suzanne Kirsch, MD 08/03/24 [phone]

## 2024-08-02 NOTE — ED Triage Notes (Signed)
 Patient to ED via [facility] from home for generalized weakness. Patient only alert to self at this time. PT noted to have trouble staying awake and answer questions in triage. Pt leaning to the right in wheelchair. Speech difficult to understand.   Caregiver arrived to triage stating that patient takes a monthly shot that they upped on Monday and since she has been weak and drowsy. Caregiver also reports hallucinations.

## 2024-08-03 DIAGNOSIS — E1165 Type 2 diabetes mellitus with hyperglycemia: Secondary | ICD-10-CM

## 2024-08-03 DIAGNOSIS — E878 Other disorders of electrolyte and fluid balance, not elsewhere classified: Secondary | ICD-10-CM

## 2024-08-03 DIAGNOSIS — N390 Urinary tract infection, site not specified: Principal | ICD-10-CM

## 2024-08-03 DIAGNOSIS — F319 Bipolar disorder, unspecified: Secondary | ICD-10-CM | POA: Diagnosis not present

## 2024-08-03 DIAGNOSIS — F05 Delirium due to known physiological condition: Secondary | ICD-10-CM

## 2024-08-03 DIAGNOSIS — R4182 Altered mental status, unspecified: Secondary | ICD-10-CM

## 2024-08-03 DIAGNOSIS — G8929 Other chronic pain: Secondary | ICD-10-CM | POA: Insufficient documentation

## 2024-08-03 DIAGNOSIS — I959 Hypotension, unspecified: Secondary | ICD-10-CM

## 2024-08-03 DIAGNOSIS — N179 Acute kidney failure, unspecified: Secondary | ICD-10-CM

## 2024-08-03 DIAGNOSIS — G9341 Metabolic encephalopathy: Secondary | ICD-10-CM | POA: Diagnosis present

## 2024-08-03 DIAGNOSIS — R5383 Other fatigue: Secondary | ICD-10-CM | POA: Diagnosis present

## 2024-08-03 LAB — BLOOD CULTURE ID PANEL (REFLEXED) - BCID2

## 2024-08-03 LAB — HEMOGLOBIN A1C
Hgb A1c MFr Bld: 10.3 % — ABNORMAL HIGH (ref 4.8–5.6)
Mean Plasma Glucose: 248.91 mg/dL

## 2024-08-03 LAB — BASIC METABOLIC PANEL WITH GFR
Anion gap: 16 — ABNORMAL HIGH (ref 5–15)
BUN: 8 mg/dL (ref 6–20)
CO2: 26 mmol/L (ref 22–32)
Calcium: 8.9 mg/dL (ref 8.9–10.3)
Chloride: 102 mmol/L (ref 98–111)
Creatinine, Ser: 0.71 mg/dL (ref 0.44–1.00)
GFR, Estimated: >60 mL/min (ref >60)
Glucose, Bld: 152 mg/dL — ABNORMAL HIGH (ref 70–99)
Potassium: 2.9 mmol/L — ABNORMAL LOW (ref 3.5–5.1)
Sodium: 144 mmol/L (ref 135–145)

## 2024-08-03 LAB — CBC
HCT: 31.6 % — ABNORMAL LOW (ref 36.0–46.0)
Hemoglobin: 10.4 g/dL — ABNORMAL LOW (ref 12.0–15.0)
MCH: 29.9 pg (ref 26.0–34.0)
MCHC: 32.9 g/dL (ref 30.0–36.0)
MCV: 90.8 fL (ref 80.0–100.0)
Platelets: 207 K/uL (ref 150–400)
RBC: 3.48 MIL/uL — ABNORMAL LOW (ref 3.87–5.11)
RDW: 13.2 % (ref 11.5–15.5)
WBC: 7.6 K/uL (ref 4.0–10.5)
nRBC: 0 % (ref 0.0–0.2)

## 2024-08-03 LAB — GLUCOSE, CAPILLARY
Glucose-Capillary: 149 mg/dL — ABNORMAL HIGH (ref 70–99)
Glucose-Capillary: 204 mg/dL — ABNORMAL HIGH (ref 70–99)
Glucose-Capillary: 186 mg/dL — ABNORMAL HIGH (ref 70–99)
Glucose-Capillary: 227 mg/dL — ABNORMAL HIGH (ref 70–99)

## 2024-08-03 LAB — LACTIC ACID, PLASMA
Lactic Acid, Venous: 0.8 mmol/L (ref 0.5–1.9)
Lactic Acid, Venous: 1.3 mmol/L (ref 0.5–1.9)

## 2024-08-03 LAB — HIV ANTIBODY (ROUTINE TESTING W REFLEX): HIV Screen 4th Generation wRfx: NONREACTIVE

## 2024-08-03 LAB — CBG MONITORING, ED: Glucose-Capillary: 151 mg/dL — ABNORMAL HIGH (ref 70–99)

## 2024-08-03 LAB — MAGNESIUM: Magnesium: 1.7 mg/dL (ref 1.7–2.4)

## 2024-08-03 MED ORDER — SODIUM CHLORIDE 0.9 % IV SOLN
2.0000 g | INTRAVENOUS | Status: DC
  Administered 2024-08-03 – 2024-08-06 (×4): 2 g via INTRAVENOUS
  Filled 2024-08-03 (×4): qty 20

## 2024-08-03 MED ORDER — ESCITALOPRAM OXALATE 10 MG PO TABS
20.0000 mg | ORAL_TABLET | Freq: Every day | ORAL | Status: DC
  Administered 2024-08-03 – 2024-08-07 (×5): 20 mg via ORAL
  Filled 2024-08-03 (×5): qty 2

## 2024-08-03 MED ORDER — ENOXAPARIN SODIUM 60 MG/0.6ML IJ SOSY
0.5000 mg/kg | PREFILLED_SYRINGE | INTRAMUSCULAR | Status: DC
  Administered 2024-08-03 – 2024-08-07 (×5): 47.5 mg via SUBCUTANEOUS
  Filled 2024-08-03 (×5): qty 0.6

## 2024-08-03 MED ORDER — BENZTROPINE MESYLATE 0.5 MG PO TABS
1.0000 mg | ORAL_TABLET | Freq: Two times a day (BID) | ORAL | Status: DC
  Administered 2024-08-03 – 2024-08-04 (×2): 1 mg via ORAL
  Filled 2024-08-03 (×2): qty 2

## 2024-08-03 MED ORDER — ONDANSETRON HCL 4 MG PO TABS: 4.0000 mg | ORAL_TABLET | Freq: Four times a day (QID) | ORAL | Status: DC | PRN

## 2024-08-03 MED ORDER — JUVEN PO PACK
1.0000 | PACK | Freq: Two times a day (BID) | ORAL | Status: DC
  Administered 2024-08-03 – 2024-08-07 (×8): 1 via ORAL

## 2024-08-03 MED ORDER — BUPRENORPHINE HCL 2 MG SL SUBL
2.0000 mg | SUBLINGUAL_TABLET | Freq: Three times a day (TID) | SUBLINGUAL | Status: DC
  Administered 2024-08-03 – 2024-08-07 (×13): 2 mg via SUBLINGUAL
  Filled 2024-08-03 (×13): qty 1

## 2024-08-03 MED ORDER — LURASIDONE HCL 80 MG PO TABS
120.0000 mg | ORAL_TABLET | Freq: Every evening | ORAL | Status: DC
  Filled 2024-08-03: qty 1

## 2024-08-03 MED ORDER — INSULIN ASPART 100 UNIT/ML IJ SOLN
0.0000 [IU] | Freq: Every day | INTRAMUSCULAR | Status: DC
  Administered 2024-08-03 – 2024-08-05 (×3): 2 [IU] via SUBCUTANEOUS
  Filled 2024-08-03 (×3): qty 1

## 2024-08-03 MED ORDER — ACETAMINOPHEN 325 MG PO TABS
650.0000 mg | ORAL_TABLET | Freq: Four times a day (QID) | ORAL | Status: DC | PRN
  Administered 2024-08-04 – 2024-08-07 (×4): 650 mg via ORAL
  Filled 2024-08-03 (×5): qty 2

## 2024-08-03 MED ORDER — PALIPERIDONE ER 3 MG PO TB24
3.0000 mg | ORAL_TABLET | Freq: Every evening | ORAL | Status: DC
  Administered 2024-08-03: 3 mg via ORAL
  Filled 2024-08-03 (×2): qty 1

## 2024-08-03 MED ORDER — INSULIN ASPART 100 UNIT/ML IJ SOLN
0.0000 [IU] | Freq: Three times a day (TID) | INTRAMUSCULAR | Status: DC
  Administered 2024-08-03: 3 [IU] via SUBCUTANEOUS
  Administered 2024-08-03: 2 [IU] via SUBCUTANEOUS
  Administered 2024-08-03: 5 [IU] via SUBCUTANEOUS
  Administered 2024-08-04 (×2): 2 [IU] via SUBCUTANEOUS
  Administered 2024-08-04 – 2024-08-05 (×2): 3 [IU] via SUBCUTANEOUS
  Administered 2024-08-05: 5 [IU] via SUBCUTANEOUS
  Administered 2024-08-05: 3 [IU] via SUBCUTANEOUS
  Administered 2024-08-06: 2 [IU] via SUBCUTANEOUS
  Administered 2024-08-06 – 2024-08-07 (×2): 3 [IU] via SUBCUTANEOUS
  Administered 2024-08-07: 1 [IU] via SUBCUTANEOUS
  Filled 2024-08-03 (×12): qty 1

## 2024-08-03 MED ORDER — PREGABALIN 75 MG PO CAPS
150.0000 mg | ORAL_CAPSULE | Freq: Three times a day (TID) | ORAL | Status: DC
Start: 2024-08-03 — End: 2024-08-04
  Administered 2024-08-03 – 2024-08-04 (×2): 150 mg via ORAL
  Filled 2024-08-03 (×2): qty 2

## 2024-08-03 MED ORDER — PANTOPRAZOLE SODIUM 40 MG PO TBEC
40.0000 mg | DELAYED_RELEASE_TABLET | Freq: Every day | ORAL | Status: DC
  Administered 2024-08-03 – 2024-08-07 (×5): 40 mg via ORAL
  Filled 2024-08-03 (×5): qty 1

## 2024-08-03 MED ORDER — POTASSIUM CHLORIDE 20 MEQ PO PACK
40.0000 meq | PACK | Freq: Once | ORAL | Status: AC
  Administered 2024-08-03: 40 meq via ORAL
  Filled 2024-08-03: qty 2

## 2024-08-03 MED ORDER — LURASIDONE HCL 40 MG PO TABS
120.0000 mg | ORAL_TABLET | Freq: Every evening | ORAL | Status: DC
  Administered 2024-08-03 – 2024-08-06 (×4): 120 mg via ORAL
  Filled 2024-08-03 (×5): qty 3

## 2024-08-03 MED ORDER — SODIUM CHLORIDE 0.9 % IV SOLN
1.0000 g | INTRAVENOUS | Status: DC
  Filled 2024-08-03: qty 10

## 2024-08-03 MED ORDER — POTASSIUM CHLORIDE IN NACL 40-0.9 MEQ/L-% IV SOLN
INTRAVENOUS | Status: DC
  Filled 2024-08-03: qty 1000

## 2024-08-03 MED ORDER — MIRTAZAPINE 15 MG PO TABS
30.0000 mg | ORAL_TABLET | Freq: Every day | ORAL | Status: DC
Start: 2024-08-03 — End: 2024-08-04
  Administered 2024-08-03: 30 mg via ORAL
  Filled 2024-08-03: qty 2

## 2024-08-03 MED ORDER — ENOXAPARIN SODIUM 40 MG/0.4ML IJ SOSY: 40.0000 mg | PREFILLED_SYRINGE | INTRAMUSCULAR | Status: DC

## 2024-08-03 MED ORDER — BUPRENORPHINE HCL 2 MG SL SUBL: 2.0000 mg | SUBLINGUAL_TABLET | Freq: Three times a day (TID) | SUBLINGUAL | Status: DC

## 2024-08-03 MED ORDER — ACETAMINOPHEN 650 MG RE SUPP: 650.0000 mg | Freq: Four times a day (QID) | RECTAL | Status: DC | PRN

## 2024-08-03 MED ORDER — ONDANSETRON HCL 4 MG/2ML IJ SOLN
4.0000 mg | Freq: Four times a day (QID) | INTRAMUSCULAR | Status: DC | PRN
  Administered 2024-08-05 – 2024-08-06 (×2): 4 mg via INTRAVENOUS
  Filled 2024-08-03 (×2): qty 2

## 2024-08-03 NOTE — H&P (Signed)
 History and Physical    Patient: Alicia Mayo FMW:[id number] DOB: Apr 24, 1966 DOA: 08/02/2024 DOS: the patient was seen and examined on 08/03/2024 PCP: Dante Elsie Celestia MADISON, MD  Patient coming from: Home  Chief Complaint:  Chief Complaint  Patient presents with   Weakness    HPI: Alicia Mayo is a 58 y.o. female with medical history significant for Insulin -dependent type 2 diabetes, diabetic gastroparesis, chronic pain on chronic narcotics, HTN, anxiety and depression, asthma being admitted with acute metabolic encephalopathy and urinary tract infection.  She presented to the ED with confusion and weakness in the setting of recent diarrhea.  At baseline she is able to care for herself.  She reportedly had been having hallucinations and received a new med a couple days prior by her psychiatrist.  Patient was evaluated by psychiatry while in the ED medical workup was requested. On arrival to the ED she had a low-grade temp of 99.4 soft blood pressure of 95/64 which improved with fluids to 169/99. Labs notable for normal WBC of 8.1 baseline hemoglobin of 10.8.  Blood glucose 214, potassium of 3, creatinine of 1.15 above baseline of 0.76 a couple months prior.  Magnesium  slightly low at 1.6.  Urinalysis suggesting UTI. EKG showed NSR at 91 CT head negative CT abdomen and pelvis without was nonacute.  Patient started on Rocephin  and given an NS bolus along with potassium and magnesium  repletion. Admission requested.     Past Medical History:  Diagnosis Date   Asthma    COPD (chronic obstructive pulmonary disease) (HCC)    Diabetes mellitus without complication (HCC)    Fibromyalgia    Hypertension    Stroke [facility]) 2023   2023, 2008 and others. 8 strokes. R hemiparesis   Past Surgical History:  Procedure Laterality Date   APPENDECTOMY     CHOLECYSTECTOMY     Social History:  reports that she has been smoking cigarettes. She has never used smokeless tobacco. She reports that she  does not currently use alcohol. She reports that she does not use drugs.  Allergies  Allergen Reactions   Celecoxib Other (See Comments) and Swelling    Other Reaction: OTHER REACTION = SWELLING   Gabapentin Rash and Swelling    Pt says she gets face and throat swelling.   Levetiracetam Rash   Oxycodone-Acetaminophen  Itching, Nausea Only and Swelling    Pt states that nausea was the most significant effect. Pt states that percocet is tolerable when given ondansetron  (ZOFRAN ).   Quetiapine Other (See Comments) and Rash    Increased blood sugars.   Shellfish Allergy Swelling    Scallops, shellfish => swelling   Soap Swelling    [place] soap   Acetaminophen  Nausea And Vomiting   Alprazolam Other (See Comments)    Memory loss   Atorvastatin     Other reaction(s): Muscle Pain   Diazepam Other (See Comments)    Memory loss   Dicyclomine Other (See Comments)    Over-sedation   Ibuprofen Nausea And Vomiting    Other Reaction: TONGUE SWELING/BLEEDING   Ivp Dye [Iodinated Contrast Media] Itching   Oxycodone Itching and Swelling    Swelling of tongue   Propoxyphene     With tylenol    Sertraline Other (See Comments)    Other reaction(s): Unknown unknown   Trazodone Other (See Comments)    Over-sedation   Acetaminophen -Codeine Itching and Nausea Only   Aspirin Nausea Only and Swelling    Sinus swelling    Codeine Itching and  Nausea Only    No associated rash. Itching stops on its own after a few days, but faster when given diphenhydramine.   Tramadol Swelling    Facial swelling   Zolpidem Nausea Only and Other (See Comments)    Causes memory loss    History reviewed. No pertinent family history.  Prior to Admission medications   Medication Sig Start Date End Date Taking? Authorizing Provider  furosemide  (LASIX ) 20 MG tablet Take 1 tablet (20 mg total) by mouth daily for 14 days. 05/20/24 06/03/24  Malvina Alm DASEN, MD  ondansetron  (ZOFRAN  ODT) 4 MG disintegrating tablet  Take 1 tablet (4 mg total) by mouth every 8 (eight) hours as needed. 03/09/21   Edelmiro Leash, MD  potassium chloride  SA (KLOR-CON  M) 20 MEQ tablet Take 1 tablet (20 mEq total) by mouth 2 (two) times daily for 14 days. 05/20/24 06/03/24  Malvina Alm DASEN, MD    Physical Exam: Vitals:   08/02/24 2119 08/02/24 2130 08/02/24 2230 08/02/24 2300  BP: 124/74 (!) 161/78 (!) 166/97 (!) 169/99  Pulse: 76 81 71 70  Resp: 17   12  Temp:      TempSrc:      SpO2: 100% 92% 98% 100%  Weight:      Height:       Physical Exam Vitals and nursing note reviewed.  Constitutional:      General: She is not in acute distress.    Comments: Somnolent, difficult to arouse but appears comfortable and in no distress  HENT:     Head: Normocephalic and atraumatic.  Cardiovascular:     Rate and Rhythm: Normal rate and regular rhythm.     Heart sounds: Normal heart sounds.  Pulmonary:     Effort: Pulmonary effort is normal.     Breath sounds: Normal breath sounds.  Abdominal:     Palpations: Abdomen is soft.     Tenderness: There is no abdominal tenderness.  Neurological:     General: No focal deficit present.     Labs on Admission: I have personally reviewed following labs and imaging studies  CBC: Recent Labs  Lab 08/02/24 1545  WBC 8.1  HGB 10.8*  HCT 33.2*  MCV 91.7  PLT 240   Basic Metabolic Panel: Recent Labs  Lab 08/02/24 1545  NA 138  K 3.0*  CL 98  CO2 29  GLUCOSE 214*  BUN 9  CREATININE 1.15*  CALCIUM  9.5  MG 1.6*   GFR: Estimated Creatinine Clearance: 64.8 mL/min (A) (by C-G formula based on SCr of 1.15 mg/dL (H)). Liver Function Tests: Recent Labs  Lab 08/02/24 1545  AST 18  ALT 15  ALKPHOS 63  BILITOT 0.7  PROT 7.9  ALBUMIN 3.9   No results for input(s): LIPASE, AMYLASE in the last 168 hours. No results for input(s): AMMONIA in the last 168 hours. Coagulation Profile: No results for input(s): INR, PROTIME in the last 168 hours. Cardiac Enzymes: No  results for input(s): CKTOTAL, CKMB, CKMBINDEX, TROPONINI in the last 168 hours. BNP (last 3 results) No results for input(s): PROBNP in the last 8760 hours. HbA1C: No results for input(s): HGBA1C in the last 72 hours. CBG: No results for input(s): GLUCAP in the last 168 hours. Lipid Profile: No results for input(s): CHOL, HDL, LDLCALC, TRIG, CHOLHDL, LDLDIRECT in the last 72 hours. Thyroid Function Tests: No results for input(s): TSH, T4TOTAL, FREET4, T3FREE, THYROIDAB in the last 72 hours. Anemia Panel: No results for input(s): VITAMINB12, FOLATE, FERRITIN, TIBC,  IRON, RETICCTPCT in the last 72 hours. Urine analysis:    Component Value Date/Time   COLORURINE YELLOW (A) 08/02/2024 2117   APPEARANCEUR HAZY (A) 08/02/2024 2117   LABSPEC 1.013 08/02/2024 2117   PHURINE 6.0 08/02/2024 2117   GLUCOSEU 50 (A) 08/02/2024 2117   HGBUR NEGATIVE 08/02/2024 2117   BILIRUBINUR NEGATIVE 08/02/2024 2117   KETONESUR NEGATIVE 08/02/2024 2117   PROTEINUR 30 (A) 08/02/2024 2117   NITRITE POSITIVE (A) 08/02/2024 2117   LEUKOCYTESUR SMALL (A) 08/02/2024 2117    Radiological Exams on Admission: CT ABDOMEN PELVIS WO CONTRAST Result Date: 08/02/2024 CLINICAL DATA:  Left lower quadrant pain. EXAM: CT ABDOMEN AND PELVIS WITHOUT CONTRAST TECHNIQUE: Multidetector CT imaging of the abdomen and pelvis was performed following the standard protocol without IV contrast. RADIATION DOSE REDUCTION: This exam was performed according to the departmental dose-optimization program which includes automated exposure control, adjustment of the mA and/or kV according to patient size and/or use of iterative reconstruction technique. COMPARISON:  July 01, 2023 FINDINGS: Lower chest: No acute abnormality. Hepatobiliary: No focal liver abnormality is seen. Status post cholecystectomy. No biliary dilatation. Pancreas: Unremarkable. No pancreatic ductal dilatation or surrounding  inflammatory changes. Spleen: Normal in size without focal abnormality. Adrenals/Urinary Tract: Adrenal glands are unremarkable. Kidneys are normal, without renal calculi, focal lesion, or hydronephrosis. Bladder is unremarkable. Stomach/Bowel: Stomach is within normal limits. Appendix appears normal. No evidence of bowel wall thickening, distention, or inflammatory changes. Vascular/Lymphatic: Aortic atherosclerosis. No enlarged abdominal or pelvic lymph nodes. Reproductive: Status post hysterectomy. No adnexal masses. Other: No abdominal wall hernia or abnormality. No abdominopelvic ascites. Musculoskeletal: No acute or significant osseous findings. IMPRESSION: 1. No acute or active process within the abdomen or pelvis. 2. Evidence of prior cholecystectomy and hysterectomy. 3. Aortic atherosclerosis. Electronically Signed   By: Suzen Dials M.D.   On: 08/02/2024 20:01   CT Head Wo Contrast Result Date: 08/02/2024 CLINICAL DATA:  Mental status change, unknown cause. Generalized weakness. EXAM: CT HEAD WITHOUT CONTRAST TECHNIQUE: Contiguous axial images were obtained from the base of the skull through the vertex without intravenous contrast. RADIATION DOSE REDUCTION: This exam was performed according to the departmental dose-optimization program which includes automated exposure control, adjustment of the mA and/or kV according to patient size and/or use of iterative reconstruction technique. COMPARISON:  Head CT 03/10/2024 FINDINGS: Brain: There is no evidence of an acute infarct, intracranial hemorrhage, mass, midline shift, or extra-axial fluid collection. Cerebral volume is normal. The ventricles are normal in size. Vascular: No hyperdense vessel. Skull: No fracture or suspicious lesion. Sinuses/Orbits: Minimal mucosal thickening in the included paranasal sinuses. Clear mastoid air cells. Unremarkable orbits. Other: None. IMPRESSION: Unremarkable CT appearance of the brain. Electronically Signed   By:  Dasie Hamburg M.D.   On: 08/02/2024 19:50   Data Reviewed for HPI: Relevant notes from primary care and specialist visits, past discharge summaries as available in EHR, including Care Everywhere. Prior diagnostic testing as pertinent to current admission diagnoses Updated medications and problem lists for reconciliation ED course, including vitals, labs, imaging, treatment and response to treatment Triage notes, nursing and pharmacy notes and ED provider's notes Notable results as noted above in HPI      Assessment and Plan: * Acute metabolic encephalopathy Possibly multifactorial and related to UTI, dehydration, medication Neurologic checks with fall and aspiration precautions Treat UTI Minimize sedating drugs  Urinary tract infection Rocephin  Follow cultures  Hypotension Essential hypertension Hold home antihypertensives and resume as appropriate BP improved with NS bolus  in the ED  Electrolyte abnormality Hypokalemia and hypomagnesemia Likely related to reported recent diarrhea Being repleted Continue to monitor  AKI (acute kidney injury) Creatinine 1.15 above baseline is 0.76 Hydrate and monitor Avoid nephrotoxins  Uncontrolled type 2 diabetes mellitus with hyperglycemia, with long-term current use of insulin  (HCC) Blood glucose 214 Sliding scale insulin  coverage  Chronic pain Fibromyalgia Continue home meds pending med rec.  Hold sedating meds if able  Bipolar II disorder [facility]) Recent hallucinations Awaiting med rec Consider behavioral health consult in the a.m. to follow if needed     DVT prophylaxis: Lovenox   Consults: none  Advance Care Planning: full code  Family Communication: none  Disposition Plan: Back to previous home environment  Severity of Illness: The appropriate patient status for this patient is OBSERVATION. Observation status is judged to be reasonable and necessary in order to provide the required intensity of service to ensure  the patient's safety. The patient's presenting symptoms, physical exam findings, and initial radiographic and laboratory data in the context of their medical condition is felt to place them at decreased risk for further clinical deterioration. Furthermore, it is anticipated that the patient will be medically stable for discharge from the hospital within 2 midnights of admission.   Author: Delayne LULLA Solian, MD 08/03/2024 1:35 AM  For on call review [email or web address].

## 2024-08-03 NOTE — Consult Note (Signed)
 [facility] Psychiatric Consult Initial  Patient Name: .Alicia Mayo  MRN: [id number]  DOB: 05-16-1966  Consult Order details:  Orders (From admission, onward)     Start     Ordered   08/02/24 2017  CONSULT TO CALL ACT TEAM       Ordering Provider: Suzanne Kirsch, MD  Provider:  (Not yet assigned)  Question:  Reason for Consult?  Answer:  Psych consult   08/02/24 2016   08/02/24 2017  IP CONSULT TO PSYCHIATRY       Ordering Provider: Suzanne Kirsch, MD  Provider:  (Not yet assigned)  Question:  Reason for consult:  Answer:  Medication management   08/02/24 2016             Mode of Visit: In person    Psychiatry Consult Evaluation  Service Date: August 03, 2024 LOS:  LOS: 0 days  Chief Complaint Hallucinations  Primary Psychiatric Diagnoses  R/o delirium secondary to UTI Bipolar I disorder    Assessment  Alicia Mayo is a 58 y.o. female admitted: Medically Alicia Mayo is a 58 y.o. female with medical history significant for Insulin -dependent type 2 diabetes, diabetic gastroparesis, chronic pain on chronic narcotics, HTN, anxiety and depression, asthma being admitted with acute metabolic encephalopathy and urinary tract infection.  She presented to the ED with confusion and weakness in the setting of recent diarrhea.  At baseline she is able to care for herself.  She reportedly had been having hallucinations and received a new med a couple days prior by her psychiatrist.  Patient was evaluated by psychiatry while in the ED medical workup was requested. On arrival to the ED she had a low-grade temp of 99.4 soft blood pressure of 95/64 which improved with fluids to 169/99. Labs notable for normal WBC of 8.1 baseline hemoglobin of 10.8.  Blood glucose 214, potassium of 3, creatinine of 1.15 above baseline of 0.76 a couple months prior.  Magnesium  slightly low at 1.6.  Urinalysis suggesting UTI. Per ED note, patient's caregiver [name] reported that patient was having worsening  hallucinations and confusion. He reported that she has a history of hallucinations and just was seen by outpatient psychiatry, and was given a new injection on Monday for her hallucinations and was told by the psychiatrist to come to the emergency department and be seen again if her hallucinations worsened. He states as of a couple weeks ago, patient was able to take care of herself.    On assessment today, patient is alert and oriented. Mental status has improved per chart review and per patient's home aide, who is at bedside and is familiar with patient's initial presentation. Patient provided consent for home aide to be involved in her care. Patient denies current hallucinations, though states she has not been awake for very long today. She denies SI/HI/plan. Patient does not meet criteria for IVC. Mental status has improved with treatment of UTI. We will continue to follow up with patient to evaluate for return to baseline.   Diagnoses:  Active Hospital problems: Principal Problem:   Acute metabolic encephalopathy Active Problems:   Fibromyalgia   Bipolar II disorder (HCC)   History of stroke   Hypotension   Urinary tract infection   Uncontrolled type 2 diabetes mellitus with hyperglycemia, with long-term current use of insulin  (HCC)   AKI (acute kidney injury)   Chronic pain   Electrolyte abnormality    Plan   ## Psychiatric Medication Recommendations:  Continue home medications  ## Medical Decision  Making Capacity: Not specifically addressed in this encounter  ## Further Work-up:   -- most recent EKG on 08/02/2024 had QtC of 504 -- Pertinent labwork reviewed earlier this admission includes: CMP, lactic acid, CBC, HbA1c, HIV screen, UA, blood culture, urine culture   ## Disposition:-- We will reevaluate patient prior to discharge to assess for return to baseline  ## Behavioral / Environmental: -Delirium Precautions: Delirium Interventions for Nursing and Staff: - RN to open  blinds every AM. - To Bedside: Glasses, hearing aide, and pt's own shoes. Make available to patients. when possible and encourage use. - Encourage po fluids when appropriate, keep fluids within reach. - OOB to chair with meals. - Passive ROM exercises to all extremities with AM & PM care. - RN to assess orientation to person, time and place QAM and PRN. - Recommend extended visitation hours with familiar family/friends as feasible. - Staff to minimize disturbances at night. Turn off television when pt asleep or when not in use.    ## Safety and Observation Level:  - Based on my clinical evaluation, I estimate the patient to be at low risk of self harm in the current setting. - At this time, we recommend  routine. This decision is based on my review of the chart including patient's history and current presentation, interview of the patient, mental status examination, and consideration of suicide risk including evaluating suicidal ideation, plan, intent, suicidal or self-harm behaviors, risk factors, and protective factors. This judgment is based on our ability to directly address suicide risk, implement suicide prevention strategies, and develop a safety plan while the patient is in the clinical setting. Please contact our team if there is a concern that risk level has changed.  CSSR Risk Category:C-SSRS RISK CATEGORY: No Risk  Suicide Risk Assessment: Patient has following modifiable risk factors for suicide: pain, medical illness (ie new dx of cancer), which we are addressing by inpatient hospitalization. Patient has following non-modifiable or demographic risk factors for suicide: none identified. Patient has the following protective factors against suicide: Access to outpatient mental health care  Thank you for this consult request. Recommendations have been communicated to the primary team.  We will continue to follow up, will recheck prior to discharge to assess for return to baseline at this  time.   Alicia Berrones LITTIE Lukes, PA-C       History of Present Illness  Relevant Aspects of [facility]   Patient Report:  Patient reports she is feeling better and has not acute psychiatric complaints at this time. Mental status has improved per chart review and per patient's home aide, who is at bedside and is familiar with patient's initial presentation. Patient provided consent for home aide to be involved in her care. She stated that she lives alone but an aide comes 8 hours daily from Monday through Friday, 5 hours on Saturday and then 4 hours on Sunday. She has help with transportation to attend medical appointments. She goes to [facility] in [place] for outpatient psychiatric care. She reports receiving an injection there on Monday 9/22 to treat hallucinations.  Patient denies current hallucinations, though states she has not been awake for very long today. She denies SI/HI/plan.   Psych ROS:  Depression: history of depressive symptoms  Anxiety:  Unknown  Mania (lifetime and current): unknown Psychosis: (lifetime and current): hallucinations   Psychiatric and Social History  Psychiatric History:  Information collected from the patient and chart review.   Prev Dx/Sx: Bipolar disorder  Current Psych  Provider: [facility]  Home Meds (current): Buspar, Lexapro , Invega , Latuda , Remeron , Risperdal Previous Med Trials: Unknown  Therapy: [facility]   Prior Psych Hospitalization: Unknown   Prior Self Harm: Unknown  Prior Violence: Unknown   Family Psych History: Unknown  Family Hx suicide: Unknown   Social History:   Educational Hx: Unknown  Occupational Hx: Unknown  Legal Hx: Unknown  Living Situation: Lives alone, aides come to home  Access to weapons/lethal means: Unknown     Exam Findings  Physical Exam: Reviewed and agree with the physical exam findings conducted by the medical provider.  Vital Signs:  Temp:  [97.7 F (36.5 C)-99.4 F (37.4 C)]  97.7 F (36.5 C) (09/25 0746) Pulse Rate:  [70-87] 81 (09/25 0746) Resp:  [12-18] 18 (09/25 0746) BP: (95-169)/(54-99) 135/77 (09/25 0746) SpO2:  [92 %-100 %] 97 % (09/25 0746) Weight:  [95.3 kg] 95.3 kg (09/24 1543) Blood pressure 135/77, pulse 81, temperature 97.7 F (36.5 C), resp. rate 18, height 5' 7 (1.702 m), weight 95.3 kg, SpO2 97%. Body mass index is 32.89 kg/m.    Mental Status Exam: General Appearance: Casual  Orientation:  Full (Time, Place, and Person)  Memory:  Immediate;   Fair Recent;   Fair Remote;   Fair  Concentration:  Concentration: Fair and Attention Span: Fair  Recall:  Fair  Attention  Fair  Eye Contact:  Fair  Speech:  Clear and Coherent  Language:  Fair  Volume:  Normal  Mood: Content  Affect:  Congruent  Thought Process:  Coherent  Thought Content:  WDL  Suicidal Thoughts:  No  Homicidal Thoughts:  No  Judgement:  Fair  Insight:  Fair  Psychomotor Activity:  Normal  Akathisia:  No  Fund of Knowledge:  Fair      Assets:  Manufacturing systems engineer Social Support  Cognition:  WNL  ADL's:  Intact  AIMS (if indicated):        Other History   These have been pulled in through the EMR, reviewed, and updated if appropriate.  Family History:  The patient's family history is not on file.  Medical History: Past Medical History:  Diagnosis Date   Asthma    COPD (chronic obstructive pulmonary disease) (HCC)    Diabetes mellitus without complication (HCC)    Fibromyalgia    Hypertension    Stroke [facility]) 2023   2023, 2008 and others. 8 strokes. R hemiparesis    Surgical History: Past Surgical History:  Procedure Laterality Date   APPENDECTOMY     CHOLECYSTECTOMY       Medications:   Current Facility-Administered Medications:    acetaminophen  (TYLENOL ) tablet 650 mg, 650 mg, Oral, Q6H PRN **OR** acetaminophen  (TYLENOL ) suppository 650 mg, 650 mg, Rectal, Q6H PRN, Cleatus Delayne GAILS, MD   buprenorphine  (SUBUTEX ) SL tablet 2 mg, 2 mg,  Sublingual, TID, Rashid, Farhan, MD, 2 mg at 08/03/24 1318   cefTRIAXone  (ROCEPHIN ) 1 g in sodium chloride  0.9 % 100 mL IVPB, 1 g, Intravenous, Q24H, Cleatus, Hazel V, MD   enoxaparin  (LOVENOX ) injection 47.5 mg, 0.5 mg/kg, Subcutaneous, Q24H, Cleatus Delayne V, MD, 47.5 mg at 08/03/24 9166   insulin  aspart (novoLOG ) injection 0-15 Units, 0-15 Units, Subcutaneous, TID WC, Cleatus Delayne GAILS, MD, 5 Units at 08/03/24 1318   insulin  aspart (novoLOG ) injection 0-5 Units, 0-5 Units, Subcutaneous, QHS, Duncan, Hazel V, MD   lurasidone  (LATUDA ) tablet 120 mg, 120 mg, Oral, QPM, Hunt, Madison H, RPH   nutrition supplement (JUVEN) (JUVEN) powder packet  1 packet, 1 packet, Oral, BID BM, Rashid, Farhan, MD, 1 packet at 08/03/24 1318   ondansetron  (ZOFRAN ) tablet 4 mg, 4 mg, Oral, Q6H PRN **OR** ondansetron  (ZOFRAN ) injection 4 mg, 4 mg, Intravenous, Q6H PRN, Cleatus Delayne GAILS, MD   paliperidone  (INVEGA ) 24 hr tablet 3 mg, 3 mg, Oral, QPM, Rashid, Farhan, MD   pantoprazole  (PROTONIX ) EC tablet 40 mg, 40 mg, Oral, Daily, Rashid, Farhan, MD, 40 mg at 08/03/24 1318  Allergies: Allergies  Allergen Reactions   Celecoxib Other (See Comments) and Swelling    Other Reaction: OTHER REACTION = SWELLING   Gabapentin Rash and Swelling    Pt says she gets face and throat swelling.   Levetiracetam Rash   Oxycodone-Acetaminophen  Itching, Nausea Only and Swelling    Pt states that nausea was the most significant effect. Pt states that percocet is tolerable when given ondansetron  (ZOFRAN ).   Quetiapine Other (See Comments) and Rash    Increased blood sugars.   Shellfish Allergy Swelling    Scallops, shellfish => swelling   Soap Swelling    [place] soap   Acetaminophen  Nausea And Vomiting   Alprazolam Other (See Comments)    Memory loss   Atorvastatin     Other reaction(s): Muscle Pain   Diazepam Other (See Comments)    Memory loss   Dicyclomine Other (See Comments)    Over-sedation   Ibuprofen Nausea And  Vomiting    Other Reaction: TONGUE SWELING/BLEEDING   Ivp Dye [Iodinated Contrast Media] Itching   Oxycodone Itching and Swelling    Swelling of tongue   Propoxyphene     With tylenol    Sertraline Other (See Comments)    Other reaction(s): Unknown unknown   Trazodone Other (See Comments)    Over-sedation   Acetaminophen -Codeine Itching and Nausea Only   Aspirin Nausea Only and Swelling    Sinus swelling    Codeine Itching and Nausea Only    No associated rash. Itching stops on its own after a few days, but faster when given diphenhydramine.   Tramadol Swelling    Facial swelling   Zolpidem Nausea Only and Other (See Comments)    Causes memory loss    Charlie Char LITTIE Lukes, PA-C

## 2024-08-03 NOTE — Evaluation (Signed)
 Occupational Therapy Evaluation Patient Details Name: Alicia Mayo MRN: [id number] DOB: 1966/01/01 Today's Date: 08/03/2024   History of Present Illness   58 y.o. female with medical history significant for Insulin -dependent type 2 diabetes, diabetic gastroparesis, chronic pain on chronic narcotics, HTN, anxiety and depression, asthma being admitted with acute metabolic encephalopathy and urinary tract infection.  She presented to the ED with confusion and weakness in the setting of recent diarrhea.  At baseline she is able to care for herself.  She reportedly had been having hallucinations and received a new med a couple days prior by her psychiatrist.     Clinical Impressions Patient presenting with decreased Ind in self care, functional mobility/transfers, endurance, strength, and safety awareness. Patient reports living at home alone and uses a variety of devices for mobility depending on how she feels (cane vs RW vs electric scooter). Pt has an aide that comes 8 hours M-F, 5 hours Saturday, and 4 hours on Sunday to assist with ADLs and IADLs. Pt reports being able to care for herself all the other times aide is not present to assist.  Patient currently functioning at min A for bed mobility and performs stand pivot with RW to [facility] for toileting needs with use of RW. Pt able to perform hygiene while seated with set up A. Pt returning back to bed in same manner as above. She endorses feeling  20%. Patient will benefit from acute OT to increase overall independence in the areas of ADLs, functional mobility, and safety awareness in order to safely discharge.     If plan is discharge home, recommend the following:   A little help with walking and/or transfers;A lot of help with bathing/dressing/bathroom;Assistance with cooking/housework;Assist for transportation;Help with stairs or ramp for entrance     Functional Status Assessment   Patient has had a recent decline in their functional status  and demonstrates the ability to make significant improvements in function in a reasonable and predictable amount of time.     Equipment Recommendations   Other (comment) (defer to next venue of care)      Precautions/Restrictions   Precautions Precautions: Fall     Mobility Bed Mobility Overal bed mobility: Needs Assistance Bed Mobility: Supine to Sit, Sit to Supine     Supine to sit: Min assist Sit to supine: Min assist        Transfers Overall transfer level: Needs assistance Equipment used: 1 person hand held assist Transfers: Sit to/from Stand, Bed to chair/wheelchair/BSC Sit to Stand: Min assist     Step pivot transfers: Min assist            Balance Overall balance assessment: Needs assistance Sitting-balance support: Feet supported Sitting balance-Leahy Scale: Good     Standing balance support: Bilateral upper extremity supported, During functional activity Standing balance-Leahy Scale: Fair                             ADL either performed or assessed with clinical judgement   ADL Overall ADL's : Needs assistance/impaired                         Toilet Transfer: Minimal assistance;BSC/3in1;Rolling walker (2 wheels)   Toileting- Clothing Manipulation and Hygiene: Minimal assistance;Sit to/from stand               Vision Patient Visual Report: No change from baseline  Pertinent Vitals/Pain Pain Assessment Pain Assessment: No/denies pain     Extremity/Trunk Assessment Upper Extremity Assessment Upper Extremity Assessment: Generalized weakness   Lower Extremity Assessment Lower Extremity Assessment: Generalized weakness       Communication Communication Communication: No apparent difficulties   Cognition Arousal: Alert, Lethargic Behavior During Therapy: Flat affect Cognition: No family/caregiver present to determine baseline             OT - Cognition Comments: slow processing                  Following commands: Impaired Following commands impaired: Only follows one step commands consistently, Follows one step commands with increased time     Cueing  General Comments   Cueing Techniques: Verbal cues;Gestural cues;Tactile cues;Visual cues              Home Living Family/patient expects to be discharged to:: Private residence Living Arrangements: Alone Available Help at Discharge: Personal care attendant;Available PRN/intermittently Type of Home: House Home Access: Stairs to enter [employer] of Steps: 3-4 Entrance Stairs-Rails: Right;Left Home Layout: One level     Bathroom Shower/Tub: Tub/shower unit         Home Equipment: Agricultural consultant (2 wheels);Cane - single point;Shower seat;Electric scooter;Wheelchair - manual          Prior Functioning/Environment Prior Level of Function : Needs assist               ADLs Comments: Pt has an aide that comes M-F for 8 hours (divided up during the day), Sat 5 hrs, and Sun 4 hours to assist with IADLs and ADL tasks. Pt endorses ambulation with cane, RW, or electric scooter depending on how she feels each day.    OT Problem List: Decreased strength;Impaired balance (sitting and/or standing);Decreased safety awareness;Decreased activity tolerance   OT Treatment/Interventions: Self-care/ADL training;Therapeutic exercise;Balance training;Therapeutic activities;DME and/or AE instruction      OT Goals(Current goals can be found in the care plan section)   Acute Rehab OT Goals Patient Stated Goal: to get stronger OT Goal Formulation: With patient Time For Goal Achievement: 08/17/24 Potential to Achieve Goals: Fair ADL Goals Pt Will Perform Grooming: with modified independence;standing Pt Will Perform Lower Body Dressing: with supervision;sit to/from stand Pt Will Transfer to Toilet: with modified independence;ambulating Pt Will Perform Toileting - Clothing Manipulation and  hygiene: with modified independence;sit to/from stand   OT Frequency:  Min 2X/week       AM-PAC OT 6 Clicks Daily Activity     Outcome Measure Help from another person eating meals?: None Help from another person taking care of personal grooming?: A Little Help from another person toileting, which includes using toliet, bedpan, or urinal?: A Little Help from another person bathing (including washing, rinsing, drying)?: A Little Help from another person to put on and taking off regular upper body clothing?: A Little Help from another person to put on and taking off regular lower body clothing?: A Little 6 Click Score: 19   End of Session Equipment Utilized During Treatment: Rolling walker (2 wheels);Other (comment) (BSC)  Activity Tolerance: Patient tolerated treatment well Patient left: in bed;with call bell/phone within reach;with bed alarm set  OT Visit Diagnosis: Unsteadiness on feet (R26.81);Muscle weakness (generalized) (M62.81)                Time: 8564-8542 OT Time Calculation (min): 22 min Charges:  OT General Charges $OT Visit: 1 Visit OT Evaluation $OT Eval Moderate Complexity: 1 Mod OT Treatments $Self  Care/Home Management : 8-22 mins  Izetta Claude, MS, OTR/L , CBIS ascom [phone]  08/03/24, 3:22 PM

## 2024-08-03 NOTE — Care Management Obs Status (Signed)
 MEDICARE OBSERVATION STATUS NOTIFICATION   Patient Details  Name: Alicia Mayo MRN: [id number] Date of Birth: [birth year]   Medicare Observation Status Notification Given: yes      Alicia Mayo 08/03/2024, 1:21 PM

## 2024-08-03 NOTE — Consult Note (Signed)
 PHARMACY - PHYSICIAN COMMUNICATION CRITICAL VALUE ALERT - BLOOD CULTURE IDENTIFICATION (BCID)  Alicia Mayo is an 57 y.o. female who presented to [facility] on 08/02/2024 with a chief complaint of generalized weakness.  Assessment:  1/2 positive for Staph spp. secondary to possible urinary source (Ucx pending)  Name of physician (or Provider) Contacted: Dr. Cleatus  Current antibiotics: ceftriaxone  1g q24h  Changes to prescribed antibiotics recommended:  Patient is on recommended antibiotics - No changes needed  No results found for this or any previous visit.  [name] 08/03/2024  7:47 PM

## 2024-08-03 NOTE — Assessment & Plan Note (Addendum)
 Recent hallucinations Awaiting med rec Consider behavioral health consult in the a.m. to follow if needed

## 2024-08-03 NOTE — Assessment & Plan Note (Signed)
 Fibromyalgia Continue home meds pending med rec.  Hold sedating meds if able

## 2024-08-03 NOTE — Progress Notes (Signed)
       CROSS COVER NOTE  NAME: Alicia Mayo MRN: [id number] DOB : 07-Jun-1966    Concern as stated by nurse / staff   Tylenol  not helping with generalized pain. Can we offer something else prn?      Pertinent findings on chart review: Patient admitted with encephalopathy  Patient Assessment   Assessment and  Interventions   Assessment:  Chronic pain and fibromyalgia Acute metabolic encephalopathy-possibly polypharmacy related  Plan: Home Lyrica  and Suboxone restarted Monitor mentation X

## 2024-08-03 NOTE — Assessment & Plan Note (Signed)
 Hypokalemia and hypomagnesemia Likely related to reported recent diarrhea Being repleted Continue to monitor

## 2024-08-03 NOTE — Progress Notes (Signed)
  PROGRESS NOTE    Alicia Mayo  FMW:[id number] DOB: Jun 15, 1966 DOA: 08/02/2024 PCP: Dante Elsie Celestia MADISON, MD   Same-day admission note:  Patient was seen and examined at the bedside.  She said that she feels better today.  She told me that she lives at home by herself but she has an aide that comes 8 hours every day from Monday through Friday, 5 hours on Saturday and then 4 hours on Sunday.  She said that she has been feeling tired lately and has been hurting all over.  We discussed about her diagnosis of urinary tract infection, acute kidney injury as well as metabolic encephalopathy.  Psych team is on board and we appreciate their assistance on the case.  Case discussed with Zelda Sharps, NP psychiatry. Hypokalemia is being repleted    Deliliah Room, MD [facility]  If 7PM-7AM, please contact night-coverage  08/03/2024, 9:46 AM

## 2024-08-03 NOTE — Inpatient Diabetes Management (Signed)
 Inpatient Diabetes Program Recommendations  AACE/ADA: New Consensus Statement on Inpatient Glycemic Control  Target Ranges:  Prepandial:   less than 140 mg/dL      Peak postprandial:   less than 180 mg/dL (1-2 hours)      Critically ill patients:  140 - 180 mg/dL    Latest Reference Range & Units 08/03/24 01:49 08/03/24 07:45 08/03/24 11:46  Glucose-Capillary 70 - 99 mg/dL 848 (H) 850 (H) 795 (H)    Latest Reference Range & Units 08/02/24 15:45 08/03/24 05:03  CO2 22 - 32 mmol/L 29 26  Glucose 70 - 99 mg/dL 785 (H) 847 (H)  Anion gap 5 - 15  11 16  (H)    Latest Reference Range & Units 06/13/24  Care Everywhere 08/03/24 05:03  Hemoglobin A1C 4.8 - 5.6 % 12.0 10.3 (H)   Review of Glycemic Control  Diabetes history: DM2 Outpatient Diabetes medications: Lantus  52 units BID, Novolog  18-28 units TID with meals, Metformin XR 1000 mg BID, Jardiance 10 mg daily (meds per PCP note on 07/04/24) Current orders for Inpatient glycemic control: Novolog  0-15 units TID with meals, Novolog  0-5 units QHS  Inpatient Diabetes Program Recommendations:    Insulin : Please consider ordering Lantus  10 units Q24H.  HbgA1C: A1C 10.3% on 08/03/24 indicating an average glucose of 249 mg/dl. Prior A1C 12% on 06/13/24 (per Care Everywhere).  Outpatient DM: If patient is still taking Jardiance, would recommend to discontinue due to UTI.  NOTE: Patient admitted with UTI, AME, AKI. Per chart review, noted patient seen Dr. Darcey ([facility]) on 12/21/23 and per office note, was asked to keep Mtformin XR 1000 BID, Lantus  52 units BID, Novolog  24 units with meals (if smaller meal take 12 units); noted Jardiance had been stopped due to recurrent urinary issues).  Patient seen Alicia Mayo, CPP on 07/04/24 and per note patient was taking Metformin XR 1000 mg BID, Jardiance 10 mg daily, and also told to   Switch to Dexcom G7 sensors when shipped in. Dexcom G7 app set up on phone.  -Continue Lantus   (insulin  glargine) 52 units 2 times daily. Instructed not to take if BS is < 100 mg/dL.  -Continue Novolog  with meals. Instructed it is okay to skip if blood sugar is under 150 mg/dL. New scale given below:  Blood sugar 150-200: Novolog  18 units Blood sugar 201-250: Novolog  20 units Blood sugar 251-300: Novolog  22 units Blood sugar 301-350: Novolog  24 units Blood sugar 351-400: Novolog  26 units Blood sugar over 400: Novolog  28 units   Thanks, Earnie Gainer, RN, MSN, CDCES Diabetes Coordinator Inpatient Diabetes Program [phone] (Team Pager from 8am to 5pm)

## 2024-08-03 NOTE — Progress Notes (Signed)
 Anticoagulation monitoring(Lovenox ):  58 yo female ordered Lovenox  40 mg Q24h    Filed Weights   08/02/24 1543  Weight: 95.3 kg (210 lb)   BMI 32.9    Lab Results  Component Value Date   CREATININE 1.15 (H) 08/02/2024   CREATININE 0.76 05/20/2024   CREATININE 1.12 (H) 03/10/2024   Estimated Creatinine Clearance: 64.8 mL/min (A) (by C-G formula based on SCr of 1.15 mg/dL (H)). Hemoglobin & Hematocrit     Component Value Date/Time   HGB 10.8 (L) 08/02/2024 1545   HCT 33.2 (L) 08/02/2024 1545     Per Protocol for Patient with estCrcl > 30 ml/min and BMI > 30, will transition to Lovenox  47.5 mg Q24h.

## 2024-08-03 NOTE — Assessment & Plan Note (Signed)
 Blood glucose 214 Sliding scale insulin  coverage

## 2024-08-03 NOTE — Assessment & Plan Note (Signed)
 Rocephin   Follow cultures

## 2024-08-03 NOTE — Care Management Obs Status (Signed)
 MEDICARE OBSERVATION STATUS NOTIFICATION   Patient Details  Name: Alicia Mayo MRN: [id number] Date of Birth: 21-Jun-1966   Medicare Observation Status Notification Given:  Yes    Rojelio [name] 08/03/2024, 1:22 PM

## 2024-08-03 NOTE — Assessment & Plan Note (Signed)
 Creatinine 1.15 above baseline is 0.76 Hydrate and monitor Avoid nephrotoxins

## 2024-08-03 NOTE — Assessment & Plan Note (Signed)
 Essential hypertension Hold home antihypertensives and resume as appropriate BP improved with NS bolus in the ED

## 2024-08-03 NOTE — Progress Notes (Signed)
 PT Cancellation Note  Patient Details Name: Alicia Mayo MRN: [id number] DOB: [birth year]   Cancelled Treatment:    Reason Eval/Treat Not Completed: Fatigue/lethargy limiting ability to participate (Consult received and chart reviewed.  Patient sleeping soundly.  Opens eyes briefly to max cuing (voice, light touch, elevated HOB, lights, cool cloth), but unable to maintain for adequate participation with session.  Will reattempt at later time/date)  Josette DEL. Delores, PT, DPT, NCS 08/03/24, 11:45 AM [phone]

## 2024-08-03 NOTE — Assessment & Plan Note (Signed)
 Possibly multifactorial and related to UTI, dehydration, medication Neurologic checks with fall and aspiration precautions Treat UTI Minimize sedating drugs

## 2024-08-03 NOTE — Progress Notes (Signed)
 Psychiatric evaluation was attempted; however, the patient appeared confused with unintelligible speech. Urinalysis indicates a likely urinary tract infection. Emergency Department Physician (EDP) has been notified.

## 2024-08-03 NOTE — Progress Notes (Signed)
 Unable to complete admission in it's entirety due to pt's altered mental status and lethargy at this time.

## 2024-08-04 DIAGNOSIS — Z91013 Allergy to seafood: Secondary | ICD-10-CM | POA: Diagnosis not present

## 2024-08-04 DIAGNOSIS — Z885 Allergy status to narcotic agent status: Secondary | ICD-10-CM | POA: Diagnosis not present

## 2024-08-04 DIAGNOSIS — N390 Urinary tract infection, site not specified: Secondary | ICD-10-CM | POA: Diagnosis present

## 2024-08-04 DIAGNOSIS — Z794 Long term (current) use of insulin: Secondary | ICD-10-CM | POA: Diagnosis not present

## 2024-08-04 DIAGNOSIS — Z91048 Other nonmedicinal substance allergy status: Secondary | ICD-10-CM | POA: Diagnosis not present

## 2024-08-04 DIAGNOSIS — I10 Essential (primary) hypertension: Secondary | ICD-10-CM | POA: Diagnosis present

## 2024-08-04 DIAGNOSIS — M797 Fibromyalgia: Secondary | ICD-10-CM | POA: Diagnosis present

## 2024-08-04 DIAGNOSIS — F319 Bipolar disorder, unspecified: Secondary | ICD-10-CM | POA: Diagnosis not present

## 2024-08-04 DIAGNOSIS — Z79899 Other long term (current) drug therapy: Secondary | ICD-10-CM | POA: Diagnosis not present

## 2024-08-04 DIAGNOSIS — F05 Delirium due to known physiological condition: Secondary | ICD-10-CM | POA: Diagnosis not present

## 2024-08-04 DIAGNOSIS — G9341 Metabolic encephalopathy: Secondary | ICD-10-CM | POA: Diagnosis present

## 2024-08-04 DIAGNOSIS — Z888 Allergy status to other drugs, medicaments and biological substances status: Secondary | ICD-10-CM | POA: Diagnosis not present

## 2024-08-04 DIAGNOSIS — F29 Unspecified psychosis not due to a substance or known physiological condition: Secondary | ICD-10-CM | POA: Diagnosis present

## 2024-08-04 DIAGNOSIS — Z8673 Personal history of transient ischemic attack (TIA), and cerebral infarction without residual deficits: Secondary | ICD-10-CM | POA: Diagnosis not present

## 2024-08-04 DIAGNOSIS — E876 Hypokalemia: Secondary | ICD-10-CM | POA: Diagnosis present

## 2024-08-04 DIAGNOSIS — K3184 Gastroparesis: Secondary | ICD-10-CM | POA: Diagnosis present

## 2024-08-04 DIAGNOSIS — G8929 Other chronic pain: Secondary | ICD-10-CM | POA: Diagnosis present

## 2024-08-04 DIAGNOSIS — F17210 Nicotine dependence, cigarettes, uncomplicated: Secondary | ICD-10-CM | POA: Diagnosis present

## 2024-08-04 DIAGNOSIS — E785 Hyperlipidemia, unspecified: Secondary | ICD-10-CM | POA: Diagnosis present

## 2024-08-04 DIAGNOSIS — N179 Acute kidney failure, unspecified: Secondary | ICD-10-CM | POA: Diagnosis present

## 2024-08-04 DIAGNOSIS — Z91041 Radiographic dye allergy status: Secondary | ICD-10-CM | POA: Diagnosis not present

## 2024-08-04 DIAGNOSIS — R531 Weakness: Secondary | ICD-10-CM | POA: Diagnosis present

## 2024-08-04 DIAGNOSIS — J4489 Other specified chronic obstructive pulmonary disease: Secondary | ICD-10-CM | POA: Diagnosis present

## 2024-08-04 DIAGNOSIS — F3181 Bipolar II disorder: Secondary | ICD-10-CM | POA: Diagnosis present

## 2024-08-04 DIAGNOSIS — E1165 Type 2 diabetes mellitus with hyperglycemia: Secondary | ICD-10-CM | POA: Diagnosis present

## 2024-08-04 DIAGNOSIS — E1143 Type 2 diabetes mellitus with diabetic autonomic (poly)neuropathy: Secondary | ICD-10-CM | POA: Diagnosis present

## 2024-08-04 LAB — BASIC METABOLIC PANEL WITH GFR
Anion gap: 13 (ref 5–15)
BUN: 8 mg/dL (ref 6–20)
CO2: 28 mmol/L (ref 22–32)
Calcium: 9.3 mg/dL (ref 8.9–10.3)
Chloride: 100 mmol/L (ref 98–111)
Creatinine, Ser: 0.77 mg/dL (ref 0.44–1.00)
GFR, Estimated: >60 mL/min (ref >60)
Glucose, Bld: 219 mg/dL — ABNORMAL HIGH (ref 70–99)
Potassium: 3.1 mmol/L — ABNORMAL LOW (ref 3.5–5.1)
Sodium: 141 mmol/L (ref 135–145)

## 2024-08-04 LAB — GLUCOSE, CAPILLARY
Glucose-Capillary: 137 mg/dL — ABNORMAL HIGH (ref 70–99)
Glucose-Capillary: 197 mg/dL — ABNORMAL HIGH (ref 70–99)
Glucose-Capillary: 141 mg/dL — ABNORMAL HIGH (ref 70–99)
Glucose-Capillary: 223 mg/dL — ABNORMAL HIGH (ref 70–99)

## 2024-08-04 MED ORDER — ROSUVASTATIN CALCIUM 10 MG PO TABS
5.0000 mg | ORAL_TABLET | Freq: Every day | ORAL | Status: DC
  Administered 2024-08-04 – 2024-08-07 (×4): 5 mg via ORAL
  Filled 2024-08-04 (×4): qty 1

## 2024-08-04 MED ORDER — INSULIN GLARGINE 100 UNIT/ML ~~LOC~~ SOLN
10.0000 [IU] | Freq: Every day | SUBCUTANEOUS | Status: DC
  Administered 2024-08-04 – 2024-08-07 (×4): 10 [IU] via SUBCUTANEOUS
  Filled 2024-08-04 (×4): qty 0.1

## 2024-08-04 MED ORDER — PALIPERIDONE ER 3 MG PO TB24
6.0000 mg | ORAL_TABLET | Freq: Every evening | ORAL | Status: DC
  Administered 2024-08-04 – 2024-08-06 (×3): 6 mg via ORAL
  Filled 2024-08-04 (×4): qty 2

## 2024-08-04 MED ORDER — LOSARTAN POTASSIUM 50 MG PO TABS
100.0000 mg | ORAL_TABLET | Freq: Every day | ORAL | Status: DC
  Administered 2024-08-04 – 2024-08-07 (×4): 100 mg via ORAL
  Filled 2024-08-04 (×4): qty 2

## 2024-08-04 MED ORDER — BENZTROPINE MESYLATE 0.5 MG PO TABS: 0.5000 mg | ORAL_TABLET | Freq: Two times a day (BID) | ORAL | Status: DC | PRN

## 2024-08-04 NOTE — TOC CM/SW Note (Signed)
 Transition of Care [facility]) - Inpatient Brief Assessment   Patient Details  Name: Anaise Sterbenz MRN: [id number] Date of Birth: 1965-12-05  Transition of Care [facility]) CM/SW Contact:    Alfonso Rummer, LCSW Phone Number: 08/04/2024, 10:27 AM   Clinical Narrative:  KEN DELENA Rummer completed TOC chart review. No Toc needs identified please contact should needs arise.   Transition of Care Asessment:

## 2024-08-04 NOTE — Consult Note (Addendum)
 [facility] Psychiatric Consult Follow-up  Patient Name: .Alicia Mayo  MRN: [id number]  DOB: [birth year]  Consult Order details:  Orders (From admission, onward)     Start     Ordered   08/02/24 2017  CONSULT TO CALL ACT TEAM       Ordering Provider: Suzanne Kirsch, MD  Provider:  (Not yet assigned)  Question:  Reason for Consult?  Answer:  Psych consult   08/02/24 2016   08/02/24 2017  IP CONSULT TO PSYCHIATRY       Ordering Provider: Suzanne Kirsch, MD  Provider:  (Not yet assigned)  Question:  Reason for consult:  Answer:  Medication management   08/02/24 2016             Mode of Visit: In person    Psychiatry Consult Evaluation  Service Date: August 04, 2024 LOS:  LOS: 0 days  Chief Complaint Hallucinations  Primary Psychiatric Diagnoses  R/o delirium secondary to UTI Bipolar I disorder    Assessment  Alicia Mayo is a 58 y.o. female admitted: Medically Alicia Mayo is a 58 y.o. female with medical history significant for Insulin -dependent type 2 diabetes, diabetic gastroparesis, chronic pain on chronic narcotics, HTN, anxiety and depression, asthma being admitted with acute metabolic encephalopathy and urinary tract infection.  She presented to the ED with confusion and weakness in the setting of recent diarrhea.  At baseline she is able to care for herself.  She reportedly had been having hallucinations and received a new med a couple days prior by her psychiatrist.  Patient was evaluated by psychiatry while in the ED medical workup was requested. On arrival to the ED she had a low-grade temp of 99.4 soft blood pressure of 95/64 which improved with fluids to 169/99. Labs notable for normal WBC of 8.1 baseline hemoglobin of 10.8.  Blood glucose 214, potassium of 3, creatinine of 1.15 above baseline of 0.76 a couple months prior.  Magnesium  slightly low at 1.6.  Urinalysis suggesting UTI. Per ED note, patient's caregiver [name] reported that patient was having worsening  hallucinations and confusion. He reported that she has a history of hallucinations and just was seen by outpatient psychiatry, and was given a new injection on Monday for her hallucinations and was told by the psychiatrist to come to the emergency department and be seen again if her hallucinations worsened. He states as of a couple weeks ago, patient was able to take care of herself.    On follow-up assessment today, patient's mental status has improved with treatment of UTI. Patient reports visual hallucinations of seeing cats in her room as she falls asleep. She reports possible auditory hallucinations of hearing laughter and hearing noises in her bathroom, though these noises are plausible in current setting. She denies hearing voices. She denies SI/HI/plan. Patient does not meet criteria for IVC. Psychotropic medications adjusted as below. We will continue to follow up with the patient at this time.   Diagnoses:  Active Hospital problems: Principal Problem:   Acute metabolic encephalopathy Active Problems:   Fibromyalgia   Bipolar II disorder (HCC)   History of stroke   Hypotension   Urinary tract infection   Uncontrolled type 2 diabetes mellitus with hyperglycemia, with long-term current use of insulin  (HCC)   AKI (acute kidney injury)   Chronic pain   Electrolyte abnormality   Altered mental status    Plan   ## Psychiatric Medication Recommendations:  Increased Invega  to 6 mg; if patient responds well to Invega , will  plan to reduce Latuda  dose Cogentin  modified to 0.5 mg twice daily as needed for tremors/EPS to remove anticholinergic delirium   ## Medical Decision Making Capacity: Not specifically addressed in this encounter  ## Further Work-up:   -- most recent EKG on 08/02/2024 had QtC of 504 -- Pertinent labwork reviewed earlier this admission includes: CMP, lactic acid, CBC, HbA1c, HIV screen, UA, blood culture, urine culture   ## Disposition:-- We will reevaluate patient  prior to discharge to assess for return to baseline  ## Behavioral / Environmental: -Delirium Precautions: Delirium Interventions for Nursing and Staff: - RN to open blinds every AM. - To Bedside: Glasses, hearing aide, and pt's own shoes. Make available to patients. when possible and encourage use. - Encourage po fluids when appropriate, keep fluids within reach. - OOB to chair with meals. - Passive ROM exercises to all extremities with AM & PM care. - RN to assess orientation to person, time and place QAM and PRN. - Recommend extended visitation hours with familiar family/friends as feasible. - Staff to minimize disturbances at night. Turn off television when pt asleep or when not in use.    ## Safety and Observation Level:  - Based on my clinical evaluation, I estimate the patient to be at low risk of self harm in the current setting. - At this time, we recommend  routine. This decision is based on my review of the chart including patient's history and current presentation, interview of the patient, mental status examination, and consideration of suicide risk including evaluating suicidal ideation, plan, intent, suicidal or self-harm behaviors, risk factors, and protective factors. This judgment is based on our ability to directly address suicide risk, implement suicide prevention strategies, and develop a safety plan while the patient is in the clinical setting. Please contact our team if there is a concern that risk level has changed.  CSSR Risk Category:C-SSRS RISK CATEGORY: No Risk  Suicide Risk Assessment: Patient has following modifiable risk factors for suicide: pain, medical illness (ie new dx of cancer), which we are addressing by inpatient hospitalization. Patient has following non-modifiable or demographic risk factors for suicide: none identified. Patient has the following protective factors against suicide: Access to outpatient mental health care  Thank you for this consult request.  Recommendations have been communicated to the primary team.  We will continue to follow up at this time.   Shigeru Lampert LITTIE Lukes, PA-C       History of Present Illness  Relevant Aspects of [facility]   Patient Report:  Patient reports she is feeling better, mental status has improved. Patient reports visual hallucinations of seeing cats in her room as she falls asleep. She reports possible auditory hallucinations of hearing laughter and hearing noises in her bathroom. She denies hearing voices. She denies SI/HI/plan.  She reported that she lives alone but an aide comes 8 hours daily from Monday through Friday, 5 hours on Saturday and then 4 hours on Sunday. She has help with transportation to attend medical appointments. She goes to [facility] in [place] for outpatient psychiatric care. She reported receiving an injection there on Monday 9/22 to treat hallucinations.   Psych ROS:  Depression: history of depressive symptoms  Anxiety:  Unknown  Mania (lifetime and current): unknown Psychosis: (lifetime and current): hallucinations   Psychiatric and Social History  Psychiatric History:  Information collected from the patient and chart review.   Prev Dx/Sx: Bipolar disorder  Current Psych Provider: [facility]  Home Meds (current): Buspar, Lexapro ,  Invega , Latuda , Remeron , Risperdal Previous Med Trials: Unknown  Therapy: [facility]   Prior Psych Hospitalization: Unknown   Prior Self Harm: Unknown  Prior Violence: Unknown   Family Psych History: Unknown  Family Hx suicide: Unknown   Social History:   Educational Hx: Unknown  Occupational Hx: Unknown  Legal Hx: Unknown  Living Situation: Lives alone, aides come to home  Access to weapons/lethal means: Unknown     Exam Findings  Physical Exam: Reviewed and agree with the physical exam findings conducted by the medical provider.  Vital Signs:  Temp:  [98.6 F (37 C)-99 F (37.2 C)] 99 F (37.2 C)  (09/26 0827) Pulse Rate:  [75-84] 78 (09/26 0827) Resp:  [16-18] 18 (09/26 0827) BP: (144-157)/(69-84) 145/84 (09/26 0827) SpO2:  [93 %-100 %] 100 % (09/26 0827) Blood pressure (!) 145/84, pulse 78, temperature 99 F (37.2 C), resp. rate 18, height 5' 7 (1.702 m), weight 95.3 kg, SpO2 100%. Body mass index is 32.89 kg/m.    Mental Status Exam: General Appearance: Casual  Orientation:  Full (Time, Place, and Person)  Memory:  Immediate;   Fair Recent;   Fair Remote;   Fair  Concentration:  Concentration: Fair and Attention Span: Fair  Recall:  Fair  Attention  Fair  Eye Contact:  Fair  Speech:  Clear and Coherent  Language:  Fair  Volume:  Normal  Mood: Content  Affect:  Congruent  Thought Process:  Coherent  Thought Content:  Visual hallucinations, possible auditory hallucinations  Suicidal Thoughts:  No  Homicidal Thoughts:  No  Judgement:  Fair  Insight:  Fair  Psychomotor Activity:  Normal  Akathisia:  No  Fund of Knowledge:  Fair      Assets:  Manufacturing systems engineer Social Support  Cognition:  WNL  ADL's:  Intact  AIMS (if indicated):        Other History   These have been pulled in through the EMR, reviewed, and updated if appropriate.  Family History:  The patient's family history is not on file.  Medical History: Past Medical History:  Diagnosis Date   Asthma    COPD (chronic obstructive pulmonary disease) (HCC)    Diabetes mellitus without complication (HCC)    Fibromyalgia    Hypertension    Stroke [facility]) 2023   2023, 2008 and others. 8 strokes. R hemiparesis    Surgical History: Past Surgical History:  Procedure Laterality Date   APPENDECTOMY     CHOLECYSTECTOMY       Medications:   Current Facility-Administered Medications:    acetaminophen  (TYLENOL ) tablet 650 mg, 650 mg, Oral, Q6H PRN, 650 mg at 08/04/24 1425 **OR** acetaminophen  (TYLENOL ) suppository 650 mg, 650 mg, Rectal, Q6H PRN, Alicia Delayne GAILS, MD   benztropine  (COGENTIN )  tablet 0.5 mg, 0.5 mg, Oral, BID PRN, Smith, Annie B, NP   buprenorphine  (SUBUTEX ) SL tablet 2 mg, 2 mg, Sublingual, TID, Rashid, Farhan, MD, 2 mg at 08/04/24 9072   cefTRIAXone  (ROCEPHIN ) 2 g in sodium chloride  0.9 % 100 mL IVPB, 2 g, Intravenous, Q24H, Park, Leonor BROCKS, [place], Last Rate: 200 mL/hr at 08/03/24 2113, 2 g at 08/03/24 2113   enoxaparin  (LOVENOX ) injection 47.5 mg, 0.5 mg/kg, Subcutaneous, Q24H, Alicia Delayne V, MD, 47.5 mg at 08/04/24 9074   escitalopram  (LEXAPRO ) tablet 20 mg, 20 mg, Oral, Daily, Alicia Delayne V, MD, 20 mg at 08/04/24 9072   insulin  aspart (novoLOG ) injection 0-15 Units, 0-15 Units, Subcutaneous, TID WC, Alicia Delayne GAILS, MD, 3 Units at  08/04/24 1245   insulin  aspart (novoLOG ) injection 0-5 Units, 0-5 Units, Subcutaneous, QHS, Alicia Delayne GAILS, MD, 2 Units at 08/03/24 2124   insulin  glargine (LANTUS ) injection 10 Units, 10 Units, Subcutaneous, Daily, Rashid, Farhan, MD, 10 Units at 08/04/24 1101   losartan  (COZAAR ) tablet 100 mg, 100 mg, Oral, Daily, Rashid, Farhan, MD, 100 mg at 08/04/24 1245   lurasidone  (LATUDA ) tablet 120 mg, 120 mg, Oral, QPM, Hunt, Madison H, RPH, 120 mg at 08/03/24 1630   nutrition supplement (JUVEN) (JUVEN) powder packet 1 packet, 1 packet, Oral, BID BM, Rashid, Farhan, MD, 1 packet at 08/04/24 1426   ondansetron  (ZOFRAN ) tablet 4 mg, 4 mg, Oral, Q6H PRN **OR** ondansetron  (ZOFRAN ) injection 4 mg, 4 mg, Intravenous, Q6H PRN, Alicia Delayne GAILS, MD   paliperidone  (INVEGA ) 24 hr tablet 6 mg, 6 mg, Oral, QPM, Smith, Annie B, NP   pantoprazole  (PROTONIX ) EC tablet 40 mg, 40 mg, Oral, Daily, Rashid, Farhan, MD, 40 mg at 08/04/24 9072   rosuvastatin  (CRESTOR ) tablet 5 mg, 5 mg, Oral, Daily, Rashid, Farhan, MD, 5 mg at 08/04/24 1245  Allergies: Allergies  Allergen Reactions   Celecoxib Other (See Comments) and Swelling    Other Reaction: OTHER REACTION = SWELLING   Gabapentin Rash and Swelling    Pt says she gets face and throat swelling.    Levetiracetam Rash   Oxycodone-Acetaminophen  Itching, Nausea Only and Swelling    Pt states that nausea was the most significant effect. Pt states that percocet is tolerable when given ondansetron  (ZOFRAN ).   Quetiapine Other (See Comments) and Rash    Increased blood sugars.   Shellfish Allergy Swelling    Scallops, shellfish => swelling   Soap Swelling    [place] soap   Acetaminophen  Nausea And Vomiting   Alprazolam Other (See Comments)    Memory loss   Atorvastatin     Other reaction(s): Muscle Pain   Diazepam Other (See Comments)    Memory loss   Dicyclomine Other (See Comments)    Over-sedation   Ibuprofen Nausea And Vomiting    Other Reaction: TONGUE SWELING/BLEEDING   Ivp Dye [Iodinated Contrast Media] Itching   Oxycodone Itching and Swelling    Swelling of tongue   Propoxyphene     With tylenol    Sertraline Other (See Comments)    Other reaction(s): Unknown unknown   Trazodone Other (See Comments)    Over-sedation   Acetaminophen -Codeine Itching and Nausea Only   Aspirin Nausea Only and Swelling    Sinus swelling    Codeine Itching and Nausea Only    No associated rash. Itching stops on its own after a few days, but faster when given diphenhydramine.   Tramadol Swelling    Facial swelling   Zolpidem Nausea Only and Other (See Comments)    Causes memory loss    Kita Neace LITTIE Lukes, PA-C

## 2024-08-04 NOTE — Progress Notes (Addendum)
 PROGRESS NOTE    Alicia Mayo  FMW:[id number] DOB: [birth year] DOA: 08/02/2024 PCP: Dante Elsie Celestia MADISON, MD   Brief Narrative:    58 y.o. female with medical history significant for Insulin -dependent type 2 diabetes, diabetic gastroparesis, chronic pain on chronic narcotics, HTN, anxiety and depression, asthma being admitted with acute metabolic encephalopathy and urinary tract infection.  She presented to the ED with confusion and weakness in the setting of recent diarrhea. Pysch consulted. No indications for inpt psych placement. She gets intermittent episodes of drowsiness. She does have aide at home.   Assessment & Plan:  Principal Problem:   Acute metabolic encephalopathy Active Problems:   Urinary tract infection   Hypotension   Uncontrolled type 2 diabetes mellitus with hyperglycemia, with long-term current use of insulin  (HCC)   AKI (acute kidney injury)   Electrolyte abnormality   Fibromyalgia   Bipolar II disorder (HCC)   History of stroke   Chronic pain   Altered mental status    Acute metabolic encephalopathy Possibly multifactorial and related to UTI, dehydration, medication Neurologic checks with fall and aspiration precautions Treat UTI Minimize sedating drugs Dced remeron    Acute uncomplicated Urinary tract infection Continue Rocephin  Follow cultures   Essential hypertension Restarted losartan  on 9/26   Electrolyte abnormality Hypokalemia and hypomagnesemia Likely related to reported recent diarrhea Being repleted Continue to monitor   AKI (acute kidney injury) Creatinine 1.15 above baseline is 0.76 Hydrate and monitor Avoid nephrotoxins   Uncontrolled type 2 diabetes mellitus with hyperglycemia, with long-term current use of insulin   Added lantus  10 units daily on 9/26 Diabetes coordinator on board Sliding scale insulin  coverage   Chronic pain Fibromyalgia Continue home meds pending med rec.  Hold sedating meds if able   Bipolar II  disorder  Recent hallucinations Continue with meds  Hyperlipidemia: continue with statin.    DVT prophylaxis:  Lovenox      Code Status: Full Code Family Communication:  None at the bedside Status is: Observation   Subjective:  She was too drowsy this morning and was unable to communicate effectively.  Examination:  General exam: Appears calm and comfortable  Respiratory system: Clear to auscultation. Respiratory effort normal. Cardiovascular system: S1 & S2 heard, RRR. No JVD, murmurs, rubs, gallops or clicks. No pedal edema. Gastrointestinal system: Abdomen is nondistended, soft and nontender. No organomegaly or masses felt. Normal bowel sounds heard. Central nervous system: Alert and oriented. No focal neurological deficits. Extremities: Symmetric 5 x 5 power. Skin: No rashes, lesions or ulcers Psychiatry: Judgement and insight appear normal. Mood & affect appropriate.       Diet Orders (From admission, onward)     Start     Ordered   08/03/24 0138  Diet heart healthy/carb modified Room service appropriate? Yes; Fluid consistency: Thin  Diet effective now       Question Answer Comment  Diet-HS Snack? Nothing   Room service appropriate? Yes   Fluid consistency: Thin      08/03/24 0138            Objective: Vitals:   08/03/24 0746 08/03/24 2043 08/04/24 0342 08/04/24 0827  BP: 135/77 (!) 157/81 (!) 144/69 (!) 145/84  Pulse: 81 84 75 78  Resp: 18 16 17 18   Temp: 97.7 F (36.5 C) 98.6 F (37 C)  99 F (37.2 C)  TempSrc:  Oral    SpO2: 97% 99% 93% 100%  Weight:      Height:       No intake or  output data in the 24 hours ending 08/04/24 0908 Filed Weights   08/02/24 1543  Weight: 95.3 kg    Scheduled Meds:  benztropine   1 mg Oral BID   buprenorphine   2 mg Sublingual TID   enoxaparin  (LOVENOX ) injection  0.5 mg/kg Subcutaneous Q24H   escitalopram   20 mg Oral Daily   insulin  aspart  0-15 Units Subcutaneous TID WC   insulin  aspart  0-5 Units  Subcutaneous QHS   insulin  glargine  10 Units Subcutaneous Daily   lurasidone   120 mg Oral QPM   mirtazapine   30 mg Oral QHS   nutrition supplement (JUVEN)  1 packet Oral BID BM   paliperidone   3 mg Oral QPM   pantoprazole   40 mg Oral Daily   pregabalin   150 mg Oral TID   Continuous Infusions:  cefTRIAXone  (ROCEPHIN )  IV 2 g (08/03/24 2113)    Nutritional status     Body mass index is 32.89 kg/m.  Data Reviewed:   CBC: Recent Labs  Lab 08/02/24 1545 08/03/24 0503  WBC 8.1 7.6  HGB 10.8* 10.4*  HCT 33.2* 31.6*  MCV 91.7 90.8  PLT 240 207   Basic Metabolic Panel: Recent Labs  Lab 08/02/24 1545 08/03/24 0503  NA 138 144  K 3.0* 2.9*  CL 98 102  CO2 29 26  GLUCOSE 214* 152*  BUN 9 8  CREATININE 1.15* 0.71  CALCIUM  9.5 8.9  MG 1.6* 1.7   GFR: Estimated Creatinine Clearance: 93.1 mL/min (by C-G formula based on SCr of 0.71 mg/dL). Liver Function Tests: Recent Labs  Lab 08/02/24 1545  AST 18  ALT 15  ALKPHOS 63  BILITOT 0.7  PROT 7.9  ALBUMIN 3.9   No results for input(s): LIPASE, AMYLASE in the last 168 hours. No results for input(s): AMMONIA in the last 168 hours. Coagulation Profile: No results for input(s): INR, PROTIME in the last 168 hours. Cardiac Enzymes: No results for input(s): CKTOTAL, CKMB, CKMBINDEX, TROPONINI in the last 168 hours. BNP (last 3 results) No results for input(s): PROBNP in the last 8760 hours. HbA1C: Recent Labs    08/03/24 0503  HGBA1C 10.3*   CBG: Recent Labs  Lab 08/03/24 0745 08/03/24 1146 08/03/24 1608 08/03/24 2101 08/04/24 0830  GLUCAP 149* 204* 186* 227* 137*   Lipid Profile: No results for input(s): CHOL, HDL, LDLCALC, TRIG, CHOLHDL, LDLDIRECT in the last 72 hours. Thyroid Function Tests: No results for input(s): TSH, T4TOTAL, FREET4, T3FREE, THYROIDAB in the last 72 hours. Anemia Panel: No results for input(s): VITAMINB12, FOLATE, FERRITIN, TIBC,  IRON, RETICCTPCT in the last 72 hours. Sepsis Labs: Recent Labs  Lab 08/02/24 0005 08/03/24 0503  LATICACIDVEN 1.3 0.8    Recent Results (from the past 240 hours)  Blood culture (routine x 2)     Status: None (Preliminary result)   Collection Time: 08/03/24 12:05 AM   Specimen: BLOOD RIGHT FOREARM  Result Value Ref Range Status   Specimen Description   Final    BLOOD RIGHT FOREARM Performed at [facility], [address] [place], [place] [address]    Special Requests   Final    BOTTLES DRAWN AEROBIC ONLY Blood Culture adequate volume Performed at [facility], [address] [place], [place] [address]    Culture  Setup Time   Final    GRAM POSITIVE COCCI AEROBIC BOTTLE ONLY CRITICAL RESULT CALLED TO, READ BACK BY AND VERIFIED WITH: NATHAN PARKS @1950  ON 08/03/24 SKL    Culture  Final    GRAM POSITIVE COCCI TOO YOUNG TO READ Performed at [facility], [address], [place], [place] [address]    Report Status PENDING  Incomplete  Blood Culture ID Panel (Reflexed)     Status: Abnormal   Collection Time: 08/03/24 12:05 AM  Result Value Ref Range Status   Enterococcus faecalis NOT DETECTED NOT DETECTED Final   Enterococcus Faecium NOT DETECTED NOT DETECTED Final   Listeria monocytogenes NOT DETECTED NOT DETECTED Final   Staphylococcus species DETECTED (A) NOT DETECTED Final    Comment: CRITICAL RESULT CALLED TO, READ BACK BY AND VERIFIED WITH: NATHAN PARKS @1950  ON 08/03/24 SKL    Staphylococcus aureus (BCID) NOT DETECTED NOT DETECTED Final   Staphylococcus epidermidis NOT DETECTED NOT DETECTED Final   Staphylococcus lugdunensis NOT DETECTED NOT DETECTED Final   Streptococcus species NOT DETECTED NOT DETECTED Final   Streptococcus agalactiae NOT DETECTED NOT DETECTED Final   Streptococcus pneumoniae NOT DETECTED NOT DETECTED Final   Streptococcus pyogenes NOT DETECTED NOT DETECTED Final   A.calcoaceticus-baumannii NOT DETECTED NOT DETECTED  Final   Bacteroides fragilis NOT DETECTED NOT DETECTED Final   Enterobacterales NOT DETECTED NOT DETECTED Final   Enterobacter cloacae complex NOT DETECTED NOT DETECTED Final   Escherichia coli NOT DETECTED NOT DETECTED Final   Klebsiella aerogenes NOT DETECTED NOT DETECTED Final   Klebsiella oxytoca NOT DETECTED NOT DETECTED Final   Klebsiella pneumoniae NOT DETECTED NOT DETECTED Final   Proteus species NOT DETECTED NOT DETECTED Final   Salmonella species NOT DETECTED NOT DETECTED Final   Serratia marcescens NOT DETECTED NOT DETECTED Final   Haemophilus influenzae NOT DETECTED NOT DETECTED Final   Neisseria meningitidis NOT DETECTED NOT DETECTED Final   Pseudomonas aeruginosa NOT DETECTED NOT DETECTED Final   Stenotrophomonas maltophilia NOT DETECTED NOT DETECTED Final   Candida albicans NOT DETECTED NOT DETECTED Final   Candida auris NOT DETECTED NOT DETECTED Final   Candida glabrata NOT DETECTED NOT DETECTED Final   Candida krusei NOT DETECTED NOT DETECTED Final   Candida parapsilosis NOT DETECTED NOT DETECTED Final   Candida tropicalis NOT DETECTED NOT DETECTED Final   Cryptococcus neoformans/gattii NOT DETECTED NOT DETECTED Final    Comment: Performed at [facility], [address] [place], [place] [address]  Blood culture (routine x 2)     Status: None (Preliminary result)   Collection Time: 08/03/24 12:19 AM   Specimen: BLOOD LEFT HAND  Result Value Ref Range Status   Specimen Description BLOOD LEFT HAND  Final   Special Requests   Final    BOTTLES DRAWN AEROBIC ONLY Blood Culture results may not be optimal due to an inadequate volume of blood received in culture bottles   Culture   Final    NO GROWTH 1 DAY Performed at [facility], [address] [place], [place] [address]    Report Status PENDING  Incomplete         Radiology Studies: CT ABDOMEN PELVIS WO CONTRAST Result Date: 08/02/2024 CLINICAL DATA:  Left lower quadrant pain. EXAM: CT  ABDOMEN AND PELVIS WITHOUT CONTRAST TECHNIQUE: Multidetector CT imaging of the abdomen and pelvis was performed following the standard protocol without IV contrast. RADIATION DOSE REDUCTION: This exam was performed according to the departmental dose-optimization program which includes automated exposure control, adjustment of the mA and/or kV according to patient size and/or use of iterative reconstruction technique. COMPARISON:  July 01, 2023 FINDINGS: Lower chest: No acute abnormality. Hepatobiliary: No focal liver abnormality  is seen. Status post cholecystectomy. No biliary dilatation. Pancreas: Unremarkable. No pancreatic ductal dilatation or surrounding inflammatory changes. Spleen: Normal in size without focal abnormality. Adrenals/Urinary Tract: Adrenal glands are unremarkable. Kidneys are normal, without renal calculi, focal lesion, or hydronephrosis. Bladder is unremarkable. Stomach/Bowel: Stomach is within normal limits. Appendix appears normal. No evidence of bowel wall thickening, distention, or inflammatory changes. Vascular/Lymphatic: Aortic atherosclerosis. No enlarged abdominal or pelvic lymph nodes. Reproductive: Status post hysterectomy. No adnexal masses. Other: No abdominal wall hernia or abnormality. No abdominopelvic ascites. Musculoskeletal: No acute or significant osseous findings. IMPRESSION: 1. No acute or active process within the abdomen or pelvis. 2. Evidence of prior cholecystectomy and hysterectomy. 3. Aortic atherosclerosis. Electronically Signed   By: Suzen Dials M.D.   On: 08/02/2024 20:01   CT Head Wo Contrast Result Date: 08/02/2024 CLINICAL DATA:  Mental status change, unknown cause. Generalized weakness. EXAM: CT HEAD WITHOUT CONTRAST TECHNIQUE: Contiguous axial images were obtained from the base of the skull through the vertex without intravenous contrast. RADIATION DOSE REDUCTION: This exam was performed according to the departmental dose-optimization program which  includes automated exposure control, adjustment of the mA and/or kV according to patient size and/or use of iterative reconstruction technique. COMPARISON:  Head CT 03/10/2024 FINDINGS: Brain: There is no evidence of an acute infarct, intracranial hemorrhage, mass, midline shift, or extra-axial fluid collection. Cerebral volume is normal. The ventricles are normal in size. Vascular: No hyperdense vessel. Skull: No fracture or suspicious lesion. Sinuses/Orbits: Minimal mucosal thickening in the included paranasal sinuses. Clear mastoid air cells. Unremarkable orbits. Other: None. IMPRESSION: Unremarkable CT appearance of the brain. Electronically Signed   By: Dasie Hamburg M.D.   On: 08/02/2024 19:50           LOS: 0 days   Time spent= 39 mins    Deliliah Room, MD [facility]  If 7PM-7AM, please contact night-coverage  08/04/2024, 9:08 AM

## 2024-08-04 NOTE — Evaluation (Signed)
 Physical Therapy Evaluation Patient Details Name: Devera Englander MRN: [id number] DOB: 1966-04-12 Today's Date: 08/04/2024  History of Present Illness  presented to ER secondary to progressive weakness; admitted for management of acute metabolic encephalopathy, UTI  Clinical Impression  Patient resting in bed upon arrival; sleeping, but easily awakens to voice and light touch this date.  Oriented to basic information, follows simple commands (with increased time); pleasant and cooperative throughout session.  Denies pain. Bilat UE/LE strength and ROM generally weak and deconditioned, but grossly functional for basic transfers and gait.  Scattered residual weakness from previous CVAs reported (L > R); no acute change. Currently requiring min assist for bed mobility; cga for sit/stand, standing balance, basic transfers and gait (100') with RW.  Demonstrates partially reciprocal stepping pattern with shuffling steps, generally flat foot contact; maintains bilat knee flexion, sligtly crouched posture throughout gait cycle.  Slow and guarded gait performance, limited balance reactions evident; however, no overt buckling or LOB.  Patient feels gait performance/ability is near baseline, but a little slower Would benefit from skilled PT to address above deficits and promote optimal return to PLOF.; recommend post-acute PT follow up as indicated by interdisciplinary care team.          If plan is discharge home, recommend the following: A little help with walking and/or transfers;A little help with bathing/dressing/bathroom   Can travel by private vehicle        Equipment Recommendations    Recommendations for Other Services       Functional Status Assessment Patient has had a recent decline in their functional status and demonstrates the ability to make significant improvements in function in a reasonable and predictable amount of time.     Precautions / Restrictions Precautions Precautions:  Fall Recall of Precautions/Restrictions: Impaired Restrictions Weight Bearing Restrictions Per Provider Order: No      Mobility  Bed Mobility Overal bed mobility: Needs Assistance Bed Mobility: Supine to Sit     Supine to sit: Min assist     General bed mobility comments: transition towards L side of bed; assist for truncal elevation due to functional weakness of L hemi-body    Transfers Overall transfer level: Needs assistance Equipment used: Rolling walker (2 wheels) Transfers: Sit to/from Stand Sit to Stand: Contact guard assist                Ambulation/Gait Ambulation/Gait assistance: Contact guard assist Gait Distance (Feet): 100 Feet Assistive device: Rolling walker (2 wheels)         General Gait Details: partially reciprocal stepping pattern with shuffling steps, generally flat foot contact; maintains bilat knee flexion, sligtly crouched posture throughout gait cycle.  Slow and guarded gait performance, limited balance reactions evident; however, no overt buckling or LOB.  Patient feels gait performance/ability is near baseline, but a little slower  Careers information officer     Tilt Bed    Modified Rankin (Stroke Patients Only)       Balance Overall balance assessment: Needs assistance Sitting-balance support: No upper extremity supported, Feet supported Sitting balance-Leahy Scale: Good     Standing balance support: Bilateral upper extremity supported, During functional activity Standing balance-Leahy Scale: Fair                               Pertinent Vitals/Pain Pain Assessment Pain Assessment: No/denies pain    Home Living Family/patient expects  to be discharged to:: Private residence Living Arrangements: Alone Available Help at Discharge: Personal care attendant;Available PRN/intermittently Type of Home: House Home Access: Stairs to enter Entrance Stairs-Rails: Right;Left Entrance Stairs-Number  of Steps: 3-4   Home Layout: One level Home Equipment: Agricultural consultant (2 wheels);Cane - single point;Shower seat;Electric scooter;Wheelchair - manual      Prior Function Prior Level of Function : Needs assist             Mobility Comments: Sup for household distances with SPC vs RW ADLs Comments: Pt has an aide that comes M-F for 8 hours (divided up during the day), Sat 5 hrs, and Sun 4 hours to assist with IADLs and ADL tasks. Pt endorses ambulation with cane, RW, or electric scooter depending on how she feels each day.     Extremity/Trunk Assessment   Upper Extremity Assessment Upper Extremity Assessment: Generalized weakness    Lower Extremity Assessment Lower Extremity Assessment: Generalized weakness (grossly 3+ to 4-/5)       Communication   Communication Communication: No apparent difficulties    Cognition Arousal: Alert Behavior During Therapy: WFL for tasks assessed/performed   PT - Cognitive impairments: No family/caregiver present to determine baseline                       PT - Cognition Comments: Oriented to self, location and general situation; follows commands; pleasant and cooperative   Following commands impaired: Follows one step commands with increased time     Cueing Cueing Techniques: Verbal cues, Gestural cues, Tactile cues, Visual cues     General Comments      Exercises     Assessment/Plan    PT Assessment Patient needs continued PT services  PT Problem List Decreased strength;Decreased activity tolerance;Decreased balance;Decreased mobility;Decreased coordination;Decreased cognition;Decreased knowledge of use of DME;Decreased safety awareness;Decreased knowledge of precautions       PT Treatment Interventions DME instruction;Gait training;Stair training;Functional mobility training;Therapeutic exercise;Therapeutic activities;Balance training;Patient/family education;Cognitive remediation    PT Goals (Current goals can be  found in the Care Plan section)  Acute Rehab PT Goals Patient Stated Goal: to walk a little bit PT Goal Formulation: With patient Time For Goal Achievement: 08/18/24 Potential to Achieve Goals: Good    Frequency Min 2X/week     Co-evaluation               AM-PAC PT 6 Clicks Mobility  Outcome Measure Help needed turning from your back to your side while in a flat bed without using bedrails?: None Help needed moving from lying on your back to sitting on the side of a flat bed without using bedrails?: A Little Help needed moving to and from a bed to a chair (including a wheelchair)?: A Little Help needed standing up from a chair using your arms (e.g., wheelchair or bedside chair)?: A Little Help needed to walk in hospital room?: A Little Help needed climbing 3-5 steps with a railing? : A Little 6 Click Score: 19    End of Session   Activity Tolerance: Patient tolerated treatment well Patient left: in chair;with call bell/phone within reach;with chair alarm set Nurse Communication: Mobility status PT Visit Diagnosis: Muscle weakness (generalized) (M62.81);Difficulty in walking, not elsewhere classified (R26.2)    Time: 8495-8479 PT Time Calculation (min) (ACUTE ONLY): 16 min   Charges:   PT Evaluation $PT Eval Moderate Complexity: 1 Mod   PT General Charges $$ ACUTE PT VISIT: 1 Visit  Maciah Feeback H. Delores, PT, DPT, NCS 08/04/24, 9:56 PM [phone]

## 2024-08-04 NOTE — Progress Notes (Addendum)
 Lab called a correction on the urine culture....it's not gram negative rods, it's actually gram positive cocci.   MD aware. No new orders.   Alicia Mayo V Alicia Mayo

## 2024-08-04 NOTE — Progress Notes (Signed)
 Occupational Therapy Treatment Patient Details Name: Alicia Mayo MRN: [id number] DOB: 01/24/1966 Today's Date: 08/04/2024   History of present illness 58 y.o. female with medical history significant for Insulin -dependent type 2 diabetes, diabetic gastroparesis, chronic pain on chronic narcotics, HTN, anxiety and depression, asthma being admitted with acute metabolic encephalopathy and urinary tract infection.  She presented to the ED with confusion and weakness in the setting of recent diarrhea.  At baseline she is able to care for herself.  She reportedly had been having hallucinations and received a new med a couple days prior by her psychiatrist.   OT comments  Pt is supine in bed on arrival. Easily arousable and agreeable to OT session. She did not complain of pain. Pt performed bed mobility with supervision and increased time/effort as she had been asleep. Pt required Max A to donn bil socks, Min A to donn pull-up over feet and CGA in standing over hips. Demo STS and ADL transfers ~12 ft with RW use and CGA for safety during session while performing standing grooming tasks at the sink. Lunch arrived and pt returned to recliner where she was set up for lunch.  Pt left with all needs in place and will cont to require skilled acute OT services to maximize her safety and IND to return to PLOF.        If plan is discharge home, recommend the following:  A little help with walking and/or transfers;A little help with bathing/dressing/bathroom;Help with stairs or ramp for entrance;Assistance with cooking/housework;Assist for transportation;Supervision due to cognitive status   Equipment Recommendations  Other (comment) (defer)    Recommendations for Other Services      Precautions / Restrictions Precautions Precautions: Fall Recall of Precautions/Restrictions: Impaired Restrictions Weight Bearing Restrictions Per Provider Order: No       Mobility Bed Mobility Overal bed mobility: Needs  Assistance Bed Mobility: Supine to Sit     Supine to sit: Supervision, HOB elevated, Used rails     General bed mobility comments: increased time/effort to reach EOB, but no physical assist required    Transfers Overall transfer level: Needs assistance Equipment used: Rolling walker (2 wheels) Transfers: Sit to/from Stand Sit to Stand: Contact guard assist           General transfer comment: able to stand from EOB at lowest height with CGA for safety and ambulate ~12 ft within the room using RW     Balance Overall balance assessment: Needs assistance Sitting-balance support: Feet supported Sitting balance-Leahy Scale: Good     Standing balance support: During functional activity, Single extremity supported, No upper extremity supported Standing balance-Leahy Scale: Fair Standing balance comment: no LOB while performing LB dressing and standing grooming tasks at sink                           ADL either performed or assessed with clinical judgement   ADL Overall ADL's : Needs assistance/impaired     Grooming: Wash/dry face;Standing;Contact guard assist               Lower Body Dressing: Minimal assistance;Sitting/lateral leans;Sit to/from stand Lower Body Dressing Details (indicate cue type and reason): donn pull up             Functional mobility during ADLs: Contact guard assist General ADL Comments: ADL transfer ~12 ft using RW with CGA to perform standing grooming tasks and LB dressing seated EOB    Extremity/Trunk Assessment  Vision       Perception     Praxis     Communication Communication Communication: No apparent difficulties   Cognition Arousal: Alert Behavior During Therapy: Flat affect               OT - Cognition Comments: slow processing                 Following commands: Impaired Following commands impaired: Follows one step commands with increased time      Cueing   Cueing  Techniques: Verbal cues, Gestural cues, Tactile cues, Visual cues  Exercises      Shoulder Instructions       General Comments      Pertinent Vitals/ Pain       Pain Assessment Pain Assessment: No/denies pain  Home Living                                          Prior Functioning/Environment              Frequency  Min 2X/week        Progress Toward Goals  OT Goals(current goals can now be found in the care plan section)  Progress towards OT goals: Progressing toward goals  Acute Rehab OT Goals Patient Stated Goal: improve strength OT Goal Formulation: With patient Time For Goal Achievement: 08/17/24 Potential to Achieve Goals: Fair  Plan      Co-evaluation                 AM-PAC OT 6 Clicks Daily Activity     Outcome Measure   Help from another person eating meals?: None Help from another person taking care of personal grooming?: None Help from another person toileting, which includes using toliet, bedpan, or urinal?: A Little Help from another person bathing (including washing, rinsing, drying)?: A Little Help from another person to put on and taking off regular upper body clothing?: None Help from another person to put on and taking off regular lower body clothing?: A Little 6 Click Score: 21    End of Session Equipment Utilized During Treatment: Rolling walker (2 wheels)  OT Visit Diagnosis: Unsteadiness on feet (R26.81);Muscle weakness (generalized) (M62.81)   Activity Tolerance Patient tolerated treatment well   Patient Left with call bell/phone within reach;in chair;with chair alarm set;with family/visitor present   Nurse Communication Mobility status        Time: 8841-8776 OT Time Calculation (min): 25 min  Charges: OT General Charges $OT Visit: 1 Visit OT Treatments $Self Care/Home Management : 23-37 mins  Maiah Sinning, OTR/L  08/04/24, 1:50 PM   Mazin Emma E Delmos Velaquez 08/04/2024, 1:48 PM

## 2024-08-04 NOTE — Inpatient Diabetes Management (Signed)
 Inpatient Diabetes Program Recommendations  AACE/ADA: New Consensus Statement on Inpatient Glycemic Control   Target Ranges:  Prepandial:   less than 140 mg/dL      Peak postprandial:   less than 180 mg/dL (1-2 hours)      Critically ill patients:  140 - 180 mg/dL    Latest Reference Range & Units 08/03/24 01:49 08/03/24 07:45 08/03/24 11:46 08/03/24 16:08 08/03/24 21:01 08/04/24 08:30 08/04/24 11:48  Glucose-Capillary 70 - 99 mg/dL 848 (H) 850 (H) 795 (H) 186 (H) 227 (H) 137 (H) 197 (H)   Review of Glycemic Control  Diabetes history: DM2 Outpatient Diabetes medications: Lantus  52 units BID, Novolog  18-28 units TID with meals, Metformin XR 1000 mg BID, Jardiance 10 mg daily (meds per PCP note on 07/04/24) Current orders for Inpatient glycemic control: Lantus  10  units daily,Novolog  0-15 units TID with meals, Novolog  0-5 units QHS   Inpatient Diabetes Program Recommendations:       HbgA1C: A1C 10.3% on 08/03/24 indicating an average glucose of 249 mg/dl. Prior A1C 12% on 06/13/24 (per Care Everywhere).   Outpatient DM: Patient is not sure if she is still taking Jardiance because medications are in pill packs. Advised patient to call her pharmacy that provides pill packs to ask what Jardiance pill looks like so it can be taken out or have them pick up pill packs and provide new ones that don't have the Jardiance.   NOTE: Spoke with patient at bedside about diabetes and home regimen for diabetes control. Patient sitting up in chair and just finished eating lunch.  Patient reports that she has been working with the pharmacist at her PCP office with DM medication adjustments.  Patient reports that she takes Lantus  38-52 units BID (take 38 units if CBG under 300 mg/dl or 52 units if CBG over 300 mg/dl), Humalog 18-28 units TID with meals, and Metformin XR 1000 mg BID. Inquied about Jardiance and patient reports that her Endocrinologist had stopped it due to UTI but her PCP told her to continue taking  it. Patient is not sure if Jardiance pills are sill in her current pill packs she gets from her pharmacy or not. Since patient has recurrent UTI, encouraged patient to call her pharmacy to ask if Bernadine is in the pill packs she has, if so what does it look like so it can be removed or ask pharmacy about picking up pill back she has at home and have them remove the Jardiance and provide new pill packs.  Patient reports that she just started using the Dexcom G7 CGM sensor and her phone is at home so she is not able to get current sensor readings. Encouraged patient to get her sensor and phone connected once she gets home so it will start reading her glucose. Encouraged patient to let the pharmacist at PCP office know that she was in the hospital and did not have her phone so she will have days of no data. Patient reports that she has plenty of DM medications and supplies at home.  Patient verbalized understanding of information discussed and reports no further questions at this time related to diabetes.  Thanks, Earnie Gainer, RN, MSN, CDE Diabetes Coordinator Inpatient Diabetes Program [phone] (Team Pager)

## 2024-08-05 DIAGNOSIS — G9341 Metabolic encephalopathy: Secondary | ICD-10-CM | POA: Diagnosis not present

## 2024-08-05 LAB — URINE CULTURE: Culture: >=100000 — AB

## 2024-08-05 LAB — GLUCOSE, CAPILLARY
Glucose-Capillary: 155 mg/dL — ABNORMAL HIGH (ref 70–99)
Glucose-Capillary: 241 mg/dL — ABNORMAL HIGH (ref 70–99)
Glucose-Capillary: 175 mg/dL — ABNORMAL HIGH (ref 70–99)
Glucose-Capillary: 203 mg/dL — ABNORMAL HIGH (ref 70–99)

## 2024-08-05 MED ORDER — MELATONIN 5 MG PO TABS
5.0000 mg | ORAL_TABLET | Freq: Every evening | ORAL | Status: DC | PRN
  Administered 2024-08-05 – 2024-08-06 (×2): 5 mg via ORAL
  Filled 2024-08-05 (×2): qty 1

## 2024-08-05 MED ORDER — KETOROLAC TROMETHAMINE 15 MG/ML IJ SOLN
15.0000 mg | Freq: Once | INTRAMUSCULAR | Status: AC
  Administered 2024-08-05: 15 mg via INTRAVENOUS
  Filled 2024-08-05: qty 1

## 2024-08-05 NOTE — Consult Note (Incomplete)
 [facility] Psychiatric Consult Initial  Patient Name: .Alicia Mayo  MRN: [id number]  DOB: [birth year]  Consult Order details:  Orders (From admission, onward)     Start     Ordered   08/02/24 2017  CONSULT TO CALL ACT TEAM       Ordering Provider: Suzanne Kirsch, MD  Provider:  (Not yet assigned)  Question:  Reason for Consult?  Answer:  Psych consult   08/02/24 2016   08/02/24 2017  IP CONSULT TO PSYCHIATRY       Ordering Provider: Suzanne Kirsch, MD  Provider:  (Not yet assigned)  Question:  Reason for consult:  Answer:  Medication management   08/02/24 2016             Mode of Visit: In person    Psychiatry Consult Evaluation  Service Date: August 05, 2024 LOS:  LOS: 1 day  Chief Complaint Altered Mental Status  Primary Psychiatric Diagnoses  *** 2.  *** 3.  ***  Assessment  Alicia Mayo is a 58 y.o. female admitted: {CHL BH Medical or Presented to ZI:68182}qnm 08/02/2024  5:58 PM for ***. She carries the psychiatric diagnoses of *** and has a past medical history of  ***.   Her current presentation of *** is most consistent with ***. She meets criteria for *** based on ***.  Current outpatient psychotropic medications include *** and historically she has had a *** response to these medications. She was *** compliant with medications prior to admission as evidenced by ***. On initial examination, patient ***. Please see plan below for detailed recommendations.   Diagnoses:  Active Hospital problems: Principal Problem:   Acute metabolic encephalopathy Active Problems:   Fibromyalgia   Bipolar II disorder (HCC)   History of stroke   Hypotension   Urinary tract infection   Uncontrolled type 2 diabetes mellitus with hyperglycemia, with long-term current use of insulin  (HCC)   AKI (acute kidney injury)   Chronic pain   Electrolyte abnormality   Altered mental status    Plan   ## Psychiatric Medication Recommendations:  ***  ## Medical Decision Making  Capacity: {CHL BH MEDICAL DECISION MAKING CAPACITY:31818}  ## Further Work-up:  -- *** {CHLmacgeneralandspecificworkuprecs:31821} -- most recent EKG on *** had QtC of *** -- Pertinent labwork reviewed earlier this admission includes: ***   ## Disposition:-- {CHLmaccldispo:31820}  ## Behavioral / Environmental: -{CHLmacbehavioralenvironmental2:31847}    ## Safety and Observation Level:  - Based on my clinical evaluation, I estimate the patient to be at *** risk of self harm in the current setting. - At this time, we recommend  {CHL BH SUICIDE OBSERVATION LEVEL:31850}. This decision is based on my review of the chart including patient's history and current presentation, interview of the patient, mental status examination, and consideration of suicide risk including evaluating suicidal ideation, plan, intent, suicidal or self-harm behaviors, risk factors, and protective factors. This judgment is based on our ability to directly address suicide risk, implement suicide prevention strategies, and develop a safety plan while the patient is in the clinical setting. Please contact our team if there is a concern that risk level has changed.  CSSR Risk Category:C-SSRS RISK CATEGORY: No Risk  Suicide Risk Assessment: Patient has following modifiable risk factors for suicide: {CHLmacmodifiablesuicideriskfactors:31822}, which we are addressing by ***. Patient has following non-modifiable or demographic risk factors for suicide: {CHLmacnonmodifiablesuicideriskfactors:31823} Patient has the following protective factors against suicide: {CHLmacprotectivefactors:31824}  Thank you for this consult request. Recommendations have been communicated to the primary team.  We will *** at this time.   Alicia JINNY Mountain, NP       History of Present Illness  Relevant Aspects of [facility] or ED course:31819} Course:  Admitted on 08/02/2024 for ***. They ***.   Patient Report:  ***  Psych ROS:   Depression: *** Anxiety:  *** Mania (lifetime and current): *** Psychosis: (lifetime and current): ***  Collateral information:  Contacted *** at *** on ***  ROS   Psychiatric and Social History  Psychiatric History:  Information collected from ***  Prev Dx/Sx: *** Current Psych Provider: *** Home Meds (current): *** Previous Med Trials: *** Therapy: ***  Prior Psych Hospitalization: ***  Prior Self Harm: *** Prior Violence: ***  Family Psych History: *** Family Hx suicide: ***  Social History:  Developmental Hx: *** Educational Hx: *** Occupational Hx: *** Legal Hx: *** Living Situation: *** Spiritual Hx: *** Access to weapons/lethal means: ***   Substance History Alcohol: ***  Type of alcohol *** Last Drink *** Number of drinks per day *** History of alcohol withdrawal seizures *** History of DT's *** Tobacco: *** Illicit drugs: *** Prescription drug abuse: *** Rehab hx: ***  Exam Findings  Physical Exam: *** Vital Signs:  Temp:  [98.2 F (36.8 C)-98.6 F (37 C)] 98.6 F (37 C) (09/27 2055) Pulse Rate:  [71-87] 87 (09/27 2055) Resp:  [16-17] 17 (09/27 2055) BP: (153-171)/(75-99) 153/75 (09/27 2055) SpO2:  [93 %-100 %] 93 % (09/27 2055) Blood pressure (!) 153/75, pulse 87, temperature 98.6 F (37 C), resp. rate 17, height 5' 7 (1.702 m), weight 95.3 kg, SpO2 93%. Body mass index is 32.89 kg/m.  Physical Exam  Mental Status Exam: General Appearance: {Appearance:22683}  Orientation:  {BHH ORIENTATION (PAA):22689}  Memory:  {BHH MEMORY:22881}  Concentration:  {Concentration:21399}  Recall:  {BHH GOOD/FAIR/POOR:22877}  Attention  {BH Attention Span:31825}  Eye Contact:  {BHH EYE CONTACT:22684}  Speech:  {Speech:22685}  Language:  {BHH GOOD/FAIR/POOR:22877}  Volume:  {Volume (PAA):22686}  Mood: ***  Affect:  {Affect (PAA):22687}  Thought Process:  {Thought Process (PAA):22688}  Thought Content:  {Thought Content:22690}  Suicidal Thoughts:   {ST/HT (PAA):22692}  Homicidal Thoughts:  {ST/HT (PAA):22692}  Judgement:  {Judgement (PAA):22694}  Insight:  {Insight (PAA):22695}  Psychomotor Activity:  {Psychomotor (PAA):22696}  Akathisia:  {BHH YES OR NO:22294}  Fund of Knowledge:  {BHH GOOD/FAIR/POOR:22877}      Assets:  {Assets (PAA):22698}  Cognition:  {chl bhh cognition:304700322}  ADL's:  {BHH JIO'D:77709}  AIMS (if indicated):        Other History   These have been pulled in through the EMR, reviewed, and updated if appropriate.  Family History:  The patient's family history is not on file.  Medical History: Past Medical History:  Diagnosis Date   Asthma    COPD (chronic obstructive pulmonary disease) (HCC)    Diabetes mellitus without complication (HCC)    Fibromyalgia    Hypertension    Stroke [facility]) 2023   2023, 2008 and others. 8 strokes. R hemiparesis    Surgical History: Past Surgical History:  Procedure Laterality Date   APPENDECTOMY     CHOLECYSTECTOMY       Medications:   Current Facility-Administered Medications:    acetaminophen  (TYLENOL ) tablet 650 mg, 650 mg, Oral, Q6H PRN, 650 mg at 08/05/24 0531 **OR** acetaminophen  (TYLENOL ) suppository 650 mg, 650 mg, Rectal, Q6H PRN, Cleatus Delayne GAILS, MD   benztropine  (COGENTIN ) tablet 0.5 mg, 0.5 mg, Oral, BID PRN, Smith, Annie B, NP  buprenorphine  (SUBUTEX ) SL tablet 2 mg, 2 mg, Sublingual, TID, Rashid, Farhan, MD, 2 mg at 08/05/24 2130   cefTRIAXone  (ROCEPHIN ) 2 g in sodium chloride  0.9 % 100 mL IVPB, 2 g, Intravenous, Q24H, Park, Leonor BROCKS, [place], Last Rate: 200 mL/hr at 08/05/24 2134, 2 g at 08/05/24 2134   enoxaparin  (LOVENOX ) injection 47.5 mg, 0.5 mg/kg, Subcutaneous, Q24H, Cleatus Hoof V, MD, 47.5 mg at 08/05/24 0935   escitalopram  (LEXAPRO ) tablet 20 mg, 20 mg, Oral, Daily, Cleatus Hoof V, MD, 20 mg at 08/05/24 0935   insulin  aspart (novoLOG ) injection 0-15 Units, 0-15 Units, Subcutaneous, TID WC, Cleatus Hoof GAILS, MD, 3 Units at 08/05/24  1808   insulin  aspart (novoLOG ) injection 0-5 Units, 0-5 Units, Subcutaneous, QHS, Cleatus Hoof GAILS, MD, 2 Units at 08/05/24 2130   insulin  glargine (LANTUS ) injection 10 Units, 10 Units, Subcutaneous, Daily, Rashid, Farhan, MD, 10 Units at 08/05/24 0935   losartan  (COZAAR ) tablet 100 mg, 100 mg, Oral, Daily, Rashid, Farhan, MD, 100 mg at 08/05/24 0935   lurasidone  (LATUDA ) tablet 120 mg, 120 mg, Oral, QPM, Hunt, Madison H, RPH, 120 mg at 08/05/24 1809   melatonin tablet 5 mg, 5 mg, Oral, QHS PRN, Cleatus Hoof GAILS, MD   nutrition supplement (JUVEN) (JUVEN) powder packet 1 packet, 1 packet, Oral, BID BM, Rashid, Farhan, MD, 1 packet at 08/05/24 1307   ondansetron  (ZOFRAN ) tablet 4 mg, 4 mg, Oral, Q6H PRN **OR** ondansetron  (ZOFRAN ) injection 4 mg, 4 mg, Intravenous, Q6H PRN, Cleatus Hoof V, MD, 4 mg at 08/05/24 1524   paliperidone  (INVEGA ) 24 hr tablet 6 mg, 6 mg, Oral, QPM, Smith, Annie B, NP, 6 mg at 08/05/24 1809   pantoprazole  (PROTONIX ) EC tablet 40 mg, 40 mg, Oral, Daily, Rashid, Farhan, MD, 40 mg at 08/05/24 0935   rosuvastatin  (CRESTOR ) tablet 5 mg, 5 mg, Oral, Daily, Rashid, Farhan, MD, 5 mg at 08/05/24 0935  Allergies: Allergies  Allergen Reactions   Celecoxib Other (See Comments) and Swelling    Other Reaction: OTHER REACTION = SWELLING   Gabapentin Rash and Swelling    Pt says she gets face and throat swelling.   Levetiracetam Rash   Oxycodone-Acetaminophen  Itching, Nausea Only and Swelling    Pt states that nausea was the most significant effect. Pt states that percocet is tolerable when given ondansetron  (ZOFRAN ).   Quetiapine Other (See Comments) and Rash    Increased blood sugars.   Shellfish Allergy Swelling    Scallops, shellfish => swelling   Soap Swelling    [place] spring soap   Acetaminophen  Nausea And Vomiting   Alprazolam Other (See Comments)    Memory loss   Atorvastatin     Other reaction(s): Muscle Pain   Diazepam Other (See Comments)    Memory loss    Dicyclomine Other (See Comments)    Over-sedation   Ibuprofen Nausea And Vomiting    Other Reaction: TONGUE SWELING/BLEEDING   Ivp Dye [Iodinated Contrast Media] Itching   Oxycodone Itching and Swelling    Swelling of tongue   Propoxyphene     With tylenol    Sertraline Other (See Comments)    Other reaction(s): Unknown unknown   Trazodone Other (See Comments)    Over-sedation   Acetaminophen -Codeine Itching and Nausea Only   Aspirin Nausea Only and Swelling    Sinus swelling    Codeine Itching and Nausea Only    No associated rash. Itching stops on its own after a few days, but faster when given diphenhydramine.  Tramadol Swelling    Facial swelling   Zolpidem Nausea Only and Other (See Comments)    Causes memory loss    Alicia JINNY Mountain, NP

## 2024-08-05 NOTE — Progress Notes (Signed)
 PROGRESS NOTE    Alicia Mayo  FMW:[id number] DOB: 1966/06/04 DOA: 08/02/2024 PCP: Dante Elsie Celestia MADISON, MD   Brief Narrative:    58 y.o. female with medical history significant for Insulin -dependent type 2 diabetes, diabetic gastroparesis, chronic pain on chronic narcotics, HTN, anxiety and depression, asthma being admitted with acute metabolic encephalopathy and urinary tract infection.  She presented to the ED with confusion and weakness in the setting of recent diarrhea. Pysch consulted and medication adjustments are being done. She gets intermittent episodes of drowsiness. She does have aide at home.   Assessment & Plan:  Principal Problem:   Acute metabolic encephalopathy Active Problems:   Urinary tract infection   Hypotension   Uncontrolled type 2 diabetes mellitus with hyperglycemia, with long-term current use of insulin  (HCC)   AKI (acute kidney injury)   Electrolyte abnormality   Fibromyalgia   Bipolar II disorder (HCC)   History of stroke   Chronic pain   Altered mental status    Acute metabolic encephalopathy,POA:  Possibly multifactorial and related to UTI, dehydration, medication Neurologic checks with fall and aspiration precautions Treat UTI Minimize sedating drugs Dced remeron  and pregabalin .   Acute uncomplicated Urinary tract infection Continue Rocephin  Follow cultures-Staph Epidermidis   Essential hypertension Restarted losartan  on 9/26   Electrolyte abnormality Hypokalemia and hypomagnesemia Likely related to reported recent diarrhea Being repleted Continue to monitor   AKI (acute kidney injury) Resolved now Hydrated Avoid nephrotoxins   Uncontrolled type 2 diabetes mellitus with hyperglycemia, with long-term current use of insulin   Added lantus  10 units daily on 9/26 Diabetes coordinator on board Sliding scale insulin  coverage   Chronic pain Fibromyalgia  Hold sedating meds   Bipolar II disorder  Recent hallucinations Continue  with meds Increased Invega  to 6 mg on 9/26 by psych team. May need to reduce Latuda  dose Cogentin  0.5 mg BID prn for tremors/EPS  Hyperlipidemia: continue with statin.    DVT prophylaxis:  Lovenox      Code Status: Full Code Family Communication:  None at the bedside Status is: Inpt   Subjective:  She was drowsy this morning. I did wake her up and she was able to open her eyes. She was mumbling incomprehensibly.  Examination:  General exam: Appears drowsy and confused Respiratory system: Clear to auscultation. Respiratory effort normal. Cardiovascular system: S1 & S2 heard, RRR. No JVD, murmurs, rubs, gallops or clicks. No pedal edema. Gastrointestinal system: Abdomen is nondistended, soft and nontender. No organomegaly or masses felt. Normal bowel sounds heard. Central nervous system: Confused and disoriented. No focal neurological deficits. Extremities: Symmetric 5 x 5 power. Skin: No rashes, lesions or ulcers       Diet Orders (From admission, onward)     Start     Ordered   08/03/24 0138  Diet heart healthy/carb modified Room service appropriate? Yes; Fluid consistency: Thin  Diet effective now       Question Answer Comment  Diet-HS Snack? Nothing   Room service appropriate? Yes   Fluid consistency: Thin      08/03/24 0138            Objective: Vitals:   08/04/24 0342 08/04/24 0827 08/04/24 2011 08/05/24 0846  BP: (!) 144/69 (!) 145/84 127/80 (!) 171/99  Pulse: 75 78 82 84  Resp: 17 18 17 16   Temp:  99 F (37.2 C) 97.9 F (36.6 C) 98.2 F (36.8 C)  TempSrc:    Oral  SpO2: 93% 100% 99% 98%  Weight:  Height:        Intake/Output Summary (Last 24 hours) at 08/05/2024 0907 Last data filed at 08/05/2024 0700 Gross per 24 hour  Intake 200 ml  Output --  Net 200 ml   Filed Weights   08/02/24 1543  Weight: 95.3 kg    Scheduled Meds:  buprenorphine   2 mg Sublingual TID   enoxaparin  (LOVENOX ) injection  0.5 mg/kg Subcutaneous Q24H    escitalopram   20 mg Oral Daily   insulin  aspart  0-15 Units Subcutaneous TID WC   insulin  aspart  0-5 Units Subcutaneous QHS   insulin  glargine  10 Units Subcutaneous Daily   losartan   100 mg Oral Daily   lurasidone   120 mg Oral QPM   nutrition supplement (JUVEN)  1 packet Oral BID BM   paliperidone   6 mg Oral QPM   pantoprazole   40 mg Oral Daily   rosuvastatin   5 mg Oral Daily   Continuous Infusions:  cefTRIAXone  (ROCEPHIN )  IV 2 g (08/04/24 2134)    Nutritional status     Body mass index is 32.89 kg/m.  Data Reviewed:   CBC: Recent Labs  Lab 08/02/24 1545 08/03/24 0503  WBC 8.1 7.6  HGB 10.8* 10.4*  HCT 33.2* 31.6*  MCV 91.7 90.8  PLT 240 207   Basic Metabolic Panel: Recent Labs  Lab 08/02/24 1545 08/03/24 0503 08/04/24 1258  NA 138 144 141  K 3.0* 2.9* 3.1*  CL 98 102 100  CO2 29 26 28   GLUCOSE 214* 152* 219*  BUN 9 8 8   CREATININE 1.15* 0.71 0.77  CALCIUM  9.5 8.9 9.3  MG 1.6* 1.7  --    GFR: Estimated Creatinine Clearance: 93.1 mL/min (by C-G formula based on SCr of 0.77 mg/dL). Liver Function Tests: Recent Labs  Lab 08/02/24 1545  AST 18  ALT 15  ALKPHOS 63  BILITOT 0.7  PROT 7.9  ALBUMIN 3.9   No results for input(s): LIPASE, AMYLASE in the last 168 hours. No results for input(s): AMMONIA in the last 168 hours. Coagulation Profile: No results for input(s): INR, PROTIME in the last 168 hours. Cardiac Enzymes: No results for input(s): CKTOTAL, CKMB, CKMBINDEX, TROPONINI in the last 168 hours. BNP (last 3 results) No results for input(s): PROBNP in the last 8760 hours. HbA1C: Recent Labs    08/03/24 0503  HGBA1C 10.3*   CBG: Recent Labs  Lab 08/04/24 0830 08/04/24 1148 08/04/24 1806 08/04/24 2302 08/05/24 0845  GLUCAP 137* 197* 141* 223* 155*   Lipid Profile: No results for input(s): CHOL, HDL, LDLCALC, TRIG, CHOLHDL, LDLDIRECT in the last 72 hours. Thyroid Function Tests: No results for  input(s): TSH, T4TOTAL, FREET4, T3FREE, THYROIDAB in the last 72 hours. Anemia Panel: No results for input(s): VITAMINB12, FOLATE, FERRITIN, TIBC, IRON, RETICCTPCT in the last 72 hours. Sepsis Labs: Recent Labs  Lab 08/02/24 0005 08/03/24 0503  LATICACIDVEN 1.3 0.8    Recent Results (from the past 240 hours)  Urine Culture (for pregnant, neutropenic or urologic patients or patients with an indwelling urinary catheter)     Status: Abnormal   Collection Time: 08/02/24  9:17 PM   Specimen: Urine, Clean Catch  Result Value Ref Range Status   Specimen Description   Final    URINE, CLEAN CATCH Performed at [facility], [address] [place], [place] [address]    Special Requests   Final    NONE Performed at [facility], [address], [place], [place] [address]    Culture (  A)  Final    >=100,000 COLONIES/mL STAPHYLOCOCCUS EPIDERMIDIS CORRECTED RESULTS PREVIOUSLY REPORTED AS: GRAM NEGATIVE RODS CORRECTED RESULTS CALLED TO: EUSTACE JEST RN, AT 1250 08/04/24 D. VANHOOK Performed at [facility], [address], [place], [place] [address]    Report Status 08/05/2024 FINAL  Final   Organism ID, Bacteria STAPHYLOCOCCUS EPIDERMIDIS (A)  Final      Susceptibility   Staphylococcus epidermidis - MIC*    CIPROFLOXACIN <=0.5 SENSITIVE Sensitive     GENTAMICIN <=0.5 SENSITIVE Sensitive     NITROFURANTOIN <=16 SENSITIVE Sensitive     OXACILLIN <=0.25 SENSITIVE Sensitive     TETRACYCLINE 2 SENSITIVE Sensitive     VANCOMYCIN 1 SENSITIVE Sensitive     TRIMETH/SULFA <=10 SENSITIVE Sensitive     RIFAMPIN <=0.5 SENSITIVE Sensitive     Inducible Clindamycin NEGATIVE Sensitive     * >=100,000 COLONIES/mL STAPHYLOCOCCUS EPIDERMIDIS  Blood culture (routine x 2)     Status: None (Preliminary result)   Collection Time: 08/03/24 12:05 AM   Specimen: BLOOD RIGHT FOREARM  Result Value Ref Range Status   Specimen Description   Final    BLOOD RIGHT  FOREARM Performed at [facility], [address] [place], [place] [address]    Special Requests   Final    BOTTLES DRAWN AEROBIC ONLY Blood Culture adequate volume Performed at [facility], [address] [place], [place] [address]    Culture  Setup Time   Final    GRAM POSITIVE COCCI AEROBIC BOTTLE ONLY CRITICAL RESULT CALLED TO, READ BACK BY AND VERIFIED WITH: NATHAN PARKS @1950  ON 08/03/24 SKL    Culture   Final    GRAM POSITIVE COCCI IDENTIFICATION TO FOLLOW Performed at [facility], [address], [place], [place] [address]    Report Status PENDING  Incomplete  Blood Culture ID Panel (Reflexed)     Status: Abnormal   Collection Time: 08/03/24 12:05 AM  Result Value Ref Range Status   Enterococcus faecalis NOT DETECTED NOT DETECTED Final   Enterococcus Faecium NOT DETECTED NOT DETECTED Final   Listeria monocytogenes NOT DETECTED NOT DETECTED Final   Staphylococcus species DETECTED (A) NOT DETECTED Final    Comment: CRITICAL RESULT CALLED TO, READ BACK BY AND VERIFIED WITH: NATHAN PARKS @1950  ON 08/03/24 SKL    Staphylococcus aureus (BCID) NOT DETECTED NOT DETECTED Final   Staphylococcus epidermidis NOT DETECTED NOT DETECTED Final   Staphylococcus lugdunensis NOT DETECTED NOT DETECTED Final   Streptococcus species NOT DETECTED NOT DETECTED Final   Streptococcus agalactiae NOT DETECTED NOT DETECTED Final   Streptococcus pneumoniae NOT DETECTED NOT DETECTED Final   Streptococcus pyogenes NOT DETECTED NOT DETECTED Final   A.calcoaceticus-baumannii NOT DETECTED NOT DETECTED Final   Bacteroides fragilis NOT DETECTED NOT DETECTED Final   Enterobacterales NOT DETECTED NOT DETECTED Final   Enterobacter cloacae complex NOT DETECTED NOT DETECTED Final   Escherichia coli NOT DETECTED NOT DETECTED Final   Klebsiella aerogenes NOT DETECTED NOT DETECTED Final   Klebsiella oxytoca NOT DETECTED NOT DETECTED Final   Klebsiella pneumoniae NOT DETECTED NOT DETECTED  Final   Proteus species NOT DETECTED NOT DETECTED Final   Salmonella species NOT DETECTED NOT DETECTED Final   Serratia marcescens NOT DETECTED NOT DETECTED Final   Haemophilus influenzae NOT DETECTED NOT DETECTED Final   Neisseria meningitidis NOT DETECTED NOT DETECTED Final   Pseudomonas aeruginosa NOT DETECTED NOT DETECTED Final   Stenotrophomonas maltophilia NOT DETECTED NOT DETECTED Final  Candida albicans NOT DETECTED NOT DETECTED Final   Candida auris NOT DETECTED NOT DETECTED Final   Candida glabrata NOT DETECTED NOT DETECTED Final   Candida krusei NOT DETECTED NOT DETECTED Final   Candida parapsilosis NOT DETECTED NOT DETECTED Final   Candida tropicalis NOT DETECTED NOT DETECTED Final   Cryptococcus neoformans/gattii NOT DETECTED NOT DETECTED Final    Comment: Performed at [facility], [address] [place], [place] [address]  Blood culture (routine x 2)     Status: None (Preliminary result)   Collection Time: 08/03/24 12:19 AM   Specimen: BLOOD LEFT HAND  Result Value Ref Range Status   Specimen Description BLOOD LEFT HAND  Final   Special Requests   Final    BOTTLES DRAWN AEROBIC ONLY Blood Culture results may not be optimal due to an inadequate volume of blood received in culture bottles   Culture   Final    NO GROWTH 2 DAYS Performed at [facility], [address], [place], [place] [address]    Report Status PENDING  Incomplete         Radiology Studies: No results found.          LOS: 1 day   Time spent= 39 mins    Deliliah Room, MD [facility]  If 7PM-7AM, please contact night-coverage  08/05/2024, 9:07 AM

## 2024-08-05 NOTE — Progress Notes (Signed)
 Mobility Specialist Progress Note:    08/05/24 0933  Mobility  Activity Ambulated with assistance  Level of Assistance Contact guard assist, steadying assist  Assistive Device Front wheel walker  Distance Ambulated (ft) 100 ft  Range of Motion/Exercises Active;All extremities  Activity Response Tolerated well  Mobility visit 1 Mobility  Mobility Specialist Start Time (ACUTE ONLY) 0912  Mobility Specialist Stop Time (ACUTE ONLY) 0931  Mobility Specialist Time Calculation (min) (ACUTE ONLY) 19 min   Pt received in chair, agreeable to mobility. Required CGA to stand and ambulate with RW. Tolerated well, asx throughout. Returned to chair, belongings in reach. All needs met.  Sherrilee Ditty Mobility Specialist Please contact via Special educational needs teacher or  Rehab office at [phone]

## 2024-08-05 NOTE — Progress Notes (Signed)
 Mobility Specialist Progress Note:    08/05/24 1330  Mobility  Activity Ambulated with assistance;Pivoted/transferred from chair to bed  Level of Assistance Contact guard assist, steadying assist  Assistive Device Front wheel walker  Distance Ambulated (ft) 200 ft  Range of Motion/Exercises Active;All extremities  Activity Response Tolerated well  Mobility visit 1 Mobility  Mobility Specialist Start Time (ACUTE ONLY) 1311  Mobility Specialist Stop Time (ACUTE ONLY) 1328  Mobility Specialist Time Calculation (min) (ACUTE ONLY) 17 min   Pt received in chair, after ambulation requested to be back in bed. Asx throughout mobility, left pt supine. Alarm on and belongings in reach, all needs met.  Sherrilee Ditty Mobility Specialist Please contact via Special educational needs teacher or  Rehab office at [phone]

## 2024-08-05 NOTE — TOC Progression Note (Addendum)
 Transition of Care [facility]) - Progression Note    Patient Details  Name: Alicia Mayo MRN: [id number] Date of Birth: 1966/04/15  Transition of [facility] CM/SW Contact  Victory Jackquline RAMAN, RN Phone Number: 08/05/2024, 2:29 PM  Clinical Narrative:   Chart reviewed. RNCM, spoke with the patient. I introduced myself, my role, and explained that discharge planning recommendations would be discussed. PT recommended Home with Home Health/PT. Patient is agreeable to HH/PT and would like to use [facility]. I spoke to [place]  [phone]. She took the patient's information and sate that she will give the information to the office and they will follow up with us  on Monday. Patient made aware. RNCM will continue to follow for discharge planning/care coordination and update as applicable.   [name] physical home address: [address] [place], [place] [address].                    Expected Discharge Plan and Services                                               Social Drivers of Health (SDOH) Interventions SDOH Screenings   Food Insecurity: Food Insecurity Present (08/31/2023)   Received from [facility]  Housing: Low Risk  (03/31/2024)   Received from [facility]  Transportation Needs: No Transportation Needs (08/31/2023)   Received from [facility]  Utilities: Not At Risk (08/31/2023)   Received from [facility]  Financial Resource Strain: Low Risk  (08/31/2023)   Received from [facility]  Physical Activity: Inactive (11/03/2022)   Received from [facility]  Social Connections: Socially Isolated (11/03/2022)   Received from [facility]  Stress: No Stress Concern Present (11/03/2022)   Received from [facility]  Tobacco Use: High Risk (08/02/2024)    Readmission Risk Interventions     No data to display

## 2024-08-06 DIAGNOSIS — G9341 Metabolic encephalopathy: Secondary | ICD-10-CM | POA: Diagnosis not present

## 2024-08-06 LAB — GLUCOSE, CAPILLARY
Glucose-Capillary: 152 mg/dL — ABNORMAL HIGH (ref 70–99)
Glucose-Capillary: 178 mg/dL — ABNORMAL HIGH (ref 70–99)
Glucose-Capillary: 192 mg/dL — ABNORMAL HIGH (ref 70–99)
Glucose-Capillary: 141 mg/dL — ABNORMAL HIGH (ref 70–99)
Glucose-Capillary: 156 mg/dL — ABNORMAL HIGH (ref 70–99)
Glucose-Capillary: 178 mg/dL — ABNORMAL HIGH (ref 70–99)

## 2024-08-06 LAB — CULTURE, BLOOD (ROUTINE X 2): Special Requests: ADEQUATE

## 2024-08-06 NOTE — Progress Notes (Signed)
 PROGRESS NOTE    Alicia Mayo  FMW:[id number] DOB: 10/08/1966 DOA: 08/02/2024 PCP: Dante Elsie Celestia MADISON, MD   Brief Narrative:    58 y.o. female with medical history significant for Insulin -dependent type 2 diabetes, diabetic gastroparesis, chronic pain on chronic narcotics, HTN, anxiety and depression, asthma being admitted with acute metabolic encephalopathy and urinary tract infection.  She presented to the ED with confusion and weakness in the setting of recent diarrhea. Pysch consulted and medication adjustments are being done. She gets intermittent episodes of drowsiness. She does have aide at home. She will need HHPT on discharge.  Assessment & Plan:  Principal Problem:   Acute metabolic encephalopathy Active Problems:   Urinary tract infection   Hypotension   Uncontrolled type 2 diabetes mellitus with hyperglycemia, with long-term current use of insulin  (HCC)   AKI (acute kidney injury)   Electrolyte abnormality   Fibromyalgia   Bipolar II disorder (HCC)   History of stroke   Chronic pain   Altered mental status    Acute metabolic encephalopathy,POA: Improving now. Possibly multifactorial and related to UTI, dehydration, medication Neurologic checks with fall and aspiration precautions Treat UTI Minimize sedating drugs Dced remeron  and pregabalin .   Acute uncomplicated Urinary tract infection Continue Rocephin  Follow cultures-Staph Epidermidis   Essential hypertension Restarted losartan  on 9/26   Electrolyte abnormality Hypokalemia and hypomagnesemia Likely related to reported recent diarrhea Being repleted Continue to monitor   AKI (acute kidney injury) Resolved now Hydrated Avoid nephrotoxins   Uncontrolled type 2 diabetes mellitus with hyperglycemia, with long-term current use of insulin   Added lantus  10 units daily on 9/26 Diabetes coordinator on board Sliding scale insulin  coverage   Chronic pain Fibromyalgia  Hold sedating meds   Bipolar  II disorder  Recent hallucinations Continue with meds Increased Invega  to 6 mg on 9/26 by psych team. May need to reduce Latuda  dose Cogentin  0.5 mg BID prn for tremors/EPS  Hyperlipidemia: continue with statin.    DVT prophylaxis:  Lovenox      Code Status: Full Code Family Communication:  None at the bedside Status is: Inpt   Subjective:  She was much more awake and alert this morning. She is about to have breakfast. She did tell me that she walked yesterday with PT. She feels tired overall.  Examination:  General exam: Appears alert and awake Respiratory system: Clear to auscultation. Respiratory effort normal. Cardiovascular system: S1 & S2 heard, RRR. No JVD, murmurs, rubs, gallops or clicks. No pedal edema. Gastrointestinal system: Abdomen is nondistended, soft and nontender. No organomegaly or masses felt. Normal bowel sounds heard. Central nervous system: Awake and alert. No focal neurological deficits. Extremities: Symmetric 5 x 5 power. Skin: No rashes, lesions or ulcers       Diet Orders (From admission, onward)     Start     Ordered   08/03/24 0138  Diet heart healthy/carb modified Room service appropriate? Yes; Fluid consistency: Thin  Diet effective now       Question Answer Comment  Diet-HS Snack? Nothing   Room service appropriate? Yes   Fluid consistency: Thin      08/03/24 0138            Objective: Vitals:   08/05/24 2055 08/06/24 0309 08/06/24 0417 08/06/24 0823  BP: (!) 153/75 (!) 146/80 121/79 (!) 168/98  Pulse: 87 78 78 89  Resp: 17 18 18 20   Temp: 98.6 F (37 C) 98.7 F (37.1 C) 98.3 F (36.8 C) 98 F (36.7 C)  TempSrc:    Oral  SpO2: 93% 98% 96% 100%  Weight:      Height:        Intake/Output Summary (Last 24 hours) at 08/06/2024 0852 Last data filed at 08/05/2024 2300 Gross per 24 hour  Intake 480 ml  Output --  Net 480 ml   Filed Weights   08/02/24 1543  Weight: 95.3 kg    Scheduled Meds:  buprenorphine   2 mg  Sublingual TID   enoxaparin  (LOVENOX ) injection  0.5 mg/kg Subcutaneous Q24H   escitalopram   20 mg Oral Daily   insulin  aspart  0-15 Units Subcutaneous TID WC   insulin  aspart  0-5 Units Subcutaneous QHS   insulin  glargine  10 Units Subcutaneous Daily   losartan   100 mg Oral Daily   lurasidone   120 mg Oral QPM   nutrition supplement (JUVEN)  1 packet Oral BID BM   paliperidone   6 mg Oral QPM   pantoprazole   40 mg Oral Daily   rosuvastatin   5 mg Oral Daily   Continuous Infusions:  cefTRIAXone  (ROCEPHIN )  IV 2 g (08/05/24 2134)    Nutritional status     Body mass index is 32.89 kg/m.  Data Reviewed:   CBC: Recent Labs  Lab 08/02/24 1545 08/03/24 0503  WBC 8.1 7.6  HGB 10.8* 10.4*  HCT 33.2* 31.6*  MCV 91.7 90.8  PLT 240 207   Basic Metabolic Panel: Recent Labs  Lab 08/02/24 1545 08/03/24 0503 08/04/24 1258  NA 138 144 141  K 3.0* 2.9* 3.1*  CL 98 102 100  CO2 29 26 28   GLUCOSE 214* 152* 219*  BUN 9 8 8   CREATININE 1.15* 0.71 0.77  CALCIUM  9.5 8.9 9.3  MG 1.6* 1.7  --    GFR: Estimated Creatinine Clearance: 93.1 mL/min (by C-G formula based on SCr of 0.77 mg/dL). Liver Function Tests: Recent Labs  Lab 08/02/24 1545  AST 18  ALT 15  ALKPHOS 63  BILITOT 0.7  PROT 7.9  ALBUMIN 3.9   No results for input(s): LIPASE, AMYLASE in the last 168 hours. No results for input(s): AMMONIA in the last 168 hours. Coagulation Profile: No results for input(s): INR, PROTIME in the last 168 hours. Cardiac Enzymes: No results for input(s): CKTOTAL, CKMB, CKMBINDEX, TROPONINI in the last 168 hours. BNP (last 3 results) No results for input(s): PROBNP in the last 8760 hours. HbA1C: No results for input(s): HGBA1C in the last 72 hours.  CBG: Recent Labs  Lab 08/05/24 1139 08/05/24 1759 08/05/24 2057 08/05/24 2123 08/06/24 0820  GLUCAP 241* 175* 192* 203* 141*   Lipid Profile: No results for input(s): CHOL, HDL, LDLCALC, TRIG,  CHOLHDL, LDLDIRECT in the last 72 hours. Thyroid Function Tests: No results for input(s): TSH, T4TOTAL, FREET4, T3FREE, THYROIDAB in the last 72 hours. Anemia Panel: No results for input(s): VITAMINB12, FOLATE, FERRITIN, TIBC, IRON, RETICCTPCT in the last 72 hours. Sepsis Labs: Recent Labs  Lab 08/02/24 0005 08/03/24 0503  LATICACIDVEN 1.3 0.8    Recent Results (from the past 240 hours)  Urine Culture (for pregnant, neutropenic or urologic patients or patients with an indwelling urinary catheter)     Status: Abnormal   Collection Time: 08/02/24  9:17 PM   Specimen: Urine, Clean Catch  Result Value Ref Range Status   Specimen Description   Final    URINE, CLEAN CATCH Performed at [facility], [address], [place], [place] [address]    Special Requests   Final  NONE Performed at [facility], [address] [place], [place] [address]    Culture (A)  Final    >=100,000 COLONIES/mL STAPHYLOCOCCUS EPIDERMIDIS CORRECTED RESULTS PREVIOUSLY REPORTED AS: GRAM NEGATIVE RODS CORRECTED RESULTS CALLED TO: EUSTACE JEST RN, AT 1250 08/04/24 D. VANHOOK Performed at [facility], [address] [place], [place] [address]    Report Status 08/05/2024 FINAL  Final   Organism ID, Bacteria STAPHYLOCOCCUS EPIDERMIDIS (A)  Final      Susceptibility   Staphylococcus epidermidis - MIC*    CIPROFLOXACIN <=0.5 SENSITIVE Sensitive     GENTAMICIN <=0.5 SENSITIVE Sensitive     NITROFURANTOIN <=16 SENSITIVE Sensitive     OXACILLIN <=0.25 SENSITIVE Sensitive     TETRACYCLINE 2 SENSITIVE Sensitive     VANCOMYCIN 1 SENSITIVE Sensitive     TRIMETH/SULFA <=10 SENSITIVE Sensitive     RIFAMPIN <=0.5 SENSITIVE Sensitive     Inducible Clindamycin NEGATIVE Sensitive     * >=100,000 COLONIES/mL STAPHYLOCOCCUS EPIDERMIDIS  Blood culture (routine x 2)     Status: Abnormal   Collection Time: 08/03/24 12:05 AM   Specimen: BLOOD RIGHT FOREARM  Result Value  Ref Range Status   Specimen Description   Final    BLOOD RIGHT FOREARM Performed at [facility], [address], [place], [place] [address]    Special Requests   Final    BOTTLES DRAWN AEROBIC ONLY Blood Culture adequate volume Performed at [facility], [address] [place], [place] [address]    Culture  Setup Time   Final    GRAM POSITIVE COCCI AEROBIC BOTTLE ONLY CRITICAL RESULT CALLED TO, READ BACK BY AND VERIFIED WITH: NATHAN PARKS @1950  ON 08/03/24 SKL    Culture (A)  Final    STAPHYLOCOCCUS HOMINIS THE SIGNIFICANCE OF ISOLATING THIS ORGANISM FROM A SINGLE SET OF BLOOD CULTURES WHEN MULTIPLE SETS ARE DRAWN IS UNCERTAIN. PLEASE NOTIFY THE MICROBIOLOGY DEPARTMENT WITHIN ONE WEEK IF SPECIATION AND SENSITIVITIES ARE REQUIRED. Performed at [facility], [address], [place], [place] [address]    Report Status 08/06/2024 FINAL  Final  Blood Culture ID Panel (Reflexed)     Status: Abnormal   Collection Time: 08/03/24 12:05 AM  Result Value Ref Range Status   Enterococcus faecalis NOT DETECTED NOT DETECTED Final   Enterococcus Faecium NOT DETECTED NOT DETECTED Final   Listeria monocytogenes NOT DETECTED NOT DETECTED Final   Staphylococcus species DETECTED (A) NOT DETECTED Final    Comment: CRITICAL RESULT CALLED TO, READ BACK BY AND VERIFIED WITH: NATHAN PARKS @1950  ON 08/03/24 SKL    Staphylococcus aureus (BCID) NOT DETECTED NOT DETECTED Final   Staphylococcus epidermidis NOT DETECTED NOT DETECTED Final   Staphylococcus lugdunensis NOT DETECTED NOT DETECTED Final   Streptococcus species NOT DETECTED NOT DETECTED Final   Streptococcus agalactiae NOT DETECTED NOT DETECTED Final   Streptococcus pneumoniae NOT DETECTED NOT DETECTED Final   Streptococcus pyogenes NOT DETECTED NOT DETECTED Final   A.calcoaceticus-baumannii NOT DETECTED NOT DETECTED Final   Bacteroides fragilis NOT DETECTED NOT DETECTED Final   Enterobacterales NOT DETECTED NOT DETECTED Final    Enterobacter cloacae complex NOT DETECTED NOT DETECTED Final   Escherichia coli NOT DETECTED NOT DETECTED Final   Klebsiella aerogenes NOT DETECTED NOT DETECTED Final   Klebsiella oxytoca NOT DETECTED NOT DETECTED Final   Klebsiella pneumoniae NOT DETECTED NOT DETECTED Final   Proteus species NOT DETECTED NOT DETECTED Final   Salmonella species NOT DETECTED NOT DETECTED Final  Serratia marcescens NOT DETECTED NOT DETECTED Final   Haemophilus influenzae NOT DETECTED NOT DETECTED Final   Neisseria meningitidis NOT DETECTED NOT DETECTED Final   Pseudomonas aeruginosa NOT DETECTED NOT DETECTED Final   Stenotrophomonas maltophilia NOT DETECTED NOT DETECTED Final   Candida albicans NOT DETECTED NOT DETECTED Final   Candida auris NOT DETECTED NOT DETECTED Final   Candida glabrata NOT DETECTED NOT DETECTED Final   Candida krusei NOT DETECTED NOT DETECTED Final   Candida parapsilosis NOT DETECTED NOT DETECTED Final   Candida tropicalis NOT DETECTED NOT DETECTED Final   Cryptococcus neoformans/gattii NOT DETECTED NOT DETECTED Final    Comment: Performed at [facility], [address] [place], [place] [address]  Blood culture (routine x 2)     Status: None (Preliminary result)   Collection Time: 08/03/24 12:19 AM   Specimen: BLOOD LEFT HAND  Result Value Ref Range Status   Specimen Description BLOOD LEFT HAND  Final   Special Requests   Final    BOTTLES DRAWN AEROBIC ONLY Blood Culture results may not be optimal due to an inadequate volume of blood received in culture bottles   Culture   Final    NO GROWTH 3 DAYS Performed at [facility], [address], [place], [place] [address]    Report Status PENDING  Incomplete         Radiology Studies: No results found.          LOS: 2 days   Time spent= 39 mins    Deliliah Room, MD [facility]  If 7PM-7AM, please contact night-coverage  08/06/2024, 8:52 AM

## 2024-08-06 NOTE — Progress Notes (Signed)
 Mobility Specialist Progress Note:    08/06/24 0841  Mobility  Activity Stood at bedside;Pivoted/transferred from bed to chair  Level of Assistance Contact guard assist, steadying assist  Assistive Device  (HHA)  Distance Ambulated (ft) 4 ft  Range of Motion/Exercises Active;All extremities  Activity Response Tolerated well  Mobility visit 1 Mobility  Mobility Specialist Start Time (ACUTE ONLY) [phone]  Mobility Specialist Stop Time (ACUTE ONLY) 0841  Mobility Specialist Time Calculation (min) (ACUTE ONLY) 8 min     Alicia Mayo Mobility Specialist Please contact via Special educational needs teacher or  Rehab office at [phone]

## 2024-08-07 DIAGNOSIS — G9341 Metabolic encephalopathy: Secondary | ICD-10-CM | POA: Diagnosis not present

## 2024-08-07 LAB — GLUCOSE, CAPILLARY
Glucose-Capillary: 121 mg/dL — ABNORMAL HIGH (ref 70–99)
Glucose-Capillary: 155 mg/dL — ABNORMAL HIGH (ref 70–99)

## 2024-08-07 MED ORDER — BENZTROPINE MESYLATE 1 MG PO TABS: 0.5000 mg | ORAL_TABLET | Freq: Two times a day (BID) | ORAL | Status: DC | PRN

## 2024-08-07 MED ORDER — PALIPERIDONE ER 3 MG PO TB24: 6.0000 mg | ORAL_TABLET | Freq: Every day | ORAL | 0 refills | Status: DC

## 2024-08-07 MED ORDER — LINACLOTIDE 145 MCG PO CAPS
290.0000 ug | ORAL_CAPSULE | Freq: Every day | ORAL | Status: DC
  Administered 2024-08-07: 290 ug via ORAL
  Filled 2024-08-07: qty 2

## 2024-08-07 MED ORDER — PREGABALIN 150 MG PO CAPS: 150.0000 mg | ORAL_CAPSULE | Freq: Two times a day (BID) | ORAL | Status: DC

## 2024-08-07 MED ORDER — POLYETHYLENE GLYCOL 3350 17 G PO PACK
17.0000 g | PACK | Freq: Every day | ORAL | Status: DC
  Administered 2024-08-07: 17 g via ORAL
  Filled 2024-08-07: qty 1

## 2024-08-07 MED ORDER — LINACLOTIDE 290 MCG PO CAPS
290.0000 ug | ORAL_CAPSULE | Freq: Every day | ORAL | Status: DC
  Filled 2024-08-07: qty 1

## 2024-08-07 NOTE — Consult Note (Signed)
 [facility] Psychiatric Consult Follow-up  Patient Name: .Alicia Mayo  MRN: [id number]  DOB: Jan 24, 1966  Consult Order details:  Orders (From admission, onward)     Start     Ordered   08/02/24 2017  CONSULT TO CALL ACT TEAM       Ordering Provider: Suzanne Kirsch, MD  Provider:  (Not yet assigned)  Question:  Reason for Consult?  Answer:  Psych consult   08/02/24 2016   08/02/24 2017  IP CONSULT TO PSYCHIATRY       Ordering Provider: Suzanne Kirsch, MD  Provider:  (Not yet assigned)  Question:  Reason for consult:  Answer:  Medication management   08/02/24 2016             Mode of Visit: In person    Psychiatry Consult Evaluation  Service Date: August 07, 2024 LOS:  LOS: 3 days  Chief Complaint Hallucinations  Primary Psychiatric Diagnoses  R/o delirium secondary to UTI Bipolar I disorder    Assessment  Alicia Mayo is a 58 y.o. female admitted: Medically Alicia Mayo is a 58 y.o. female with medical history significant for Insulin -dependent type 2 diabetes, diabetic gastroparesis, chronic pain on chronic narcotics, HTN, anxiety and depression, asthma being admitted with acute metabolic encephalopathy and urinary tract infection.  She presented to the ED with confusion and weakness in the setting of recent diarrhea.  At baseline she is able to care for herself.  She reportedly had been having hallucinations and received a new med a couple days prior by her psychiatrist.  Patient was evaluated by psychiatry while in the ED medical workup was requested. On arrival to the ED she had a low-grade temp of 99.4 soft blood pressure of 95/64 which improved with fluids to 169/99. Labs notable for normal WBC of 8.1 baseline hemoglobin of 10.8.  Blood glucose 214, potassium of 3, creatinine of 1.15 above baseline of 0.76 a couple months prior.  Magnesium  slightly low at 1.6.  Urinalysis suggesting UTI. Per ED note, patient's caregiver [name] reported that patient was having worsening  hallucinations and confusion. He reported that she has a history of hallucinations and just was seen by outpatient psychiatry, and was given a new injection on Monday for her hallucinations and was told by the psychiatrist to come to the emergency department and be seen again if her hallucinations worsened. He states as of a couple weeks ago, patient was able to take care of herself.    On follow-up assessment today, patient's mental status has improved with treatment of UTI. Patient reports visual hallucinations of seeing cats in her room as she falls asleep. She reports possible auditory hallucinations of hearing laughter and hearing noises in her bathroom, though these noises are plausible in current setting. She denies hearing voices. She denies SI/HI/plan. Patient does not meet criteria for IVC. Psychotropic medications adjusted as below. We will continue to follow up with the patient at this time.   08/07/2024: Today, on assessment, patient reported improvement in hallucinations. She did report some continued symptoms that occur primarily when dosing off to sleep, but reported this has been chronic for her and outpatient is current managing. Patient also has caretaker that comes to her house and helps take patient to all appointments. Patient appeared tired, but was alert and oriented x4 and could accurately recall events that led up to patient being admitted to the hospital. We discussed continuing the current regimen of latuda  and Invega  at their current doses and deferring to outpatient  for further titration of medications- patient was agreeable with plan. Patient denied suicidal or homicidal ideations. She also denied command hallucinations. On current presentation there was no evidence of psychosis or mania and patient did not appear to be responding to internal stimuli. At this time, patient does not appear to be a risk to self or others. Psychiatrically cleared to follow up with outpatient providers  when medically discharged from hospital.  Diagnoses:  Active Hospital problems: Principal Problem:   Acute metabolic encephalopathy Active Problems:   Fibromyalgia   Bipolar II disorder (HCC)   History of stroke   Hypotension   Urinary tract infection   Uncontrolled type 2 diabetes mellitus with hyperglycemia, with long-term current use of insulin  (HCC)   AKI (acute kidney injury)   Chronic pain   Electrolyte abnormality   Altered mental status    Plan   ## Psychiatric Medication Recommendations:  Increased Invega  to 6 mg daily - recommend to continue at home Cogentin  modified to 0.5 mg twice daily as needed for tremors/EPS to remove anticholinergic delirium  Continue Latuda  at 120mg  daily dose- will defer to outpatient provider to titrate medication down as tolerated -discontinue home risperidone to minimize multiple antipsychotic use  ## Medical Decision Making Capacity: Not specifically addressed in this encounter  ## Further Work-up:   -- most recent EKG on 08/02/2024 had QtC of 504 -- Pertinent labwork reviewed earlier this admission includes: CMP, lactic acid, CBC, HbA1c, HIV screen, UA, blood culture, urine culture   ## Disposition:-- Patient is psychiatrically cleared to follow up with outpatient providers  ## Behavioral / Environmental: -Delirium Precautions: Delirium Interventions for Nursing and Staff: - RN to open blinds every AM. - To Bedside: Glasses, hearing aide, and pt's own shoes. Make available to patients. when possible and encourage use. - Encourage po fluids when appropriate, keep fluids within reach. - OOB to chair with meals. - Passive ROM exercises to all extremities with AM & PM care. - RN to assess orientation to person, time and place QAM and PRN. - Recommend extended visitation hours with familiar family/friends as feasible. - Staff to minimize disturbances at night. Turn off television when pt asleep or when not in use.    ## Safety and Observation  Level:  - Based on my clinical evaluation, I estimate the patient to be at low risk of self harm in the current setting. - At this time, we recommend  routine. This decision is based on my review of the chart including patient's history and current presentation, interview of the patient, mental status examination, and consideration of suicide risk including evaluating suicidal ideation, plan, intent, suicidal or self-harm behaviors, risk factors, and protective factors. This judgment is based on our ability to directly address suicide risk, implement suicide prevention strategies, and develop a safety plan while the patient is in the clinical setting. Please contact our team if there is a concern that risk level has changed.  CSSR Risk Category:C-SSRS RISK CATEGORY: No Risk  Suicide Risk Assessment: Patient has following modifiable risk factors for suicide: pain, medical illness (ie new dx of cancer), which we are addressing by inpatient hospitalization. Patient has following non-modifiable or demographic risk factors for suicide: none identified. Patient has the following protective factors against suicide: Access to outpatient mental health care  Thank you for this consult request. Recommendations have been communicated to the primary team.  We will sign off at this time.   Zelda Sharps, NP  History of Present Illness  Relevant Aspects of [facility]   Patient Report:  Patient reports she is feeling better, mental status has improved. Patient reports visual hallucinations of seeing cats in her room as she falls asleep. She reports possible auditory hallucinations of hearing laughter and hearing noises in her bathroom. She denies hearing voices. She denies SI/HI/plan.  She reported that she lives alone but an aide comes 8 hours daily from Monday through Friday, 5 hours on Saturday and then 4 hours on Sunday. She has help with transportation to attend medical appointments. She goes  to [facility] in [place] for outpatient psychiatric care. She reported receiving an injection there on Monday 9/22 to treat hallucinations- per chart review appears to be Invega  injection.   Psych ROS:  Depression: history of depressive symptoms  Anxiety:  Unknown  Mania (lifetime and current): unknown Psychosis: (lifetime and current): hallucinations   Psychiatric and Social History  Psychiatric History:  Information collected from the patient and chart review.   Prev Dx/Sx: Bipolar disorder  Current Psych Provider: [facility]  Home Meds (current): Buspar, Lexapro , Invega , Latuda , Remeron , Risperdal Previous Med Trials: Unknown  Therapy: [facility]   Prior Psych Hospitalization: Unknown   Prior Self Harm: Unknown  Prior Violence: Unknown   Family Psych History: Unknown  Family Hx suicide: Unknown   Social History:   Educational Hx: Unknown  Occupational Hx: Unknown  Legal Hx: Unknown  Living Situation: Lives alone, aides come to home  Access to weapons/lethal means: Unknown     Exam Findings  Physical Exam: Reviewed and agree with the physical exam findings conducted by the medical provider.  Vital Signs:  Temp:  [98 F (36.7 C)-98.4 F (36.9 C)] 98.3 F (36.8 C) (09/29 0947) Pulse Rate:  [78-89] 89 (09/29 0947) Resp:  [16] 16 (09/29 0947) BP: (142-180)/(80-166) 180/166 (09/29 0947) SpO2:  [100 %] 100 % (09/29 0947) Blood pressure (!) 180/166, pulse 89, temperature 98.3 F (36.8 C), resp. rate 16, height 5' 7 (1.702 m), weight 95.3 kg, SpO2 100%. Body mass index is 32.89 kg/m.    Mental Status Exam: General Appearance: Casual  Orientation:  Full (Time, Place, and Person)  Memory:  Immediate;   Fair Recent;   Fair Remote;   Fair  Concentration:  Concentration: Fair and Attention Span: Fair  Recall:  Fair  Attention  Fair  Eye Contact:  Fair  Speech:  Clear and Coherent  Language:  Fair  Volume:  Normal  Mood: Content  Affect:   Congruent  Thought Process:  Coherent  Thought Content:  Visual hallucinations, possible auditory hallucinations- these appear chronic in nature for patient  Suicidal Thoughts:  No  Homicidal Thoughts:  No  Judgement:  Fair  Insight:  Fair  Psychomotor Activity:  Normal  Akathisia:  No  Fund of Knowledge:  Fair      Assets:  Manufacturing systems engineer Social Support  Cognition:  WNL  ADL's:  Intact  AIMS (if indicated):        Other History   These have been pulled in through the EMR, reviewed, and updated if appropriate.  Family History:  The patient's family history is not on file.  Medical History: Past Medical History:  Diagnosis Date   Asthma    COPD (chronic obstructive pulmonary disease) (HCC)    Diabetes mellitus without complication (HCC)    Fibromyalgia    Hypertension    Stroke [facility]) 2023   2023, 2008 and others. 8 strokes. R hemiparesis  Surgical History: Past Surgical History:  Procedure Laterality Date   APPENDECTOMY     CHOLECYSTECTOMY       Medications:   Current Facility-Administered Medications:    acetaminophen  (TYLENOL ) tablet 650 mg, 650 mg, Oral, Q6H PRN, 650 mg at 08/07/24 0828 **OR** acetaminophen  (TYLENOL ) suppository 650 mg, 650 mg, Rectal, Q6H PRN, Cleatus Delayne GAILS, MD   benztropine  (COGENTIN ) tablet 0.5 mg, 0.5 mg, Oral, BID PRN, Cody Oliger B, NP   buprenorphine  (SUBUTEX ) SL tablet 2 mg, 2 mg, Sublingual, TID, Rashid, Farhan, MD, 2 mg at 08/07/24 9052   enoxaparin  (LOVENOX ) injection 47.5 mg, 0.5 mg/kg, Subcutaneous, Q24H, Cleatus Delayne V, MD, 47.5 mg at 08/07/24 9052   escitalopram  (LEXAPRO ) tablet 20 mg, 20 mg, Oral, Daily, Cleatus Delayne V, MD, 20 mg at 08/07/24 0947   insulin  aspart (novoLOG ) injection 0-15 Units, 0-15 Units, Subcutaneous, TID WC, Cleatus Delayne GAILS, MD, 1 Units at 08/07/24 0955   insulin  aspart (novoLOG ) injection 0-5 Units, 0-5 Units, Subcutaneous, QHS, Cleatus Delayne GAILS, MD, 2 Units at 08/05/24 2130   insulin  glargine  (LANTUS ) injection 10 Units, 10 Units, Subcutaneous, Daily, Rashid, Farhan, MD, 10 Units at 08/07/24 9051   linaclotide (LINZESS) capsule 290 mcg, 290 mcg, Oral, Daily, Milayna, Rotenberg, RPH, 290 mcg at 08/07/24 9051   losartan  (COZAAR ) tablet 100 mg, 100 mg, Oral, Daily, Rashid, Farhan, MD, 100 mg at 08/07/24 0947   lurasidone  (LATUDA ) tablet 120 mg, 120 mg, Oral, QPM, Hunt, Madison H, RPH, 120 mg at 08/06/24 1844   melatonin tablet 5 mg, 5 mg, Oral, QHS PRN, Cleatus Delayne GAILS, MD, 5 mg at 08/06/24 2139   nutrition supplement (JUVEN) (JUVEN) powder packet 1 packet, 1 packet, Oral, BID BM, Rashid, Farhan, MD, 1 packet at 08/07/24 0956   ondansetron  (ZOFRAN ) tablet 4 mg, 4 mg, Oral, Q6H PRN **OR** ondansetron  (ZOFRAN ) injection 4 mg, 4 mg, Intravenous, Q6H PRN, Cleatus Delayne V, MD, 4 mg at 08/06/24 1300   paliperidone  (INVEGA ) 24 hr tablet 6 mg, 6 mg, Oral, QPM, Alp Goldwater B, NP, 6 mg at 08/06/24 1844   pantoprazole  (PROTONIX ) EC tablet 40 mg, 40 mg, Oral, Daily, Rashid, Farhan, MD, 40 mg at 08/07/24 0947   polyethylene glycol (MIRALAX / GLYCOLAX) packet 17 g, 17 g, Oral, Daily, Rashid, Farhan, MD, 17 g at 08/07/24 0946   rosuvastatin  (CRESTOR ) tablet 5 mg, 5 mg, Oral, Daily, Rashid, Farhan, MD, 5 mg at 08/07/24 0947  Allergies: Allergies  Allergen Reactions   Celecoxib Other (See Comments) and Swelling    Other Reaction: OTHER REACTION = SWELLING   Gabapentin Rash and Swelling    Pt says she gets face and throat swelling.   Levetiracetam Rash   Oxycodone-Acetaminophen  Itching, Nausea Only and Swelling    Pt states that nausea was the most significant effect. Pt states that percocet is tolerable when given ondansetron  (ZOFRAN ).   Quetiapine Other (See Comments) and Rash    Increased blood sugars.   Shellfish Allergy Swelling    Scallops, shellfish => swelling   Soap Swelling    [place] soap   Acetaminophen  Nausea And Vomiting   Alprazolam Other (See Comments)    Memory loss    Atorvastatin     Other reaction(s): Muscle Pain   Diazepam Other (See Comments)    Memory loss   Dicyclomine Other (See Comments)    Over-sedation   Ibuprofen Nausea And Vomiting    Other Reaction: TONGUE SWELING/BLEEDING   Ivp Dye [Iodinated Contrast Media] Itching  Oxycodone Itching and Swelling    Swelling of tongue   Propoxyphene     With tylenol    Sertraline Other (See Comments)    Other reaction(s): Unknown unknown   Trazodone Other (See Comments)    Over-sedation   Acetaminophen -Codeine Itching and Nausea Only   Aspirin Nausea Only and Swelling    Sinus swelling    Codeine Itching and Nausea Only    No associated rash. Itching stops on its own after a few days, but faster when given diphenhydramine.   Tramadol Swelling    Facial swelling   Zolpidem Nausea Only and Other (See Comments)    Causes memory loss    Zelda Sharps, NP

## 2024-08-07 NOTE — Discharge Summary (Signed)
 Physician Discharge Summary   Patient: Alicia Mayo MRN: [id number] DOB: 02-10-1966  Admit date:     08/02/2024  Discharge date: 08/07/24  Discharge Physician: Deliliah Room   PCP: Dante Elsie Celestia MADISON, MD   Recommendations at discharge:    F/u with your PCP in one week F/u with your psychiatrist. Please call their office to make an appointment. Continue taking meds as prescribed.  Discharge Diagnoses: Principal Problem:   Acute metabolic encephalopathy Active Problems:   Urinary tract infection   Hypotension   Uncontrolled type 2 diabetes mellitus with hyperglycemia, with long-term current use of insulin  (HCC)   AKI (acute kidney injury)   Electrolyte abnormality   Fibromyalgia   Bipolar II disorder (HCC)   History of stroke   Chronic pain   Altered mental status    Hospital Course:   58 y.o. female with medical history significant for Insulin -dependent type 2 diabetes, diabetic gastroparesis, chronic pain on chronic narcotics, HTN, anxiety and depression, asthma being admitted with acute metabolic encephalopathy and urinary tract infection.  She presented to the ED with confusion and weakness in the setting of recent diarrhea. Pysch consulted and medication adjustments done, invega  dose was increased to 6 mg (3 mg previously) and plan is to reduce the dose of latuda  as an outpatient. Discontinued risperidone as per psych recommendations. Frequency of pregabalin  was reduced to twice a day. Patient was advised to follow up with her own psychiatrist in a week or two. She does have aide at home. HHPT arranged on discharge.  Her mentation was back to baseline on the day of the discharge.       Consultants: psychiatry Procedures performed: None  Disposition: Home health Diet recommendation:  Cardiac and Carb modified diet DISCHARGE MEDICATION: Allergies as of 08/07/2024       Reactions   Celecoxib Other (See Comments), Swelling   Other Reaction: OTHER REACTION =  SWELLING   Gabapentin Rash, Swelling   Pt says she gets face and throat swelling.   Levetiracetam Rash   Oxycodone-acetaminophen  Itching, Nausea Only, Swelling   Pt states that nausea was the most significant effect. Pt states that percocet is tolerable when given ondansetron  (ZOFRAN ).   Quetiapine Other (See Comments), Rash   Increased blood sugars.   Shellfish Allergy Swelling   Scallops, shellfish => swelling   Soap Swelling   [place] soap   Acetaminophen  Nausea And Vomiting   Alprazolam Other (See Comments)   Memory loss   Atorvastatin    Other reaction(s): Muscle Pain   Diazepam Other (See Comments)   Memory loss   Dicyclomine Other (See Comments)   Over-sedation   Ibuprofen Nausea And Vomiting   Other Reaction: TONGUE SWELING/BLEEDING   Ivp Dye [iodinated Contrast Media] Itching   Oxycodone Itching, Swelling   Swelling of tongue   Propoxyphene    With tylenol    Sertraline Other (See Comments)   Other reaction(s): Unknown unknown   Trazodone Other (See Comments)   Over-sedation   Acetaminophen -codeine Itching, Nausea Only   Aspirin Nausea Only, Swelling   Sinus swelling   Codeine Itching, Nausea Only   No associated rash. Itching stops on its own after a few days, but faster when given diphenhydramine.   Tramadol Swelling   Facial swelling   Zolpidem Nausea Only, Other (See Comments)   Causes memory loss        Medication List     STOP taking these medications    furosemide  20 MG tablet Commonly known as:  Lasix    mirtazapine  30 MG tablet Commonly known as: REMERON    mirtazapine  45 MG tablet Commonly known as: REMERON    potassium chloride  SA 20 MEQ tablet Commonly known as: KLOR-CON  M   risperiDONE 1 MG tablet Commonly known as: RISPERDAL       TAKE these medications    albuterol 108 (90 Base) MCG/ACT inhaler Commonly known as: VENTOLIN HFA Inhale 2 puffs into the lungs every 4 (four) hours as needed.   ammonium lactate 12 %  cream Commonly known as: AMLACTIN Apply 1 Application topically as needed.   benztropine  1 MG tablet Commonly known as: COGENTIN  Take 0.5 tablets (0.5 mg total) by mouth 2 (two) times daily as needed for tremors. What changed:  how much to take when to take this reasons to take this   buprenorphine  2 MG Subl SL tablet Commonly known as: SUBUTEX  Place 2 mg under the tongue in the morning, at noon, and at bedtime.   busPIRone 15 MG tablet Commonly known as: BUSPAR Take 15 mg by mouth 3 (three) times daily.   escitalopram  20 MG tablet Commonly known as: LEXAPRO  Take 20 mg by mouth daily.   fluticasone-salmeterol 250-50 MCG/ACT Aepb Commonly known as: ADVAIR Inhale 1 puff into the lungs 2 (two) times daily.   Insulin  Aspart FlexPen 100 UNIT/ML Commonly known as: NOVOLOG  Inject 100 Units into the muscle See admin instructions.   Invega  Sustenna 156 MG/ML Susy injection Generic drug: paliperidone  Inject 156 mg into the muscle every 30 (thirty) days.   lidocaine  5 % Commonly known as: LIDODERM  Place 1 patch onto the skin daily.   Linzess 290 MCG Caps capsule Generic drug: linaclotide Take 290 mcg by mouth daily.   losartan  100 MG tablet Commonly known as: COZAAR  Take 100 mg by mouth daily.   Lurasidone  HCl 120 MG Tabs Take 1 tablet by mouth daily. What changed: Another medication with the same name was removed. Continue taking this medication, and follow the directions you see here.   metFORMIN 500 MG 24 hr tablet Commonly known as: GLUCOPHAGE-XR Take 1,000 mg by mouth 2 (two) times daily.   metoprolol tartrate 50 MG tablet Commonly known as: LOPRESSOR Take 50 mg by mouth 2 (two) times daily.   ondansetron  4 MG disintegrating tablet Commonly known as: Zofran  ODT Take 1 tablet (4 mg total) by mouth every 8 (eight) hours as needed.   paliperidone  3 MG 24 hr tablet Commonly known as: INVEGA  Take 2 tablets (6 mg total) by mouth at bedtime. What changed: how  much to take   pantoprazole  40 MG tablet Commonly known as: PROTONIX  Take 40 mg by mouth daily.   polyethylene glycol 17 g packet Commonly known as: MIRALAX / GLYCOLAX Take 17 g by mouth daily as needed.   pregabalin  150 MG capsule Commonly known as: LYRICA  Take 1 capsule (150 mg total) by mouth 2 (two) times daily. What changed: when to take this   rosuvastatin  5 MG tablet Commonly known as: CRESTOR  Take 5 mg by mouth daily.   trospium 20 MG tablet Commonly known as: SANCTURA Take 20 mg by mouth 2 (two) times daily.        Follow-up Information     Bynum, Elsie Bohr IV, MD. Schedule an appointment as soon as possible for a visit in 1 week(s).   Specialty: Family Medicine Contact information: [address] [place] [address] [phone]                Discharge Exam:  Filed Weights   08/02/24 1543  Weight: 95.3 kg   General exam: Appears alert and awake Respiratory system: Clear to auscultation. Respiratory effort normal. Cardiovascular system: S1 & S2 heard, RRR. No JVD, murmurs, rubs, gallops or clicks. No pedal edema. Gastrointestinal system: Abdomen is nondistended, soft and nontender. No organomegaly or masses felt. Normal bowel sounds heard. Central nervous system: Awake and alert. No focal neurological deficits. Extremities: Symmetric 5 x 5 power. Skin: No rashes, lesions or ulcers  Condition at discharge: good  The results of significant diagnostics from this hospitalization (including imaging, microbiology, ancillary and laboratory) are listed below for reference.   Imaging Studies: CT ABDOMEN PELVIS WO CONTRAST Result Date: 08/02/2024 CLINICAL DATA:  Left lower quadrant pain. EXAM: CT ABDOMEN AND PELVIS WITHOUT CONTRAST TECHNIQUE: Multidetector CT imaging of the abdomen and pelvis was performed following the standard protocol without IV contrast. RADIATION DOSE REDUCTION: This exam was performed according to the departmental  dose-optimization program which includes automated exposure control, adjustment of the mA and/or kV according to patient size and/or use of iterative reconstruction technique. COMPARISON:  July 01, 2023 FINDINGS: Lower chest: No acute abnormality. Hepatobiliary: No focal liver abnormality is seen. Status post cholecystectomy. No biliary dilatation. Pancreas: Unremarkable. No pancreatic ductal dilatation or surrounding inflammatory changes. Spleen: Normal in size without focal abnormality. Adrenals/Urinary Tract: Adrenal glands are unremarkable. Kidneys are normal, without renal calculi, focal lesion, or hydronephrosis. Bladder is unremarkable. Stomach/Bowel: Stomach is within normal limits. Appendix appears normal. No evidence of bowel wall thickening, distention, or inflammatory changes. Vascular/Lymphatic: Aortic atherosclerosis. No enlarged abdominal or pelvic lymph nodes. Reproductive: Status post hysterectomy. No adnexal masses. Other: No abdominal wall hernia or abnormality. No abdominopelvic ascites. Musculoskeletal: No acute or significant osseous findings. IMPRESSION: 1. No acute or active process within the abdomen or pelvis. 2. Evidence of prior cholecystectomy and hysterectomy. 3. Aortic atherosclerosis. Electronically Signed   By: Suzen Dials M.D.   On: 08/02/2024 20:01   CT Head Wo Contrast Result Date: 08/02/2024 CLINICAL DATA:  Mental status change, unknown cause. Generalized weakness. EXAM: CT HEAD WITHOUT CONTRAST TECHNIQUE: Contiguous axial images were obtained from the base of the skull through the vertex without intravenous contrast. RADIATION DOSE REDUCTION: This exam was performed according to the departmental dose-optimization program which includes automated exposure control, adjustment of the mA and/or kV according to patient size and/or use of iterative reconstruction technique. COMPARISON:  Head CT 03/10/2024 FINDINGS: Brain: There is no evidence of an acute infarct,  intracranial hemorrhage, mass, midline shift, or extra-axial fluid collection. Cerebral volume is normal. The ventricles are normal in size. Vascular: No hyperdense vessel. Skull: No fracture or suspicious lesion. Sinuses/Orbits: Minimal mucosal thickening in the included paranasal sinuses. Clear mastoid air cells. Unremarkable orbits. Other: None. IMPRESSION: Unremarkable CT appearance of the brain. Electronically Signed   By: Dasie Hamburg M.D.   On: 08/02/2024 19:50    Microbiology: Results for orders placed or performed during the hospital encounter of 08/02/24  Urine Culture (for pregnant, neutropenic or urologic patients or patients with an indwelling urinary catheter)     Status: Abnormal   Collection Time: 08/02/24  9:17 PM   Specimen: Urine, Clean Catch  Result Value Ref Range Status   Specimen Description   Final    URINE, CLEAN CATCH Performed at [facility], [address] [place], [place] [address]    Special Requests   Final    NONE Performed at [facility], [address], [place], [place]  [address]    Culture (A)  Final    >=100,000 COLONIES/mL STAPHYLOCOCCUS EPIDERMIDIS CORRECTED RESULTS PREVIOUSLY REPORTED AS: GRAM NEGATIVE RODS CORRECTED RESULTS CALLED TO: EUSTACE JEST RN, AT 1250 08/04/24 D. VANHOOK Performed at [facility], [address], [place], [place] [address]    Report Status 08/05/2024 FINAL  Final   Organism ID, Bacteria STAPHYLOCOCCUS EPIDERMIDIS (A)  Final      Susceptibility   Staphylococcus epidermidis - MIC*    CIPROFLOXACIN <=0.5 SENSITIVE Sensitive     GENTAMICIN <=0.5 SENSITIVE Sensitive     NITROFURANTOIN <=16 SENSITIVE Sensitive     OXACILLIN <=0.25 SENSITIVE Sensitive     TETRACYCLINE 2 SENSITIVE Sensitive     VANCOMYCIN 1 SENSITIVE Sensitive     TRIMETH/SULFA <=10 SENSITIVE Sensitive     RIFAMPIN <=0.5 SENSITIVE Sensitive     Inducible Clindamycin NEGATIVE Sensitive     * >=100,000 COLONIES/mL STAPHYLOCOCCUS  EPIDERMIDIS  Blood culture (routine x 2)     Status: Abnormal   Collection Time: 08/03/24 12:05 AM   Specimen: BLOOD RIGHT FOREARM  Result Value Ref Range Status   Specimen Description   Final    BLOOD RIGHT FOREARM Performed at [facility], [address], [place], [place] [address]    Special Requests   Final    BOTTLES DRAWN AEROBIC ONLY Blood Culture adequate volume Performed at [facility], [address] [place], [place] [address]    Culture  Setup Time   Final    GRAM POSITIVE COCCI AEROBIC BOTTLE ONLY CRITICAL RESULT CALLED TO, READ BACK BY AND VERIFIED WITH: NATHAN PARKS @1950  ON 08/03/24 SKL    Culture (A)  Final    STAPHYLOCOCCUS HOMINIS THE SIGNIFICANCE OF ISOLATING THIS ORGANISM FROM A SINGLE SET OF BLOOD CULTURES WHEN MULTIPLE SETS ARE DRAWN IS UNCERTAIN. PLEASE NOTIFY THE MICROBIOLOGY DEPARTMENT WITHIN ONE WEEK IF SPECIATION AND SENSITIVITIES ARE REQUIRED. Performed at [facility], [address], [place], [place] [address]    Report Status 08/06/2024 FINAL  Final  Blood Culture ID Panel (Reflexed)     Status: Abnormal   Collection Time: 08/03/24 12:05 AM  Result Value Ref Range Status   Enterococcus faecalis NOT DETECTED NOT DETECTED Final   Enterococcus Faecium NOT DETECTED NOT DETECTED Final   Listeria monocytogenes NOT DETECTED NOT DETECTED Final   Staphylococcus species DETECTED (A) NOT DETECTED Final    Comment: CRITICAL RESULT CALLED TO, READ BACK BY AND VERIFIED WITH: NATHAN PARKS @1950  ON 08/03/24 SKL    Staphylococcus aureus (BCID) NOT DETECTED NOT DETECTED Final   Staphylococcus epidermidis NOT DETECTED NOT DETECTED Final   Staphylococcus lugdunensis NOT DETECTED NOT DETECTED Final   Streptococcus species NOT DETECTED NOT DETECTED Final   Streptococcus agalactiae NOT DETECTED NOT DETECTED Final   Streptococcus pneumoniae NOT DETECTED NOT DETECTED Final   Streptococcus pyogenes NOT DETECTED NOT DETECTED Final    A.calcoaceticus-baumannii NOT DETECTED NOT DETECTED Final   Bacteroides fragilis NOT DETECTED NOT DETECTED Final   Enterobacterales NOT DETECTED NOT DETECTED Final   Enterobacter cloacae complex NOT DETECTED NOT DETECTED Final   Escherichia coli NOT DETECTED NOT DETECTED Final   Klebsiella aerogenes NOT DETECTED NOT DETECTED Final   Klebsiella oxytoca NOT DETECTED NOT DETECTED Final   Klebsiella pneumoniae NOT DETECTED NOT DETECTED Final   Proteus species NOT DETECTED NOT DETECTED Final   Salmonella species NOT DETECTED NOT DETECTED Final   Serratia marcescens NOT DETECTED NOT DETECTED Final   Haemophilus influenzae  NOT DETECTED NOT DETECTED Final   Neisseria meningitidis NOT DETECTED NOT DETECTED Final   Pseudomonas aeruginosa NOT DETECTED NOT DETECTED Final   Stenotrophomonas maltophilia NOT DETECTED NOT DETECTED Final   Candida albicans NOT DETECTED NOT DETECTED Final   Candida auris NOT DETECTED NOT DETECTED Final   Candida glabrata NOT DETECTED NOT DETECTED Final   Candida krusei NOT DETECTED NOT DETECTED Final   Candida parapsilosis NOT DETECTED NOT DETECTED Final   Candida tropicalis NOT DETECTED NOT DETECTED Final   Cryptococcus neoformans/gattii NOT DETECTED NOT DETECTED Final    Comment: Performed at [facility], [address] [place], [place] [address]  Blood culture (routine x 2)     Status: None (Preliminary result)   Collection Time: 08/03/24 12:19 AM   Specimen: BLOOD LEFT HAND  Result Value Ref Range Status   Specimen Description BLOOD LEFT HAND  Final   Special Requests   Final    BOTTLES DRAWN AEROBIC ONLY Blood Culture results may not be optimal due to an inadequate volume of blood received in culture bottles   Culture   Final    NO GROWTH 4 DAYS Performed at [facility], [address] [place], [place] [address]    Report Status PENDING  Incomplete    Labs: CBC: Recent Labs  Lab 08/02/24 1545 08/03/24 0503  WBC 8.1 7.6  HGB  10.8* 10.4*  HCT 33.2* 31.6*  MCV 91.7 90.8  PLT 240 207   Basic Metabolic Panel: Recent Labs  Lab 08/02/24 1545 08/03/24 0503 08/04/24 1258  NA 138 144 141  K 3.0* 2.9* 3.1*  CL 98 102 100  CO2 29 26 28   GLUCOSE 214* 152* 219*  BUN 9 8 8   CREATININE 1.15* 0.71 0.77  CALCIUM  9.5 8.9 9.3  MG 1.6* 1.7  --    Liver Function Tests: Recent Labs  Lab 08/02/24 1545  AST 18  ALT 15  ALKPHOS 63  BILITOT 0.7  PROT 7.9  ALBUMIN 3.9   CBG: Recent Labs  Lab 08/05/24 2123 08/06/24 0820 08/06/24 1141 08/06/24 1954 08/07/24 0754  GLUCAP 203* 141* 156* 178* 121*    Discharge time spent: 42 minutes.  Signed: Deliliah Room, MD [facility] 08/07/2024

## 2024-08-07 NOTE — TOC Transition Note (Signed)
 Transition of Care [facility]) - Discharge Note   Patient Details  Name: Alicia Mayo MRN: [id number] Date of Birth: 01/23/1966  Transition of Care [facility]) CM/SW Contact:  Corean ONEIDA Haddock, RN Phone Number: 08/07/2024, 11:42 AM   Clinical Narrative:     Patient to dc today Sarah with [facility] notified of dc          Patient Goals and CMS Choice            Discharge Placement                       Discharge Plan and Services Additional resources added to the After Visit Summary for                                       Social Drivers of Health (SDOH) Interventions SDOH Screenings   Food Insecurity: Food Insecurity Present (08/31/2023)   Received from [facility]  Housing: Low Risk  (03/31/2024)   Received from [facility]  Transportation Needs: No Transportation Needs (08/31/2023)   Received from [facility]  Utilities: Not At Risk (08/31/2023)   Received from [facility]  Financial Resource Strain: Low Risk  (08/31/2023)   Received from [facility]  Physical Activity: Inactive (11/03/2022)   Received from [facility]  Social Connections: Socially Isolated (11/03/2022)   Received from [facility]  Stress: No Stress Concern Present (11/03/2022)   Received from [facility]  Tobacco Use: High Risk (08/02/2024)     Readmission Risk Interventions     No data to display

## 2024-08-07 NOTE — Progress Notes (Signed)
 Mobility Specialist Progress Note:    08/07/24 1141  Mobility  Activity Ambulated with assistance  Level of Assistance Contact guard assist, steadying assist  Assistive Device Front wheel walker  Distance Ambulated (ft) 150 ft  Range of Motion/Exercises Active;All extremities  Activity Response Tolerated well  Mobility visit 1 Mobility  Mobility Specialist Start Time (ACUTE ONLY) 1126  Mobility Specialist Stop Time (ACUTE ONLY) 1141  Mobility Specialist Time Calculation (min) (ACUTE ONLY) 15 min   Pt received in chair, eager for mobility. Required CGA to stand and supervision to ambulate with RW. Tolerated well, asx throughout. Returned to chair, belongings in reach. All needs met.  Sherrilee Ditty Mobility Specialist Please contact via Special educational needs teacher or  Rehab office at [phone]

## 2024-08-07 NOTE — Plan of Care (Signed)

## 2024-08-07 NOTE — Progress Notes (Signed)
 Physical Therapy Treatment Patient Details Name: Alicia Mayo MRN: [id number] DOB: Jul 28, 1966 Today's Date: 08/07/2024   History of Present Illness presented to ER secondary to progressive weakness; admitted for management of acute metabolic encephalopathy, UTI    PT Comments  Pt demonstrates functional mobility at an overall CGA to supervision level with transfers, functional standing, and gait (130') with RW.  Pain and decreased activity tolerance continues to limit mobility.  Pt will benefit from continued PT services upon discharge to safely address deficits listed in patient problem list for decreased caregiver assistance and eventual return to PLOF.      If plan is discharge home, recommend the following: A little help with walking and/or transfers;A little help with bathing/dressing/bathroom   Can travel by private vehicle        Equipment Recommendations  Rolling walker (2 wheels)    Recommendations for Other Services       Precautions / Restrictions Precautions Precautions: Fall Recall of Precautions/Restrictions: Impaired Restrictions Weight Bearing Restrictions Per Provider Order: No     Mobility  Bed Mobility Overal bed mobility: Modified Independent Bed Mobility: Supine to Sit     Supine to sit: HOB elevated, Used rails     General bed mobility comments: no cues or physical assist needed.    Transfers Overall transfer level: Needs assistance Equipment used: Rolling walker (2 wheels) Transfers: Sit to/from Stand, Bed to chair/wheelchair/BSC Sit to Stand: Contact guard assist   Step pivot transfers: Supervision       General transfer comment: cues for safe handplacement, slight imbalance posteriorly with sit>stands    Ambulation/Gait Ambulation/Gait assistance: Supervision Gait Distance (Feet): 130 Feet Assistive device: Rolling walker (2 wheels) Gait Pattern/deviations: Shuffle, Decreased stride length       General Gait Details: Pt reports  pain in bilateral knees. No LOB, steady overall.   Stairs             Wheelchair Mobility     Tilt Bed    Modified Rankin (Stroke Patients Only)       Balance Overall balance assessment: Needs assistance Sitting-balance support: No upper extremity supported, Feet supported Sitting balance-Leahy Scale: Good     Standing balance support: During functional activity, Single extremity supported   Standing balance comment: slight imbalance posteriorly with 1 to no UE support requiring CGA.                            Communication Communication Communication: No apparent difficulties  Cognition Arousal: Alert     PT - Cognitive impairments: No family/caregiver present to determine baseline                         Following commands: Intact Following commands impaired: Only follows one step commands consistently    Cueing Cueing Techniques: Verbal cues  Exercises      General Comments        Pertinent Vitals/Pain Pain Assessment Pain Assessment: Faces Faces Pain Scale: Hurts little more Pain Location: all overall Pain Descriptors / Indicators: Aching Pain Intervention(s): Premedicated before session, Limited activity within patient's tolerance, Monitored during session    Home Living                          Prior Function            PT Goals (current goals can now be found in the care plan  section) Acute Rehab PT Goals Patient Stated Goal: to walk a little bit PT Goal Formulation: With patient Time For Goal Achievement: 08/18/24 Potential to Achieve Goals: Good Progress towards PT goals: Progressing toward goals    Frequency    Min 2X/week      PT Plan      Co-evaluation              AM-PAC PT 6 Clicks Mobility   Outcome Measure  Help needed turning from your back to your side while in a flat bed without using bedrails?: None Help needed moving from lying on your back to sitting on the side of  a flat bed without using bedrails?: A Little Help needed moving to and from a bed to a chair (including a wheelchair)?: A Little Help needed standing up from a chair using your arms (e.g., wheelchair or bedside chair)?: A Little Help needed to walk in hospital room?: A Little Help needed climbing 3-5 steps with a railing? : A Little 6 Click Score: 19    End of Session Equipment Utilized During Treatment: Gait belt Activity Tolerance: Patient tolerated treatment well Patient left: in chair;with call bell/phone within reach Nurse Communication: Mobility status PT Visit Diagnosis: Muscle weakness (generalized) (M62.81);Difficulty in walking, not elsewhere classified (R26.2)     Time: 9048-8988 PT Time Calculation (min) (ACUTE ONLY): 20 min  Charges:    $Therapeutic Activity: 8-22 mins PT General Charges $$ ACUTE PT VISIT: 1 Visit                    Harland Irving, PTA  08/07/24, 10:34 AM

## 2024-08-08 LAB — CULTURE, BLOOD (ROUTINE X 2): Culture: NO GROWTH

## 2024-08-09 ENCOUNTER — Emergency Department

## 2024-08-09 ENCOUNTER — Other Ambulatory Visit: Payer: Self-pay

## 2024-08-09 ENCOUNTER — Emergency Department
Admission: EM | Admit: 2024-08-09 | Discharge: 2024-08-10 | Disposition: A | Source: Home / Self Care | Attending: Emergency Medicine | Admitting: Emergency Medicine

## 2024-08-09 DIAGNOSIS — E119 Type 2 diabetes mellitus without complications: Secondary | ICD-10-CM | POA: Insufficient documentation

## 2024-08-09 DIAGNOSIS — Z79899 Other long term (current) drug therapy: Secondary | ICD-10-CM | POA: Insufficient documentation

## 2024-08-09 DIAGNOSIS — K5900 Constipation, unspecified: Secondary | ICD-10-CM | POA: Insufficient documentation

## 2024-08-09 DIAGNOSIS — Z794 Long term (current) use of insulin: Secondary | ICD-10-CM | POA: Insufficient documentation

## 2024-08-09 DIAGNOSIS — R1084 Generalized abdominal pain: Secondary | ICD-10-CM | POA: Diagnosis not present

## 2024-08-09 DIAGNOSIS — I10 Essential (primary) hypertension: Secondary | ICD-10-CM | POA: Insufficient documentation

## 2024-08-09 DIAGNOSIS — N390 Urinary tract infection, site not specified: Secondary | ICD-10-CM

## 2024-08-09 DIAGNOSIS — J449 Chronic obstructive pulmonary disease, unspecified: Secondary | ICD-10-CM | POA: Insufficient documentation

## 2024-08-09 DIAGNOSIS — Z7951 Long term (current) use of inhaled steroids: Secondary | ICD-10-CM | POA: Insufficient documentation

## 2024-08-09 DIAGNOSIS — Z8673 Personal history of transient ischemic attack (TIA), and cerebral infarction without residual deficits: Secondary | ICD-10-CM | POA: Insufficient documentation

## 2024-08-09 DIAGNOSIS — Z7984 Long term (current) use of oral hypoglycemic drugs: Secondary | ICD-10-CM | POA: Insufficient documentation

## 2024-08-09 LAB — BASIC METABOLIC PANEL WITH GFR
Anion gap: 11 (ref 5–15)
BUN: 12 mg/dL (ref 6–20)
CO2: 30 mmol/L (ref 22–32)
Calcium: 9.8 mg/dL (ref 8.9–10.3)
Chloride: 101 mmol/L (ref 98–111)
Creatinine, Ser: 0.93 mg/dL (ref 0.44–1.00)
GFR, Estimated: >60 mL/min (ref >60)
Glucose, Bld: 158 mg/dL — ABNORMAL HIGH (ref 70–99)
Potassium: 3.1 mmol/L — ABNORMAL LOW (ref 3.5–5.1)
Sodium: 142 mmol/L (ref 135–145)

## 2024-08-09 LAB — CBC WITH DIFFERENTIAL/PLATELET
Abs Immature Granulocytes: 0.02 K/uL (ref 0.00–0.07)
Basophils Absolute: 0 K/uL (ref 0.0–0.1)
Basophils Relative: 0 %
Eosinophils Absolute: 0.1 K/uL (ref 0.0–0.5)
Eosinophils Relative: 1 %
HCT: 34.1 % — ABNORMAL LOW (ref 36.0–46.0)
Hemoglobin: 11.1 g/dL — ABNORMAL LOW (ref 12.0–15.0)
Immature Granulocytes: 0 %
Lymphocytes Relative: 41 %
Lymphs Abs: 3.6 K/uL (ref 0.7–4.0)
MCH: 30.3 pg (ref 26.0–34.0)
MCHC: 32.6 g/dL (ref 30.0–36.0)
MCV: 93.2 fL (ref 80.0–100.0)
Monocytes Absolute: 0.4 K/uL (ref 0.1–1.0)
Monocytes Relative: 5 %
Neutro Abs: 4.6 K/uL (ref 1.7–7.7)
Neutrophils Relative %: 53 %
Platelets: 213 K/uL (ref 150–400)
RBC: 3.66 MIL/uL — ABNORMAL LOW (ref 3.87–5.11)
RDW: 13.3 % (ref 11.5–15.5)
WBC: 8.7 K/uL (ref 4.0–10.5)
nRBC: 0 % (ref 0.0–0.2)

## 2024-08-09 LAB — URINALYSIS, ROUTINE W REFLEX MICROSCOPIC
Bilirubin Urine: NEGATIVE
Glucose, UA: NEGATIVE mg/dL
Hgb urine dipstick: NEGATIVE
Ketones, ur: NEGATIVE mg/dL
Nitrite: NEGATIVE
Protein, ur: NEGATIVE mg/dL
Specific Gravity, Urine: 1.019 (ref 1.005–1.030)
pH: 6 (ref 5.0–8.0)

## 2024-08-09 LAB — RESP PANEL BY RT-PCR (RSV, FLU A&B, COVID)  RVPGX2
Influenza A by PCR: NEGATIVE
Influenza B by PCR: NEGATIVE
Resp Syncytial Virus by PCR: NEGATIVE
SARS Coronavirus 2 by RT PCR: NEGATIVE

## 2024-08-09 MED ORDER — ONDANSETRON HCL 4 MG/2ML IJ SOLN
4.0000 mg | Freq: Once | INTRAMUSCULAR | Status: DC
  Filled 2024-08-09: qty 2

## 2024-08-09 MED ORDER — FENTANYL CITRATE PF 50 MCG/ML IJ SOSY
50.0000 ug | PREFILLED_SYRINGE | Freq: Once | INTRAMUSCULAR | Status: DC
  Filled 2024-08-09: qty 1

## 2024-08-09 MED ORDER — SODIUM CHLORIDE 0.9 % IV BOLUS (SEPSIS): 500.0000 mL | Freq: Once | INTRAVENOUS | Status: DC

## 2024-08-09 MED ORDER — SODIUM CHLORIDE 0.9 % IV SOLN
2.0000 g | Freq: Once | INTRAVENOUS | Status: DC
  Filled 2024-08-09: qty 20

## 2024-08-09 MED ORDER — POTASSIUM CHLORIDE CRYS ER 20 MEQ PO TBCR
40.0000 meq | EXTENDED_RELEASE_TABLET | Freq: Once | ORAL | Status: AC
  Administered 2024-08-10: 40 meq via ORAL
  Filled 2024-08-09: qty 2

## 2024-08-09 NOTE — ED Triage Notes (Signed)
 Pt reports constipation x8 days and generalized body aches. Pt states she was just admitted to the hospital for AMS and UTI, pt was sent home with at home rehab. Pt is a&o x4 at this time.

## 2024-08-09 NOTE — ED Provider Notes (Signed)
 [facility] Provider Note    Event Date/Time   First MD Initiated Contact with Patient 08/09/24 2329     (approximate)   History   Constipation and Generalized Body Aches   HPI  Alicia Mayo is a 58 y.o. female with history of hypertension, diabetes, stroke, COPD, fibromyalgia, chronic constipation on Linzess who presents to the emergency department generalized abdominal pain, nausea, unable to have a bowel movement for over a week and now no longer passing gas.  She is not sure if she has ever had a bowel obstruction.  Has tried her normal medications without relief.  Has had prior appendectomy, cholecystectomy, total hysterectomy.  Also complains of diffuse bodyaches.  No chest pain, shortness of breath, fever, cough.   History provided by patient, caregiver.    Past Medical History:  Diagnosis Date   Asthma    COPD (chronic obstructive pulmonary disease) (HCC)    Diabetes mellitus without complication (HCC)    Fibromyalgia    Hypertension    Stroke [facility]) 2023   2023, 2008 and others. 8 strokes. R hemiparesis    Past Surgical History:  Procedure Laterality Date   APPENDECTOMY     CHOLECYSTECTOMY      MEDICATIONS:  Prior to Admission medications   Medication Sig Start Date End Date Taking? Authorizing Provider  albuterol (VENTOLIN HFA) 108 (90 Base) MCG/ACT inhaler Inhale 2 puffs into the lungs every 4 (four) hours as needed.    [provider]  ammonium lactate (AMLACTIN) 12 % cream Apply 1 Application topically as needed. 07/03/24   [provider]  benztropine  (COGENTIN ) 1 MG tablet Take 0.5 tablets (0.5 mg total) by mouth 2 (two) times daily as needed for tremors. 08/07/24   Dino Antu, MD  buprenorphine  (SUBUTEX ) 2 MG SUBL SL tablet Place 2 mg under the tongue in the morning, at noon, and at bedtime.    [provider]  busPIRone (BUSPAR) 15 MG tablet Take 15 mg by mouth 3 (three) times daily.    [provider]  escitalopram  (LEXAPRO ) 20 MG tablet Take 20 mg by mouth daily.    [provider]  fluticasone-salmeterol (ADVAIR) 250-50 MCG/ACT AEPB Inhale 1 puff into the lungs 2 (two) times daily.    [provider]  Insulin  Aspart FlexPen (NOVOLOG ) 100 UNIT/ML Inject 100 Units into the muscle See admin instructions. 07/28/24   [provider]  INVEGA  SUSTENNA 156 MG/ML SUSY injection Inject 156 mg into the muscle every 30 (thirty) days. 06/15/24   [provider]  lidocaine  (LIDODERM ) 5 % Place 1 patch onto the skin daily.    [provider]  LINZESS 290 MCG CAPS capsule Take 290 mcg by mouth daily.    [provider]  losartan  (COZAAR ) 100 MG tablet Take 100 mg by mouth daily.    [provider]  Lurasidone  HCl 120 MG TABS Take 1 tablet by mouth daily. 07/27/24   [provider]  metFORMIN (GLUCOPHAGE-XR) 500 MG 24 hr tablet Take 1,000 mg by mouth 2 (two) times daily.    [provider]  metoprolol tartrate (LOPRESSOR) 50 MG tablet Take 50 mg by mouth 2 (two) times daily.    [provider]  ondansetron  (ZOFRAN  ODT) 4 MG disintegrating tablet Take 1 tablet (4 mg total) by mouth every 8 (eight) hours as needed. 03/09/21   Edelmiro Leash, MD  paliperidone  (INVEGA ) 3 MG 24 hr tablet Take 2 tablets (6 mg total) by  mouth at bedtime. 08/07/24   Rashid, Farhan, MD  pantoprazole  (PROTONIX ) 40 MG tablet Take 40 mg by mouth daily.    [provider]  polyethylene glycol (MIRALAX / GLYCOLAX) 17 g packet Take 17 g by mouth daily as needed. 01/14/24   [provider]  pregabalin  (LYRICA ) 150 MG capsule Take 1 capsule (150 mg total) by mouth 2 (two) times daily. 08/07/24   Dino Antu, MD  rosuvastatin  (CRESTOR ) 5 MG tablet Take 5 mg by mouth daily.    [provider]  trospium (SANCTURA) 20 MG tablet Take 20 mg by mouth 2 (two) times daily.    [provider]    Physical Exam    Triage Vital Signs: ED Triage Vitals  Encounter Vitals Group     BP 08/09/24 2111 (!) 159/97     Girls Systolic BP Percentile --      Girls Diastolic BP Percentile --      Boys Systolic BP Percentile --      Boys Diastolic BP Percentile --      Pulse Rate 08/09/24 2111 98     Resp 08/09/24 2111 17     Temp 08/09/24 2111 97.7 F (36.5 C)     Temp src --      SpO2 08/09/24 2111 98 %     Weight 08/09/24 2110 214 lb (97.1 kg)     Height 08/09/24 2110 5' 7 (1.702 m)     Head Circumference --      Peak Flow --      Pain Score 08/09/24 2110 10     Pain Loc --      Pain Education --      Exclude from Growth Chart --     Most recent vital signs: Vitals:   08/10/24 0000 08/10/24 0209  BP: 126/88 (!) 125/93  Pulse: 82 78  Resp:  18  Temp:  98 F (36.7 C)  SpO2: 98% 97%    CONSTITUTIONAL: Alert, responds appropriately to questions.  Chronically ill-appearing HEAD: Normocephalic, atraumatic EYES: Conjunctivae clear, pupils appear equal, sclera nonicteric ENT: normal nose; moist mucous membranes NECK: Supple, normal ROM CARD: RRR; S1 and S2 appreciated RESP: Normal chest excursion without splinting or tachypnea; breath sounds clear and equal bilaterally; no wheezes, no rhonchi, no rales, no hypoxia or respiratory distress, speaking full sentences ABD/GI: Distended, tender to palpation diffusely without guarding or rebound, no peritoneal signs BACK: The back appears normal EXT: Normal ROM in all joints; no deformity noted, edema noted to bilateral dorsal feet SKIN: Normal color for age and race; warm; no rash on exposed skin NEURO: Moves all extremities equally, normal speech PSYCH: The patient's mood and manner are appropriate.   ED Results / Procedures / Treatments   LABS: (all labs ordered are listed, but only abnormal results are displayed) Labs Reviewed  CBC WITH DIFFERENTIAL/PLATELET - Abnormal; Notable for the following components:      Result Value   RBC 3.66  (*)    Hemoglobin 11.1 (*)    HCT 34.1 (*)    All other components within normal limits  BASIC METABOLIC PANEL WITH GFR - Abnormal; Notable for the following components:   Potassium 3.1 (*)    Glucose, Bld 158 (*)    All other components within normal limits  URINALYSIS, ROUTINE W REFLEX MICROSCOPIC - Abnormal; Notable for the following components:   Color, Urine YELLOW (*)    APPearance CLOUDY (*)    Leukocytes,Ua LARGE (*)  Bacteria, UA MANY (*)    All other components within normal limits  RESP PANEL BY RT-PCR (RSV, FLU A&B, COVID)  [id number]  URINE CULTURE     EKG:  RADIOLOGY: My personal review and interpretation of imaging: Constipation.  No bowel obstruction.  I have personally reviewed all radiology reports.   CT ABDOMEN PELVIS WO CONTRAST Result Date: 08/10/2024 CLINICAL DATA:  Abdomen pain body aches EXAM: CT ABDOMEN AND PELVIS WITHOUT CONTRAST TECHNIQUE: Multidetector CT imaging of the abdomen and pelvis was performed following the standard protocol without IV contrast. RADIATION DOSE REDUCTION: This exam was performed according to the departmental dose-optimization program which includes automated exposure control, adjustment of the mA and/or kV according to patient size and/or use of iterative reconstruction technique. COMPARISON:  CT 08/02/2024, 07/01/2023 FINDINGS: Lower chest: No acute abnormality. Hepatobiliary: No focal liver abnormality is seen. Status post cholecystectomy. Prominence of the common bile duct likely due to postsurgical change. This finding is unchanged. Pancreas: Unremarkable. No pancreatic ductal dilatation or surrounding inflammatory changes. Spleen: Normal in size without focal abnormality. Adrenals/Urinary Tract: Adrenal glands are unremarkable. Kidneys are normal, without renal calculi, focal lesion, or hydronephrosis. Bladder is unremarkable. Stomach/Bowel: Stomach within normal limits. No dilated small bowel. No acute bowel wall thickening. Negative  appendix. Large stool burden Vascular/Lymphatic: Aortic atherosclerosis. No enlarged abdominal or pelvic lymph nodes. Reproductive: Prostate is unremarkable. Other: No abdominal wall hernia or abnormality. No abdominopelvic ascites. Musculoskeletal: No acute or significant osseous findings. IMPRESSION: 1. No CT evidence for acute intra-abdominal or pelvic abnormality. 2. Large stool burden. 3. Aortic atherosclerosis. Aortic Atherosclerosis (ICD10-I70.0). Electronically Signed   By: Luke Bun M.D.   On: 08/10/2024 00:39   DG Abdomen 1 View Result Date: 08/09/2024 EXAM: 1 VIEW XRAY OF THE ABDOMEN 08/09/2024 09:56:36 PM COMPARISON: CT abdomen and pelvis from 08/02/2024. CLINICAL HISTORY: Constipation. Pt reports constipation x8 days and generalized body aches. Pt states she was just admitted to the hospital for AMS and UTI, pt was sent home with at home rehab. FINDINGS: BOWEL: Moderate to large colonic stool burden. SOFT TISSUES: Cholecystectomy. No opaque urinary calculi. BONES: No acute osseous abnormality. IMPRESSION: 1. Moderate to large colonic stool burden. Electronically signed by: Norman Gatlin MD 08/09/2024 09:59 PM EDT RP Workstation: [id number]     PROCEDURES:  Critical Care performed: No      Procedures    IMPRESSION / MDM / ASSESSMENT AND PLAN / ED COURSE  I reviewed the triage vital signs and the nursing notes.    Patient here with constipation, abdominal pain and distention, nausea.     DIFFERENTIAL DIAGNOSIS (includes but not limited to):   Constipation, bowel obstruction, ileus, UTI, colitis, viral illness   Patient's presentation is most consistent with acute presentation with potential threat to life or bodily function.   PLAN: Labs from triage show no leukocytosis.  Potassium of 3.1.  Will give oral replacement.  Urine appears infected.  Add on culture and give antibiotics.  X-ray reviewed and interpreted by myself and the radiologist and shows constipation,  no sign of bowel obstruction but given her multiple abdominal surgeries, inability to pass gas, will obtain CT scan to rule out ileus versus bowel obstruction.  She has multiple allergies to pain medication including NSAIDs, Tylenol , narcotics, Bentyl.   MEDICATIONS GIVEN IN ED: Medications  potassium chloride  SA (KLOR-CON  M) CR tablet 40 mEq (40 mEq Oral Given 08/10/24 0007)  lactulose (CHRONULAC) 10 GM/15ML solution 30 g (30 g Oral Given 08/10/24 0124)  sorbitol,  magnesium  hydroxide, mineral oil, glycerin (SMOG) enema (960 mLs Rectal Given 08/10/24 0230)  ondansetron  (ZOFRAN -ODT) disintegrating tablet 4 mg (4 mg Oral Given 08/10/24 0123)  cephALEXin  (KEFLEX ) capsule 500 mg (500 mg Oral Given 08/10/24 0124)  morphine (PF) 4 MG/ML injection 4 mg (4 mg Intramuscular Given 08/10/24 0153)     ED COURSE: Nursing staff unsuccessful in placing peripheral IV.  Will give oral medications.  CT scan shows constipation.  No fecal impaction, bowel obstruction.  Will give lactulose, smog enema and reassess.  Patient requesting something for pain but has 23 allergies.  She states she is able to take morphine.  Discussed with her that this can worsen constipation but will give one-time IM dose here.   3:33 AM  Pt able to have large bowel movement here, feeling better, tolerating p.o.  Will discharge home with prescriptions for Keflex , lactulose to take as needed, Zofran  as needed.  Will have her follow-up with her outpatient doctors.   At this time, I do not feel there is any life-threatening condition present. I reviewed all nursing notes, vitals, pertinent previous records.  All lab and urine results, EKGs, imaging ordered have been independently reviewed and interpreted by myself.  I reviewed all available radiology reports from any imaging ordered this visit.  Based on my assessment, I feel the patient is safe to be discharged home without further emergent workup and can continue workup as an outpatient as  needed. Discussed all findings, treatment plan as well as usual and customary return precautions.  They verbalize understanding and are comfortable with this plan.  Outpatient follow-up has been provided as needed.  All questions have been answered.   CONSULTS:  none   OUTSIDE RECORDS REVIEWED: Reviewed patient's recent hospitalization.       FINAL CLINICAL IMPRESSION(S) / ED DIAGNOSES   Final diagnoses:  Constipation, unspecified constipation type  Acute UTI     Rx / DC Orders   ED Discharge Orders          Ordered    ondansetron  (ZOFRAN -ODT) 4 MG disintegrating tablet  Every 6 hours PRN        08/10/24 0146    cephALEXin  (KEFLEX ) 500 MG capsule  2 times daily        08/10/24 0146    lactulose (CHRONULAC) 10 GM/15ML solution  Daily PRN        08/10/24 0146             Note:  This document was prepared using Dragon voice recognition software and may include unintentional dictation errors.   Jeremian Whitby, Josette SAILOR, DO 08/10/24 [phone]

## 2024-08-10 ENCOUNTER — Other Ambulatory Visit: Payer: Self-pay

## 2024-08-10 ENCOUNTER — Emergency Department

## 2024-08-10 ENCOUNTER — Inpatient Hospital Stay
Admission: EM | Admit: 2024-08-10 | Discharge: 2024-08-15 | DRG: 690 | Disposition: A | Attending: Internal Medicine | Admitting: Internal Medicine

## 2024-08-10 DIAGNOSIS — Z885 Allergy status to narcotic agent status: Secondary | ICD-10-CM

## 2024-08-10 DIAGNOSIS — M797 Fibromyalgia: Secondary | ICD-10-CM | POA: Diagnosis present

## 2024-08-10 DIAGNOSIS — K5909 Other constipation: Secondary | ICD-10-CM | POA: Diagnosis present

## 2024-08-10 DIAGNOSIS — Z91041 Radiographic dye allergy status: Secondary | ICD-10-CM

## 2024-08-10 DIAGNOSIS — R4182 Altered mental status, unspecified: Principal | ICD-10-CM

## 2024-08-10 DIAGNOSIS — E1165 Type 2 diabetes mellitus with hyperglycemia: Secondary | ICD-10-CM | POA: Diagnosis present

## 2024-08-10 DIAGNOSIS — N3000 Acute cystitis without hematuria: Secondary | ICD-10-CM

## 2024-08-10 DIAGNOSIS — Z9109 Other allergy status, other than to drugs and biological substances: Secondary | ICD-10-CM

## 2024-08-10 DIAGNOSIS — I10 Essential (primary) hypertension: Secondary | ICD-10-CM | POA: Diagnosis present

## 2024-08-10 DIAGNOSIS — E118 Type 2 diabetes mellitus with unspecified complications: Secondary | ICD-10-CM | POA: Diagnosis present

## 2024-08-10 DIAGNOSIS — J42 Unspecified chronic bronchitis: Secondary | ICD-10-CM

## 2024-08-10 DIAGNOSIS — F418 Other specified anxiety disorders: Secondary | ICD-10-CM | POA: Diagnosis present

## 2024-08-10 DIAGNOSIS — F419 Anxiety disorder, unspecified: Secondary | ICD-10-CM | POA: Diagnosis present

## 2024-08-10 DIAGNOSIS — Z90710 Acquired absence of both cervix and uterus: Secondary | ICD-10-CM

## 2024-08-10 DIAGNOSIS — Z1152 Encounter for screening for COVID-19: Secondary | ICD-10-CM

## 2024-08-10 DIAGNOSIS — E66811 Obesity, class 1: Secondary | ICD-10-CM | POA: Diagnosis present

## 2024-08-10 DIAGNOSIS — Z7951 Long term (current) use of inhaled steroids: Secondary | ICD-10-CM

## 2024-08-10 DIAGNOSIS — N390 Urinary tract infection, site not specified: Principal | ICD-10-CM | POA: Diagnosis present

## 2024-08-10 DIAGNOSIS — Z794 Long term (current) use of insulin: Secondary | ICD-10-CM

## 2024-08-10 DIAGNOSIS — F17210 Nicotine dependence, cigarettes, uncomplicated: Secondary | ICD-10-CM | POA: Diagnosis present

## 2024-08-10 DIAGNOSIS — K3184 Gastroparesis: Secondary | ICD-10-CM | POA: Diagnosis present

## 2024-08-10 DIAGNOSIS — E860 Dehydration: Secondary | ICD-10-CM | POA: Diagnosis present

## 2024-08-10 DIAGNOSIS — R5383 Other fatigue: Secondary | ICD-10-CM | POA: Diagnosis present

## 2024-08-10 DIAGNOSIS — F3181 Bipolar II disorder: Secondary | ICD-10-CM | POA: Diagnosis present

## 2024-08-10 DIAGNOSIS — N179 Acute kidney failure, unspecified: Secondary | ICD-10-CM | POA: Diagnosis not present

## 2024-08-10 DIAGNOSIS — J449 Chronic obstructive pulmonary disease, unspecified: Secondary | ICD-10-CM | POA: Diagnosis present

## 2024-08-10 DIAGNOSIS — F4310 Post-traumatic stress disorder, unspecified: Secondary | ICD-10-CM | POA: Diagnosis present

## 2024-08-10 DIAGNOSIS — Z8673 Personal history of transient ischemic attack (TIA), and cerebral infarction without residual deficits: Secondary | ICD-10-CM

## 2024-08-10 DIAGNOSIS — Z6833 Body mass index (BMI) 33.0-33.9, adult: Secondary | ICD-10-CM

## 2024-08-10 DIAGNOSIS — E669 Obesity, unspecified: Secondary | ICD-10-CM | POA: Diagnosis present

## 2024-08-10 DIAGNOSIS — Z91014 Allergy to mammalian meats: Secondary | ICD-10-CM

## 2024-08-10 DIAGNOSIS — E876 Hypokalemia: Secondary | ICD-10-CM | POA: Diagnosis present

## 2024-08-10 DIAGNOSIS — Z79899 Other long term (current) drug therapy: Secondary | ICD-10-CM

## 2024-08-10 DIAGNOSIS — E1143 Type 2 diabetes mellitus with diabetic autonomic (poly)neuropathy: Secondary | ICD-10-CM | POA: Diagnosis present

## 2024-08-10 DIAGNOSIS — I69351 Hemiplegia and hemiparesis following cerebral infarction affecting right dominant side: Secondary | ICD-10-CM

## 2024-08-10 DIAGNOSIS — J4489 Other specified chronic obstructive pulmonary disease: Secondary | ICD-10-CM | POA: Diagnosis present

## 2024-08-10 DIAGNOSIS — Z888 Allergy status to other drugs, medicaments and biological substances status: Secondary | ICD-10-CM

## 2024-08-10 DIAGNOSIS — Z9049 Acquired absence of other specified parts of digestive tract: Secondary | ICD-10-CM

## 2024-08-10 DIAGNOSIS — Z7984 Long term (current) use of oral hypoglycemic drugs: Secondary | ICD-10-CM

## 2024-08-10 DIAGNOSIS — T50915A Adverse effect of multiple unspecified drugs, medicaments and biological substances, initial encounter: Secondary | ICD-10-CM | POA: Diagnosis present

## 2024-08-10 DIAGNOSIS — Z886 Allergy status to analgesic agent status: Secondary | ICD-10-CM

## 2024-08-10 LAB — CBC WITH DIFFERENTIAL/PLATELET
Abs Immature Granulocytes: 0.03 K/uL (ref 0.00–0.07)
Basophils Absolute: 0 K/uL (ref 0.0–0.1)
Basophils Relative: 0 %
Eosinophils Absolute: 0.2 K/uL (ref 0.0–0.5)
Eosinophils Relative: 2 %
HCT: 35.1 % — ABNORMAL LOW (ref 36.0–46.0)
Hemoglobin: 11.1 g/dL — ABNORMAL LOW (ref 12.0–15.0)
Immature Granulocytes: 0 %
Lymphocytes Relative: 49 %
Lymphs Abs: 4.4 K/uL — ABNORMAL HIGH (ref 0.7–4.0)
MCH: 29.8 pg (ref 26.0–34.0)
MCHC: 31.6 g/dL (ref 30.0–36.0)
MCV: 94.4 fL (ref 80.0–100.0)
Monocytes Absolute: 0.5 K/uL (ref 0.1–1.0)
Monocytes Relative: 6 %
Neutro Abs: 3.9 K/uL (ref 1.7–7.7)
Neutrophils Relative %: 43 %
Platelets: 221 K/uL (ref 150–400)
RBC: 3.72 MIL/uL — ABNORMAL LOW (ref 3.87–5.11)
RDW: 13.4 % (ref 11.5–15.5)
WBC: 9 K/uL (ref 4.0–10.5)
nRBC: 0 % (ref 0.0–0.2)

## 2024-08-10 LAB — COMPREHENSIVE METABOLIC PANEL WITH GFR
ALT: 19 U/L (ref 0–44)
AST: 27 U/L (ref 15–41)
Albumin: 3.9 g/dL (ref 3.5–5.0)
Alkaline Phosphatase: 67 U/L (ref 38–126)
Anion gap: 13 (ref 5–15)
BUN: 11 mg/dL (ref 6–20)
CO2: 26 mmol/L (ref 22–32)
Calcium: 10 mg/dL (ref 8.9–10.3)
Chloride: 101 mmol/L (ref 98–111)
Creatinine, Ser: 1.22 mg/dL — ABNORMAL HIGH (ref 0.44–1.00)
GFR, Estimated: 52 mL/min — ABNORMAL LOW (ref >60)
Glucose, Bld: 114 mg/dL — ABNORMAL HIGH (ref 70–99)
Potassium: 4.2 mmol/L (ref 3.5–5.1)
Sodium: 140 mmol/L (ref 135–145)
Total Bilirubin: 1 mg/dL (ref 0.0–1.2)
Total Protein: 7.8 g/dL (ref 6.5–8.1)

## 2024-08-10 LAB — URINALYSIS, ROUTINE W REFLEX MICROSCOPIC
Bilirubin Urine: NEGATIVE
Glucose, UA: NEGATIVE mg/dL
Hgb urine dipstick: NEGATIVE
Ketones, ur: NEGATIVE mg/dL
Nitrite: NEGATIVE
Protein, ur: NEGATIVE mg/dL
Specific Gravity, Urine: 1.013 (ref 1.005–1.030)
pH: 5 (ref 5.0–8.0)

## 2024-08-10 LAB — CBG MONITORING, ED: Glucose-Capillary: 111 mg/dL — ABNORMAL HIGH (ref 70–99)

## 2024-08-10 MED ORDER — SMOG ENEMA
960.0000 mL | Freq: Once | RECTAL | Status: AC
  Administered 2024-08-10: 960 mL via RECTAL
  Filled 2024-08-10: qty 960

## 2024-08-10 MED ORDER — SODIUM CHLORIDE 0.9 % IV SOLN
1.0000 g | INTRAVENOUS | Status: DC
  Administered 2024-08-11 – 2024-08-13 (×3): 1 g via INTRAVENOUS
  Filled 2024-08-10 (×4): qty 10

## 2024-08-10 MED ORDER — FENTANYL CITRATE PF 50 MCG/ML IJ SOSY: 50.0000 ug | PREFILLED_SYRINGE | Freq: Once | INTRAMUSCULAR | Status: DC

## 2024-08-10 MED ORDER — ONDANSETRON 4 MG PO TBDP
4.0000 mg | ORAL_TABLET | Freq: Once | ORAL | Status: AC
  Administered 2024-08-10: 4 mg via ORAL
  Filled 2024-08-10: qty 1

## 2024-08-10 MED ORDER — INSULIN ASPART 100 UNIT/ML IJ SOLN: 0.0000 [IU] | Freq: Every day | INTRAMUSCULAR | Status: DC

## 2024-08-10 MED ORDER — CEPHALEXIN 500 MG PO CAPS: 500.0000 mg | ORAL_CAPSULE | Freq: Two times a day (BID) | ORAL | 0 refills | Status: DC

## 2024-08-10 MED ORDER — MORPHINE SULFATE (PF) 4 MG/ML IV SOLN
4.0000 mg | Freq: Once | INTRAVENOUS | Status: AC
  Administered 2024-08-10: 4 mg via INTRAMUSCULAR
  Filled 2024-08-10: qty 1

## 2024-08-10 MED ORDER — NICOTINE 21 MG/24HR TD PT24
21.0000 mg | MEDICATED_PATCH | Freq: Every day | TRANSDERMAL | Status: DC
Start: 2024-08-10 — End: 2024-08-15
  Administered 2024-08-11 – 2024-08-15 (×6): 21 mg via TRANSDERMAL
  Filled 2024-08-10 (×6): qty 1

## 2024-08-10 MED ORDER — ONDANSETRON 4 MG PO TBDP: 4.0000 mg | ORAL_TABLET | Freq: Four times a day (QID) | ORAL | 0 refills | Status: DC | PRN

## 2024-08-10 MED ORDER — HYDRALAZINE HCL 20 MG/ML IJ SOLN
5.0000 mg | INTRAMUSCULAR | Status: DC | PRN
  Filled 2024-08-10: qty 1

## 2024-08-10 MED ORDER — INSULIN ASPART 100 UNIT/ML IJ SOLN
0.0000 [IU] | Freq: Three times a day (TID) | INTRAMUSCULAR | Status: DC
  Administered 2024-08-11 (×3): 2 [IU] via SUBCUTANEOUS
  Administered 2024-08-12: 5 [IU] via SUBCUTANEOUS
  Administered 2024-08-12 – 2024-08-13 (×4): 2 [IU] via SUBCUTANEOUS
  Administered 2024-08-13: 1 [IU] via SUBCUTANEOUS
  Administered 2024-08-14 – 2024-08-15 (×4): 2 [IU] via SUBCUTANEOUS
  Filled 2024-08-10 (×13): qty 1

## 2024-08-10 MED ORDER — ONDANSETRON HCL 4 MG/2ML IJ SOLN
4.0000 mg | Freq: Three times a day (TID) | INTRAMUSCULAR | Status: DC | PRN
  Administered 2024-08-14: 4 mg via INTRAVENOUS
  Filled 2024-08-10: qty 2

## 2024-08-10 MED ORDER — SODIUM CHLORIDE 0.9 % IV BOLUS
1000.0000 mL | Freq: Once | INTRAVENOUS | Status: AC
  Administered 2024-08-10: 1000 mL via INTRAVENOUS

## 2024-08-10 MED ORDER — ENOXAPARIN SODIUM 60 MG/0.6ML IJ SOSY
50.0000 mg | PREFILLED_SYRINGE | INTRAMUSCULAR | Status: DC
  Administered 2024-08-11 – 2024-08-15 (×5): 50 mg via SUBCUTANEOUS
  Filled 2024-08-10 (×5): qty 0.6

## 2024-08-10 MED ORDER — LACTULOSE 10 GM/15ML PO SOLN
30.0000 g | Freq: Once | ORAL | Status: AC
  Administered 2024-08-10: 30 g via ORAL
  Filled 2024-08-10: qty 60

## 2024-08-10 MED ORDER — LACTULOSE 10 GM/15ML PO SOLN: 30.0000 g | Freq: Every day | ORAL | 0 refills | Status: AC | PRN

## 2024-08-10 MED ORDER — CEFTRIAXONE SODIUM 1 G IJ SOLR
1.0000 g | Freq: Once | INTRAMUSCULAR | Status: AC
  Administered 2024-08-10: 1 g via INTRAVENOUS
  Filled 2024-08-10: qty 10

## 2024-08-10 MED ORDER — CEPHALEXIN 500 MG PO CAPS
500.0000 mg | ORAL_CAPSULE | Freq: Once | ORAL | Status: AC
  Administered 2024-08-10: 500 mg via ORAL
  Filled 2024-08-10: qty 1

## 2024-08-10 MED ORDER — ALBUTEROL SULFATE (2.5 MG/3ML) 0.083% IN NEBU: 2.5000 mg | INHALATION_SOLUTION | RESPIRATORY_TRACT | Status: DC | PRN

## 2024-08-10 MED ORDER — DM-GUAIFENESIN ER 30-600 MG PO TB12: 1.0000 | ORAL_TABLET | Freq: Two times a day (BID) | ORAL | Status: DC | PRN

## 2024-08-10 NOTE — Progress Notes (Addendum)
 Responded to consult for IV. Spoke with nurse regarding access needs. Per RN, all medication routes are PO and IV not needed at this time. VAST available for assistance with any future access needs.

## 2024-08-10 NOTE — Progress Notes (Signed)
 PHARMACIST - PHYSICIAN COMMUNICATION  CONCERNING:  Enoxaparin  (Lovenox ) for DVT Prophylaxis    RECOMMENDATION: Patient was prescribed enoxaprin 40mg  q24 hours for VTE prophylaxis.   There were no vitals filed for this visit.  There is no height or weight on file to calculate BMI.  Estimated Creatinine Clearance: 61.6 mL/min (A) (by C-G formula based on SCr of 1.22 mg/dL (H)).   Based on [facility] policy patient is candidate for enoxaparin  0.5mg /kg TBW SQ every 24 hours based on BMI being >30.  DESCRIPTION: Pharmacy has adjusted enoxaparin  dose per [facility] policy.  Patient is now receiving enoxaparin  0.5 mg/kg every 24 hours   Rankin CANDIE Dills, PharmD, [facility] 08/10/2024 11:44 PM

## 2024-08-10 NOTE — ED Notes (Signed)
 Called lab to send phlebotomist to room to obtain blood work.

## 2024-08-10 NOTE — H&P (Incomplete)
 History and Physical    Alicia Mayo FMW:[id number] DOB: 11-29-1965 DOA: 08/10/2024  Referring MD/NP/PA:   PCP: Dante Elsie Celestia MADISON, MD   Patient coming from:  The patient is coming from home.     Chief Complaint: lethargy and suprapubic abdominal pain  HPI: Briannie Gutierrez Mcreynolds is a 58 y.o. female with medical history significant of bipolar, depression with anxiety, PTSD, multiple stroke with right-sided weakness, HTN, DM, COPD/asthma, obesity, gastroparesis, fibromyalgia on Subutex , UTI, who presents with lethargy, suprapubic abdominal pain.   Pt is from home.     Pt BIB EMS from home with concerns from family that pt was to sleepy. Per EMS pt was seen in the the ED and dx with a UTI. Per Ems family was concerned that pt would not wake up enough talk or take her medications. Pt is snoring during triage. Pt asked by this RN if she knew why she was her she shook her head no. Pt was asked if she knew why family was concerned. Pt again shook her head no.                Data reviewed independently and ED Course: pt was found to have     ***       EKG: I have personally reviewed.  Not done in ED, will get one.   ***   Review of Systems:   General: no fevers, chills, no body weight gain, has poor appetite, has fatigue HEENT: no blurry vision, hearing changes or sore throat Respiratory: no dyspnea, coughing, wheezing CV: no chest pain, no palpitations GI: no nausea, vomiting, abdominal pain, diarrhea, constipation GU: no dysuria, burning on urination, increased urinary frequency, hematuria  Ext: no leg edema Neuro: no unilateral weakness, numbness, or tingling, no vision change or hearing loss Skin: no rash, no skin tear. MSK: No muscle spasm, no deformity, no limitation of range of movement in spin Heme: No easy bruising.  Travel history: No recent long distant travel.   Allergy:  Allergies  Allergen Reactions  . Celecoxib Other (See Comments) and Swelling     Other Reaction: OTHER REACTION = SWELLING  . Gabapentin Rash and Swelling    Pt says she gets face and throat swelling.  . Levetiracetam Rash  . Oxycodone-Acetaminophen  Itching, Nausea Only and Swelling    Pt states that nausea was the most significant effect. Pt states that percocet is tolerable when given ondansetron  (ZOFRAN ).  . Quetiapine Other (See Comments) and Rash    Increased blood sugars.  . Shellfish Allergy Swelling    Scallops, shellfish => swelling  . Soap Swelling    [place] spring soap  . Acetaminophen  Nausea And Vomiting  . Alprazolam Other (See Comments)    Memory loss  . Atorvastatin     Other reaction(s): Muscle Pain  . Diazepam Other (See Comments)    Memory loss  . Dicyclomine Other (See Comments)    Over-sedation  . Ibuprofen Nausea And Vomiting    Other Reaction: TONGUE SWELING/BLEEDING  . Ivp Dye [Iodinated Contrast Media] Itching  . Oxycodone Itching and Swelling    Swelling of tongue  . Propoxyphene     With tylenol   . Sertraline Other (See Comments)    Other reaction(s): Unknown unknown  . Trazodone Other (See Comments)    Over-sedation  . Acetaminophen -Codeine Itching and Nausea Only  . Aspirin Nausea Only and Swelling    Sinus swelling   . Codeine Itching and Nausea Only  No associated rash. Itching stops on its own after a few days, but faster when given diphenhydramine.  . Tramadol Swelling    Facial swelling  . Zolpidem Nausea Only and Other (See Comments)    Causes memory loss    Past Medical History:  Diagnosis Date  . Asthma   . COPD (chronic obstructive pulmonary disease) (HCC)   . Diabetes mellitus without complication (HCC)   . Fibromyalgia   . Hypertension   . Stroke [facility]) 2023   2023, 2008 and others. 8 strokes. R hemiparesis    Past Surgical History:  Procedure Laterality Date  . APPENDECTOMY    . CHOLECYSTECTOMY      Social History:  reports that she has been smoking cigarettes. She has never used smokeless  tobacco. She reports that she does not currently use alcohol. She reports that she does not use drugs.  Family History: No family history on file.   Prior to Admission medications   Medication Sig Start Date End Date Taking? Authorizing Provider  albuterol (VENTOLIN HFA) 108 (90 Base) MCG/ACT inhaler Inhale 2 puffs into the lungs every 4 (four) hours as needed.    [provider]  ammonium lactate (AMLACTIN) 12 % cream Apply 1 Application topically as needed. 07/03/24   [provider]  benztropine  (COGENTIN ) 1 MG tablet Take 0.5 tablets (0.5 mg total) by mouth 2 (two) times daily as needed for tremors. 08/07/24   Dino Antu, MD  buprenorphine  (SUBUTEX ) 2 MG SUBL SL tablet Place 2 mg under the tongue in the morning, at noon, and at bedtime.    [provider]  busPIRone (BUSPAR) 15 MG tablet Take 15 mg by mouth 3 (three) times daily.    [provider]  cephALEXin  (KEFLEX ) 500 MG capsule Take 1 capsule (500 mg total) by mouth 2 (two) times daily. 08/10/24   Ward, Josette SAILOR, DO  escitalopram  (LEXAPRO ) 20 MG tablet Take 20 mg by mouth daily.    [provider]  fluticasone-salmeterol (ADVAIR) 250-50 MCG/ACT AEPB Inhale 1 puff into the lungs 2 (two) times daily.    [provider]  Insulin  Aspart FlexPen (NOVOLOG ) 100 UNIT/ML Inject 100 Units into the muscle See admin instructions. 07/28/24   [provider]  INVEGA  SUSTENNA 156 MG/ML SUSY injection Inject 156 mg into the muscle every 30 (thirty) days. 06/15/24   [provider]  lactulose (CHRONULAC) 10 GM/15ML solution Take 45 mLs (30 g total) by mouth daily as needed for moderate constipation or severe constipation. 08/10/24   Ward, Josette SAILOR, DO  lidocaine  (LIDODERM ) 5 % Place 1 patch onto the skin daily.    [provider]  LINZESS 290 MCG CAPS capsule Take 290 mcg by mouth daily.    [provider]  losartan  (COZAAR ) 100 MG tablet Take 100 mg by mouth daily.     [provider]  Lurasidone  HCl 120 MG TABS Take 1 tablet by mouth daily. 07/27/24   [provider]  metFORMIN (GLUCOPHAGE-XR) 500 MG 24 hr tablet Take 1,000 mg by mouth 2 (two) times daily.    [provider]  metoprolol tartrate (LOPRESSOR) 50 MG tablet Take 50 mg by mouth 2 (two) times daily.    [provider]  ondansetron  (ZOFRAN -ODT) 4 MG disintegrating tablet Take 1 tablet (4 mg total) by mouth every 6 (six) hours as needed for nausea or vomiting. 08/10/24   Ward, Josette SAILOR, DO  paliperidone  (INVEGA ) 3 MG 24 hr tablet Take 2 tablets (  6 mg total) by mouth at bedtime. 08/07/24   Rashid, Farhan, MD  pantoprazole  (PROTONIX ) 40 MG tablet Take 40 mg by mouth daily.    [provider]  polyethylene glycol (MIRALAX / GLYCOLAX) 17 g packet Take 17 g by mouth daily as needed. 01/14/24   [provider]  pregabalin  (LYRICA ) 150 MG capsule Take 1 capsule (150 mg total) by mouth 2 (two) times daily. 08/07/24   Dino Antu, MD  rosuvastatin  (CRESTOR ) 5 MG tablet Take 5 mg by mouth daily.    [provider]  trospium (SANCTURA) 20 MG tablet Take 20 mg by mouth 2 (two) times daily.    [provider]    Physical Exam: Vitals:   08/10/24 1654 08/10/24 2103 08/10/24 2200 08/10/24 2300  BP: (!) 143/72 101/67 132/74 (!) 166/92  Pulse: 65 68 64 73  Resp: 20 16 12 13   Temp: 98.4 F (36.9 C) 98.2 F (36.8 C)    TempSrc: Oral Oral    SpO2: 99% 98% 100% 99%   General: Not in acute distress HEENT:       Eyes: PERRL, EOMI, no jaundice       ENT: No discharge from the ears and nose, no pharynx injection, no tonsillar enlargement.        Neck: No JVD, no bruit, no mass felt. Heme: No neck lymph node enlargement. Cardiac: S1/S2, RRR, No murmurs, No gallops or rubs. Respiratory: No rales, wheezing, rhonchi or rubs. GI: Soft, nondistended, nontender, no rebound pain, no organomegaly, BS present. GU: No hematuria Ext: No pitting leg  edema bilaterally. 1+DP/PT pulse bilaterally. Musculoskeletal: No joint deformities, No joint redness or warmth, no limitation of ROM in spin. Skin: No rashes.  Neuro: Alert, oriented X3, cranial nerves II-XII grossly intact, moves all extremities normally. Muscle strength 5/5 in all extremities, sensation to light touch intact. Brachial reflex 2+ bilaterally. Knee reflex 1+ bilaterally. Negative Babinski's sign. Normal finger to nose test. Psych: Patient is not psychotic, no suicidal or hemocidal ideation.  Labs on Admission: I have personally reviewed following labs and imaging studies  CBC: Recent Labs  Lab 08/09/24 2113 08/10/24 2018  WBC 8.7 9.0  NEUTROABS 4.6 3.9  HGB 11.1* 11.1*  HCT 34.1* 35.1*  MCV 93.2 94.4  PLT 213 221   Basic Metabolic Panel: Recent Labs  Lab 08/04/24 1258 08/09/24 2113 08/10/24 1730  NA 141 142 140  K 3.1* 3.1* 4.2  CL 100 101 101  CO2 28 30 26   GLUCOSE 219* 158* 114*  BUN 8 12 11   CREATININE 0.77 0.93 1.22*  CALCIUM  9.3 9.8 10.0   GFR: Estimated Creatinine Clearance: 61.6 mL/min (A) (by C-G formula based on SCr of 1.22 mg/dL (H)). Liver Function Tests: Recent Labs  Lab 08/10/24 1730  AST 27  ALT 19  ALKPHOS 67  BILITOT 1.0  PROT 7.8  ALBUMIN 3.9   No results for input(s): LIPASE, AMYLASE in the last 168 hours. No results for input(s): AMMONIA in the last 168 hours. Coagulation Profile: No results for input(s): INR, PROTIME in the last 168 hours. Cardiac Enzymes: No results for input(s): CKTOTAL, CKMB, CKMBINDEX, TROPONINI in the last 168 hours. BNP (last 3 results) No results for input(s): PROBNP in the last 8760 hours. HbA1C: No results for input(s): HGBA1C in the last 72 hours. CBG: Recent Labs  Lab 08/06/24 1141 08/06/24 1954 08/07/24 0754 08/07/24 1157 08/10/24 1655  GLUCAP 156* 178* 121* 155* 111*   Lipid Profile: No results for input(s):  CHOL, HDL, LDLCALC, TRIG, CHOLHDL,  LDLDIRECT in the last 72 hours. Thyroid Function Tests: No results for input(s): TSH, T4TOTAL, FREET4, T3FREE, THYROIDAB in the last 72 hours. Anemia Panel: No results for input(s): VITAMINB12, FOLATE, FERRITIN, TIBC, IRON, RETICCTPCT in the last 72 hours. Urine analysis:    Component Value Date/Time   COLORURINE YELLOW (A) 08/10/2024 2236   APPEARANCEUR HAZY (A) 08/10/2024 2236   LABSPEC 1.013 08/10/2024 2236   PHURINE 5.0 08/10/2024 2236   GLUCOSEU NEGATIVE 08/10/2024 2236   HGBUR NEGATIVE 08/10/2024 2236   BILIRUBINUR NEGATIVE 08/10/2024 2236   KETONESUR NEGATIVE 08/10/2024 2236   PROTEINUR NEGATIVE 08/10/2024 2236   NITRITE NEGATIVE 08/10/2024 2236   LEUKOCYTESUR LARGE (A) 08/10/2024 2236   Sepsis Labs: @LABRCNTIP (procalcitonin:4,lacticidven:4) ) Recent Results (from the past 240 hours)  Urine Culture (for pregnant, neutropenic or urologic patients or patients with an indwelling urinary catheter)     Status: Abnormal   Collection Time: 08/02/24  9:17 PM   Specimen: Urine, Clean Catch  Result Value Ref Range Status   Specimen Description   Final    URINE, CLEAN CATCH Performed at [facility], [address] [place], [place] [address]    Special Requests   Final    NONE Performed at [facility], [address], [place], [place] [address]    Culture (A)  Final    >=100,000 COLONIES/mL STAPHYLOCOCCUS EPIDERMIDIS CORRECTED RESULTS PREVIOUSLY REPORTED AS: GRAM NEGATIVE RODS CORRECTED RESULTS CALLED TO: EUSTACE JEST RN, AT 1250 08/04/24 D. VANHOOK Performed at [facility], [address] [place], [place] [address]    Report Status 08/05/2024 FINAL  Final   Organism ID, Bacteria STAPHYLOCOCCUS EPIDERMIDIS (A)  Final      Susceptibility   Staphylococcus epidermidis - MIC*    CIPROFLOXACIN <=0.5 SENSITIVE Sensitive     GENTAMICIN <=0.5 SENSITIVE Sensitive     NITROFURANTOIN <=16 SENSITIVE Sensitive     OXACILLIN <=0.25  SENSITIVE Sensitive     TETRACYCLINE 2 SENSITIVE Sensitive     VANCOMYCIN 1 SENSITIVE Sensitive     TRIMETH/SULFA <=10 SENSITIVE Sensitive     RIFAMPIN <=0.5 SENSITIVE Sensitive     Inducible Clindamycin NEGATIVE Sensitive     * >=100,000 COLONIES/mL STAPHYLOCOCCUS EPIDERMIDIS  Blood culture (routine x 2)     Status: Abnormal   Collection Time: 08/03/24 12:05 AM   Specimen: BLOOD RIGHT FOREARM  Result Value Ref Range Status   Specimen Description   Final    BLOOD RIGHT FOREARM Performed at [facility], [address] [place], [place] [address]    Special Requests   Final    BOTTLES DRAWN AEROBIC ONLY Blood Culture adequate volume Performed at [facility], [address] [place], [place] [address]    Culture  Setup Time   Final    GRAM POSITIVE COCCI AEROBIC BOTTLE ONLY CRITICAL RESULT CALLED TO, READ BACK BY AND VERIFIED WITH: NATHAN PARKS @1950  ON 08/03/24 SKL    Culture (A)  Final    STAPHYLOCOCCUS HOMINIS THE SIGNIFICANCE OF ISOLATING THIS ORGANISM FROM A SINGLE SET OF BLOOD CULTURES WHEN MULTIPLE SETS ARE DRAWN IS UNCERTAIN. PLEASE NOTIFY THE MICROBIOLOGY DEPARTMENT WITHIN ONE WEEK IF SPECIATION AND SENSITIVITIES ARE REQUIRED. Performed at [facility], [address] [place], [place] [address]    Report Status 08/06/2024 FINAL  Final  Blood Culture ID Panel (Reflexed)     Status: Abnormal   Collection Time: 08/03/24 12:05 AM  Result Value Ref  Range Status   Enterococcus faecalis NOT DETECTED NOT DETECTED Final   Enterococcus Faecium NOT DETECTED NOT DETECTED Final   Listeria monocytogenes NOT DETECTED NOT DETECTED Final   Staphylococcus species DETECTED (A) NOT DETECTED Final    Comment: CRITICAL RESULT CALLED TO, READ BACK BY AND VERIFIED WITH: NATHAN PARKS @1950  ON 08/03/24 SKL    Staphylococcus aureus (BCID) NOT DETECTED NOT DETECTED Final   Staphylococcus epidermidis NOT DETECTED NOT DETECTED Final   Staphylococcus lugdunensis NOT DETECTED  NOT DETECTED Final   Streptococcus species NOT DETECTED NOT DETECTED Final   Streptococcus agalactiae NOT DETECTED NOT DETECTED Final   Streptococcus pneumoniae NOT DETECTED NOT DETECTED Final   Streptococcus pyogenes NOT DETECTED NOT DETECTED Final   A.calcoaceticus-baumannii NOT DETECTED NOT DETECTED Final   Bacteroides fragilis NOT DETECTED NOT DETECTED Final   Enterobacterales NOT DETECTED NOT DETECTED Final   Enterobacter cloacae complex NOT DETECTED NOT DETECTED Final   Escherichia coli NOT DETECTED NOT DETECTED Final   Klebsiella aerogenes NOT DETECTED NOT DETECTED Final   Klebsiella oxytoca NOT DETECTED NOT DETECTED Final   Klebsiella pneumoniae NOT DETECTED NOT DETECTED Final   Proteus species NOT DETECTED NOT DETECTED Final   Salmonella species NOT DETECTED NOT DETECTED Final   Serratia marcescens NOT DETECTED NOT DETECTED Final   Haemophilus influenzae NOT DETECTED NOT DETECTED Final   Neisseria meningitidis NOT DETECTED NOT DETECTED Final   Pseudomonas aeruginosa NOT DETECTED NOT DETECTED Final   Stenotrophomonas maltophilia NOT DETECTED NOT DETECTED Final   Candida albicans NOT DETECTED NOT DETECTED Final   Candida auris NOT DETECTED NOT DETECTED Final   Candida glabrata NOT DETECTED NOT DETECTED Final   Candida krusei NOT DETECTED NOT DETECTED Final   Candida parapsilosis NOT DETECTED NOT DETECTED Final   Candida tropicalis NOT DETECTED NOT DETECTED Final   Cryptococcus neoformans/gattii NOT DETECTED NOT DETECTED Final    Comment: Performed at [facility], [address] [place], [place] [address]  Blood culture (routine x 2)     Status: None   Collection Time: 08/03/24 12:19 AM   Specimen: BLOOD LEFT HAND  Result Value Ref Range Status   Specimen Description BLOOD LEFT HAND  Final   Special Requests   Final    BOTTLES DRAWN AEROBIC ONLY Blood Culture results may not be optimal due to an inadequate volume of blood received in culture bottles   Culture    Final    NO GROWTH 5 DAYS Performed at [facility], [address] [place], [place] [address]    Report Status 08/08/2024 FINAL  Final  Resp panel by RT-PCR (RSV, Flu A&B, Covid) Anterior Nasal Swab     Status: None   Collection Time: 08/09/24  9:14 PM   Specimen: Anterior Nasal Swab  Result Value Ref Range Status   SARS Coronavirus 2 by RT PCR NEGATIVE NEGATIVE Final    Comment: (NOTE) SARS-CoV-2 target nucleic acids are NOT DETECTED.  The SARS-CoV-2 RNA is generally detectable in upper respiratory specimens during the acute phase of infection. The lowest concentration of SARS-CoV-2 viral copies this assay can detect is 138 copies/mL. A negative result does not preclude SARS-Cov-2 infection and should not be used as the sole basis for treatment or other patient management decisions. A negative result may occur with  improper specimen collection/handling, submission of specimen other than nasopharyngeal swab, presence of viral mutation(s) within the areas targeted by this assay, and inadequate number of viral copies(<138 copies/mL). A negative result must be  combined with clinical observations, patient history, and epidemiological information. The expected result is Negative.  Fact Sheet for Patients:  BloggerCourse.com  Fact Sheet for Healthcare Providers:  SeriousBroker.it  This test is no t yet approved or cleared by the [place]  FDA and  has been authorized for detection and/or diagnosis of SARS-CoV-2 by FDA under an Emergency Use Authorization (EUA). This EUA will remain  in effect (meaning this test can be used) for the duration of the COVID-19 declaration under Section 564(b)(1) of the Act, 21 U.S.C.section 360bbb-3(b)(1), unless the authorization is terminated  or revoked sooner.       Influenza A by PCR NEGATIVE NEGATIVE Final   Influenza B by PCR NEGATIVE NEGATIVE Final    Comment: (NOTE) The  Xpert Xpress SARS-CoV-2/FLU/RSV plus assay is intended as an aid in the diagnosis of influenza from Nasopharyngeal swab specimens and should not be used as a sole basis for treatment. Nasal washings and aspirates are unacceptable for Xpert Xpress SARS-CoV-2/FLU/RSV testing.  Fact Sheet for Patients: BloggerCourse.com  Fact Sheet for Healthcare Providers: SeriousBroker.it  This test is not yet approved or cleared by the [place]  FDA and has been authorized for detection and/or diagnosis of SARS-CoV-2 by FDA under an Emergency Use Authorization (EUA). This EUA will remain in effect (meaning this test can be used) for the duration of the COVID-19 declaration under Section 564(b)(1) of the Act, 21 U.S.C. section 360bbb-3(b)(1), unless the authorization is terminated or revoked.     Resp Syncytial Virus by PCR NEGATIVE NEGATIVE Final    Comment: (NOTE) Fact Sheet for Patients: BloggerCourse.com  Fact Sheet for Healthcare Providers: SeriousBroker.it  This test is not yet approved or cleared by the [place]  FDA and has been authorized for detection and/or diagnosis of SARS-CoV-2 by FDA under an Emergency Use Authorization (EUA). This EUA will remain in effect (meaning this test can be used) for the duration of the COVID-19 declaration under Section 564(b)(1) of the Act, 21 U.S.C. section 360bbb-3(b)(1), unless the authorization is terminated or revoked.  Performed at [facility], [address] [place], [place] [address]      Radiological Exams on Admission:   Assessment/Plan Principal Problem:   UTI (urinary tract infection) Active Problems:   Lethargy   AKI (acute kidney injury)   Diabetes mellitus with complication of gastroparesis   COPD (chronic obstructive pulmonary disease) (HCC)   History of stroke   Fibromyalgia   Bipolar II disorder (HCC)    Depression with anxiety   Essential hypertension   Assessment and Plan: No notes have been filed under this hospital service. Service: Hospitalist      Principal Problem:   UTI (urinary tract infection) Active Problems:   Lethargy   AKI (acute kidney injury)   Diabetes mellitus with complication of gastroparesis   COPD (chronic obstructive pulmonary disease) (HCC)   History of stroke   Fibromyalgia   Bipolar II disorder (HCC)   Depression with anxiety   Essential hypertension    DVT ppx: SQ Heparin         SQ Lovenox   Code Status: Full code   ***  Family Communication:     not done, no family member is at bed side.              Yes, patient's    at bed side.       by phone   ***  Disposition Plan:  Anticipate discharge back to previous environment  Consults called:  Admission status and Level of care: Med-Surg:    for obs as inpt        Dispo: The patient is from: {From:23814}              Anticipated d/c is to: {To:23815}              Anticipated d/c date is: {Days:23816}              Patient currently {Medically stable:23817}    Severity of Illness:  {Observation/Inpatient:21159}       Date of Service 08/10/2024    Caleb Exon [facility]   If 7PM-7AM, please contact night-coverage [email or web address] 08/10/2024, 11:58 PM

## 2024-08-10 NOTE — ED Notes (Signed)
 RN attempted to gain Labs' RN able to get some blood in lt Strong tube. Not able to get purple tube. RN called lab previously to come collect.

## 2024-08-10 NOTE — ED Provider Notes (Signed)
 [facility] Provider Note    Event Date/Time   First MD Initiated Contact with Patient 08/10/24 1647     (approximate)   History   Medical Evaluation   HPI {Remember to add pertinent medical, surgical, social, and/or OB history to HPI:1} Alicia Mayo is a 58 y.o. female  ***       Physical Exam   Triage Vital Signs: ED Triage Vitals  Encounter Vitals Group     BP 08/10/24 1654 (!) 143/72     Girls Systolic BP Percentile --      Girls Diastolic BP Percentile --      Boys Systolic BP Percentile --      Boys Diastolic BP Percentile --      Pulse Rate 08/10/24 1654 65     Resp 08/10/24 1654 20     Temp 08/10/24 1654 98.4 F (36.9 C)     Temp Source 08/10/24 1654 Oral     SpO2 08/10/24 1654 98 %     Weight --      Height --      Head Circumference --      Peak Flow --      Pain Score --      Pain Loc --      Pain Education --      Exclude from Growth Chart --     Most recent vital signs: Vitals:   08/10/24 1654 08/10/24 1654  BP:  (!) 143/72  Pulse:  65  Resp:  20  Temp:  98.4 F (36.9 C)  SpO2: 98% 99%    {Only need to document appropriate and relevant physical exam:1} General: Awake, no distress. *** CV:  Good peripheral perfusion. *** Resp:  Normal effort. *** Abd:  No distention. *** Other:  ***   ED Results / Procedures / Treatments   Labs (all labs ordered are listed, but only abnormal results are displayed) Labs Reviewed  COMPREHENSIVE METABOLIC PANEL WITH GFR - Abnormal; Notable for the following components:      Result Value   Glucose, Bld 114 (*)    Creatinine, Ser 1.22 (*)    GFR, Estimated 52 (*)    All other components within normal limits  CBG MONITORING, ED - Abnormal; Notable for the following components:   Glucose-Capillary 111 (*)    All other components within normal limits  CBC WITH DIFFERENTIAL/PLATELET  URINALYSIS, ROUTINE W REFLEX MICROSCOPIC  CBC WITH DIFFERENTIAL/PLATELET     EKG  I,  Guadalupe Eagles, attending physician, personally viewed and interpreted this EKG  EKG Time: 1650 Rate: 59 Rhythm: sinus bradycardia Axis: normal Intervals: qtc 454 QRS: narrow ST changes: no st elevation Impression: normal ekg    RADIOLOGY I independently interpreted and visualized the CT head. My interpretation: No [facility] Radiology interpretation:  IMPRESSION:  1. No acute intracranial abnormality.     PROCEDURES:  Critical Care performed: Yes  CRITICAL CARE Performed by: Guadalupe Eagles   Total critical care time: *** minutes  Critical care time was exclusive of separately billable procedures and treating other patients.  Critical care was necessary to treat or prevent imminent or life-threatening deterioration.  Critical care was time spent personally by me on the following activities: development of treatment plan with patient and/or surrogate as well as nursing, discussions with consultants, evaluation of patient's response to treatment, examination of patient, obtaining history from patient or surrogate, ordering and performing treatments and interventions, ordering and review of laboratory studies, ordering  and review of radiographic studies, pulse oximetry and re-evaluation of patient's condition.   Procedures    MEDICATIONS ORDERED IN ED: Medications - No data to display   IMPRESSION / MDM / ASSESSMENT AND PLAN / ED COURSE  I reviewed the triage vital signs and the nursing notes.                              Differential diagnosis includes, but is not limited to, ***  Patient's presentation is most consistent with {EM COPA:27473}   ***The patient is on the cardiac monitor to evaluate for evidence of arrhythmia and/or significant heart rate changes.  ***      FINAL CLINICAL IMPRESSION(S) / ED DIAGNOSES   Final diagnoses:  None        Rx / DC Orders   ED Discharge Orders     None        Note:  This document was prepared using  Dragon voice recognition software and may include unintentional dictation errors.

## 2024-08-10 NOTE — H&P (Signed)
 History and Physical    [name] FMW:[id number] DOB: September 20, 1966 DOA: 08/10/2024  Referring MD/NP/PA:   PCP: Dante Elsie Celestia MADISON, MD   Patient coming from:  The patient is coming from home.     Chief Complaint: lethargy and suprapubic abdominal pain  HPI: Alicia Mayo is a 58 y.o. female with medical history significant of bipolar, depression with anxiety, PTSD, multiple stroke with right-sided weakness, HTN, DM, COPD/asthma, obesity, gastroparesis, fibromyalgia on Subutex , UTI, who presents with lethargy, suprapubic abdominal pain.  Patient was recently hospitalized from 9/24 - 9/29 due to altered mental status and UTI.  Today pt is from home. Per Ems family was concerned that pt would not wake up enough to talk or take her medications. Pt is very sleepy. When I saw pt in ED, pt is lethargic, but easily arousable, oriented x 3, answered all questions appropriately.  She states she had 8 strokes in the past with mild right-sided weakness which has not changed.  Denies chest pain, cough, SOB.  She has nausea, no vomiting or diarrhea.  She states she has mild suprapubic abdominal pain.  Denies dysuria or burning on urination.  No hematuria.  No fever or chills.  Of note, patient is currently taking Keflex  for UTI.   Data reviewed independently and ED Course: pt was found to have WBC 9.0, AKI, UA (hazy appearance, large amount of leukocyte, rare bacteria, WBC 11-20),.  Temperature normal, blood pressure 101/67, heart rate 68, RR 16, oxygen saturation 98% on room air.  CT of head negative for acute intracranial abnormalities.  CT of abdomen/pelvis negative for acute intraabdominal issue.  Patient is placed in MedSurg bed for observation.   EKG: I have personally reviewed.  Sinus rhythm, QTc 454, nonspecific T wave change.   Review of Systems:   General: no fevers, chills, no body weight gain, has poor appetite, has fatigue HEENT: no blurry vision, hearing changes or sore  throat Respiratory: no dyspnea, coughing, wheezing CV: no chest pain, no palpitations GI: Has nausea and suprapubic abdominal pain, no vomiting or diarrhea. GU: no dysuria, burning on urination, increased urinary frequency, hematuria  Ext: no leg edema Neuro: no vision change or hearing loss.  Has lethargy and chronic mild right-sided weakness Skin: no rash, no skin tear. MSK: No muscle spasm, no deformity, no limitation of range of movement in spin Heme: No easy bruising.  Travel history: No recent long distant travel.   Allergy:  Allergies  Allergen Reactions   Celecoxib Other (See Comments) and Swelling    Other Reaction: OTHER REACTION = SWELLING   Gabapentin Rash and Swelling    Pt says she gets face and throat swelling.   Levetiracetam Rash   Oxycodone-Acetaminophen  Itching, Nausea Only and Swelling    Pt states that nausea was the most significant effect. Pt states that percocet is tolerable when given ondansetron  (ZOFRAN ).   Quetiapine Other (See Comments) and Rash    Increased blood sugars.   Shellfish Allergy Swelling    Scallops, shellfish => swelling   Soap Swelling    [place] soap   Acetaminophen  Nausea And Vomiting   Alprazolam Other (See Comments)    Memory loss   Atorvastatin     Other reaction(s): Muscle Pain   Diazepam Other (See Comments)    Memory loss   Dicyclomine Other (See Comments)    Over-sedation   Ibuprofen Nausea And Vomiting    Other Reaction: TONGUE SWELING/BLEEDING   Ivp Dye [Iodinated Contrast Media]  Itching   Oxycodone Itching and Swelling    Swelling of tongue   Propoxyphene     With tylenol    Sertraline Other (See Comments)    Other reaction(s): Unknown unknown   Trazodone Other (See Comments)    Over-sedation   Acetaminophen -Codeine Itching and Nausea Only   Aspirin Nausea Only and Swelling    Sinus swelling    Codeine Itching and Nausea Only    No associated rash. Itching stops on its own after a few days, but faster  when given diphenhydramine.   Tramadol Swelling    Facial swelling   Zolpidem Nausea Only and Other (See Comments)    Causes memory loss    Past Medical History:  Diagnosis Date   Asthma    COPD (chronic obstructive pulmonary disease) (HCC)    Diabetes mellitus without complication (HCC)    Fibromyalgia    Hypertension    Stroke (HCC) 2023   2023, 2008 and others. 8 strokes. R hemiparesis    Past Surgical History:  Procedure Laterality Date   APPENDECTOMY     CHOLECYSTECTOMY      Social History:  reports that she has been smoking cigarettes. She has never used smokeless tobacco. She reports that she does not currently use alcohol. She reports that she does not use drugs.  Family History: No family history on file.   Prior to Admission medications   Medication Sig Start Date End Date Taking? Authorizing Provider  albuterol (VENTOLIN HFA) 108 (90 Base) MCG/ACT inhaler Inhale 2 puffs into the lungs every 4 (four) hours as needed.    [provider]  ammonium lactate (AMLACTIN) 12 % cream Apply 1 Application topically as needed. 07/03/24   [provider]  benztropine  (COGENTIN ) 1 MG tablet Take 0.5 tablets (0.5 mg total) by mouth 2 (two) times daily as needed for tremors. 08/07/24   Dino Antu, MD  buprenorphine  (SUBUTEX ) 2 MG SUBL SL tablet Place 2 mg under the tongue in the morning, at noon, and at bedtime.    [provider]  busPIRone (BUSPAR) 15 MG tablet Take 15 mg by mouth 3 (three) times daily.    [provider]  cephALEXin  (KEFLEX ) 500 MG capsule Take 1 capsule (500 mg total) by mouth 2 (two) times daily. 08/10/24   Ward, Josette SAILOR, DO  escitalopram  (LEXAPRO ) 20 MG tablet Take 20 mg by mouth daily.    [provider]  fluticasone-salmeterol (ADVAIR) 250-50 MCG/ACT AEPB Inhale 1 puff into the lungs 2 (two) times daily.    [provider]  Insulin  Aspart FlexPen (NOVOLOG ) 100 UNIT/ML Inject 100 Units into the muscle  See admin instructions. 07/28/24   [provider]  INVEGA  SUSTENNA 156 MG/ML SUSY injection Inject 156 mg into the muscle every 30 (thirty) days. 06/15/24   [provider]  lactulose (CHRONULAC) 10 GM/15ML solution Take 45 mLs (30 g total) by mouth daily as needed for moderate constipation or severe constipation. 08/10/24   Ward, Josette SAILOR, DO  lidocaine  (LIDODERM ) 5 % Place 1 patch onto the skin daily.    [provider]  LINZESS 290 MCG CAPS capsule Take 290 mcg by mouth daily.    [provider]  losartan  (COZAAR ) 100 MG tablet Take 100 mg by mouth daily.    [provider]  Lurasidone  HCl 120 MG TABS Take 1 tablet by mouth daily. 07/27/24   [provider]  metFORMIN (GLUCOPHAGE-XR) 500 MG 24 hr tablet Take 1,000 mg by mouth  2 (two) times daily.    [provider]  metoprolol tartrate (LOPRESSOR) 50 MG tablet Take 50 mg by mouth 2 (two) times daily.    [provider]  ondansetron  (ZOFRAN -ODT) 4 MG disintegrating tablet Take 1 tablet (4 mg total) by mouth every 6 (six) hours as needed for nausea or vomiting. 08/10/24   Ward, Josette SAILOR, DO  paliperidone  (INVEGA ) 3 MG 24 hr tablet Take 2 tablets (6 mg total) by mouth at bedtime. 08/07/24   Dino Antu, MD  pantoprazole  (PROTONIX ) 40 MG tablet Take 40 mg by mouth daily.    [provider]  polyethylene glycol (MIRALAX / GLYCOLAX) 17 g packet Take 17 g by mouth daily as needed. 01/14/24   [provider]  pregabalin  (LYRICA ) 150 MG capsule Take 1 capsule (150 mg total) by mouth 2 (two) times daily. 08/07/24   Dino Antu, MD  rosuvastatin  (CRESTOR ) 5 MG tablet Take 5 mg by mouth daily.    [provider]  trospium (SANCTURA) 20 MG tablet Take 20 mg by mouth 2 (two) times daily.    [provider]    Physical Exam: Vitals:   08/10/24 2200 08/10/24 2300 08/11/24 0022 08/11/24 0100  BP: 132/74 (!) 166/92 137/69   Pulse: 64 73 70   Resp: 12 13  18    Temp:   98.7 F (37.1 C)   TempSrc:   Oral   SpO2: 100% 99% 100%   Weight:   98.4 kg 97.4 kg  Height:    5' 7 (1.702 m)   General: Not in acute distress.  Dry mucous membrane HEENT:       Eyes: PERRL, EOMI, no jaundice       ENT: No discharge from the ears and nose, no pharynx injection, no tonsillar enlargement.        Neck: No JVD, no bruit, no mass felt. Heme: No neck lymph node enlargement. Cardiac: S1/S2, RRR, No murmurs, No gallops or rubs. Respiratory: No rales, wheezing, rhonchi or rubs. GI: Soft, nondistended, nontender, no rebound pain, no organomegaly, BS present. GU: No hematuria Ext: No pitting leg edema bilaterally. 1+DP/PT pulse bilaterally. Musculoskeletal: No joint deformities, No joint redness or warmth, no limitation of ROM in spin. Skin: No rashes.  Neuro: Lethargic, arousable, oriented X3, cranial nerves II-XII grossly intact.  Has chronic mild right-sided weakness Psych: Patient is not psychotic, no suicidal or hemocidal ideation.  Labs on Admission: I have personally reviewed following labs and imaging studies  CBC: Recent Labs  Lab 08/09/24 2113 08/10/24 2018  WBC 8.7 9.0  NEUTROABS 4.6 3.9  HGB 11.1* 11.1*  HCT 34.1* 35.1*  MCV 93.2 94.4  PLT 213 221   Basic Metabolic Panel: Recent Labs  Lab 08/04/24 1258 08/09/24 2113 08/10/24 1730  NA 141 142 140  K 3.1* 3.1* 4.2  CL 100 101 101  CO2 28 30 26   GLUCOSE 219* 158* 114*  BUN 8 12 11   CREATININE 0.77 0.93 1.22*  CALCIUM  9.3 9.8 10.0   GFR: Estimated Creatinine Clearance: 61.7 mL/min (A) (by C-G formula based on SCr of 1.22 mg/dL (H)). Liver Function Tests: Recent Labs  Lab 08/10/24 1730  AST 27  ALT 19  ALKPHOS 67  BILITOT 1.0  PROT 7.8  ALBUMIN 3.9   No results for input(s): LIPASE, AMYLASE in the last 168 hours. No results for input(s): AMMONIA in the last 168 hours. Coagulation Profile: No results for input(s): INR, PROTIME in the last 168 hours. Cardiac  Enzymes: No results for input(s): CKTOTAL, CKMB, CKMBINDEX, TROPONINI in the last 168 hours. BNP (last 3 results) No results for input(s): PROBNP in the last 8760 hours. HbA1C: No results for input(s): HGBA1C in the last 72 hours. CBG: Recent Labs  Lab 08/06/24 1954 08/07/24 0754 08/07/24 1157 08/10/24 1655 08/11/24 0025  GLUCAP 178* 121* 155* 111* 91   Lipid Profile: No results for input(s): CHOL, HDL, LDLCALC, TRIG, CHOLHDL, LDLDIRECT in the last 72 hours. Thyroid Function Tests: No results for input(s): TSH, T4TOTAL, FREET4, T3FREE, THYROIDAB in the last 72 hours. Anemia Panel: No results for input(s): VITAMINB12, FOLATE, FERRITIN, TIBC, IRON, RETICCTPCT in the last 72 hours. Urine analysis:    Component Value Date/Time   COLORURINE YELLOW (A) 08/10/2024 2236   APPEARANCEUR HAZY (A) 08/10/2024 2236   LABSPEC 1.013 08/10/2024 2236   PHURINE 5.0 08/10/2024 2236   GLUCOSEU NEGATIVE 08/10/2024 2236   HGBUR NEGATIVE 08/10/2024 2236   BILIRUBINUR NEGATIVE 08/10/2024 2236   KETONESUR NEGATIVE 08/10/2024 2236   PROTEINUR NEGATIVE 08/10/2024 2236   NITRITE NEGATIVE 08/10/2024 2236   LEUKOCYTESUR LARGE (A) 08/10/2024 2236   Sepsis Labs: @LABRCNTIP (procalcitonin:4,lacticidven:4) ) Recent Results (from the past 240 hours)  Urine Culture (for pregnant, neutropenic or urologic patients or patients with an indwelling urinary catheter)     Status: Abnormal   Collection Time: 08/02/24  9:17 PM   Specimen: Urine, Clean Catch  Result Value Ref Range Status   Specimen Description   Final    URINE, CLEAN CATCH Performed at [facility], [address] [place], [place] [address]    Special Requests   Final    NONE Performed at [facility], [address], [place], [place] [address]    Culture (A)  Final    >=100,000 COLONIES/mL STAPHYLOCOCCUS EPIDERMIDIS CORRECTED RESULTS PREVIOUSLY REPORTED AS: GRAM NEGATIVE  RODS CORRECTED RESULTS CALLED TO: EUSTACE JEST RN, AT 1250 08/04/24 D. VANHOOK Performed at [facility], [address] [place], [place] [address]    Report Status 08/05/2024 FINAL  Final   Organism ID, Bacteria STAPHYLOCOCCUS EPIDERMIDIS (A)  Final      Susceptibility   Staphylococcus epidermidis - MIC*    CIPROFLOXACIN <=0.5 SENSITIVE Sensitive     GENTAMICIN <=0.5 SENSITIVE Sensitive     NITROFURANTOIN <=16 SENSITIVE Sensitive     OXACILLIN <=0.25 SENSITIVE Sensitive     TETRACYCLINE 2 SENSITIVE Sensitive     VANCOMYCIN 1 SENSITIVE Sensitive     TRIMETH/SULFA <=10 SENSITIVE Sensitive     RIFAMPIN <=0.5 SENSITIVE Sensitive     Inducible Clindamycin NEGATIVE Sensitive     * >=100,000 COLONIES/mL STAPHYLOCOCCUS EPIDERMIDIS  Blood culture (routine x 2)     Status: Abnormal   Collection Time: 08/03/24 12:05 AM   Specimen: BLOOD RIGHT FOREARM  Result Value Ref Range Status   Specimen Description   Final    BLOOD RIGHT FOREARM Performed at [facility], [address] [place], [place] [address]    Special Requests   Final    BOTTLES DRAWN AEROBIC ONLY Blood Culture adequate volume Performed at [facility], [address] [place], [place] [address]    Culture  Setup Time   Final    GRAM POSITIVE COCCI AEROBIC BOTTLE ONLY CRITICAL RESULT CALLED TO, READ BACK BY AND VERIFIED WITH: NATHAN PARKS @1950  ON 08/03/24 SKL    Culture (A)  Final    STAPHYLOCOCCUS HOMINIS THE SIGNIFICANCE OF ISOLATING THIS ORGANISM FROM A SINGLE SET OF  BLOOD CULTURES WHEN MULTIPLE SETS ARE DRAWN IS UNCERTAIN. PLEASE NOTIFY THE MICROBIOLOGY DEPARTMENT WITHIN ONE WEEK IF SPECIATION AND SENSITIVITIES ARE REQUIRED. Performed at [facility], [address] [place], [place] [address]    Report Status 08/06/2024 FINAL  Final  Blood Culture ID Panel (Reflexed)     Status: Abnormal   Collection Time: 08/03/24 12:05 AM  Result Value Ref Range Status   Enterococcus faecalis NOT  DETECTED NOT DETECTED Final   Enterococcus Faecium NOT DETECTED NOT DETECTED Final   Listeria monocytogenes NOT DETECTED NOT DETECTED Final   Staphylococcus species DETECTED (A) NOT DETECTED Final    Comment: CRITICAL RESULT CALLED TO, READ BACK BY AND VERIFIED WITH: NATHAN PARKS @1950  ON 08/03/24 SKL    Staphylococcus aureus (BCID) NOT DETECTED NOT DETECTED Final   Staphylococcus epidermidis NOT DETECTED NOT DETECTED Final   Staphylococcus lugdunensis NOT DETECTED NOT DETECTED Final   Streptococcus species NOT DETECTED NOT DETECTED Final   Streptococcus agalactiae NOT DETECTED NOT DETECTED Final   Streptococcus pneumoniae NOT DETECTED NOT DETECTED Final   Streptococcus pyogenes NOT DETECTED NOT DETECTED Final   A.calcoaceticus-baumannii NOT DETECTED NOT DETECTED Final   Bacteroides fragilis NOT DETECTED NOT DETECTED Final   Enterobacterales NOT DETECTED NOT DETECTED Final   Enterobacter cloacae complex NOT DETECTED NOT DETECTED Final   Escherichia coli NOT DETECTED NOT DETECTED Final   Klebsiella aerogenes NOT DETECTED NOT DETECTED Final   Klebsiella oxytoca NOT DETECTED NOT DETECTED Final   Klebsiella pneumoniae NOT DETECTED NOT DETECTED Final   Proteus species NOT DETECTED NOT DETECTED Final   Salmonella species NOT DETECTED NOT DETECTED Final   Serratia marcescens NOT DETECTED NOT DETECTED Final   Haemophilus influenzae NOT DETECTED NOT DETECTED Final   Neisseria meningitidis NOT DETECTED NOT DETECTED Final   Pseudomonas aeruginosa NOT DETECTED NOT DETECTED Final   Stenotrophomonas maltophilia NOT DETECTED NOT DETECTED Final   Candida albicans NOT DETECTED NOT DETECTED Final   Candida auris NOT DETECTED NOT DETECTED Final   Candida glabrata NOT DETECTED NOT DETECTED Final   Candida krusei NOT DETECTED NOT DETECTED Final   Candida parapsilosis NOT DETECTED NOT DETECTED Final   Candida tropicalis NOT DETECTED NOT DETECTED Final   Cryptococcus neoformans/gattii NOT DETECTED NOT  DETECTED Final    Comment: Performed at [facility], [address] [place], [place] [address]  Blood culture (routine x 2)     Status: None   Collection Time: 08/03/24 12:19 AM   Specimen: BLOOD LEFT HAND  Result Value Ref Range Status   Specimen Description BLOOD LEFT HAND  Final   Special Requests   Final    BOTTLES DRAWN AEROBIC ONLY Blood Culture results may not be optimal due to an inadequate volume of blood received in culture bottles   Culture   Final    NO GROWTH 5 DAYS Performed at [facility], [address] [place], [place] [address]    Report Status 08/08/2024 FINAL  Final  Resp panel by RT-PCR (RSV, Flu A&B, Covid) Anterior Nasal Swab     Status: None   Collection Time: 08/09/24  9:14 PM   Specimen: Anterior Nasal Swab  Result Value Ref Range Status   SARS Coronavirus 2 by RT PCR NEGATIVE NEGATIVE Final    Comment: (NOTE) SARS-CoV-2 target nucleic acids are NOT DETECTED.  The SARS-CoV-2 RNA is generally detectable in upper respiratory specimens during the acute phase of infection. The lowest concentration of SARS-CoV-2 viral copies this assay can  detect is 138 copies/mL. A negative result does not preclude SARS-Cov-2 infection and should not be used as the sole basis for treatment or other patient management decisions. A negative result may occur with  improper specimen collection/handling, submission of specimen other than nasopharyngeal swab, presence of viral mutation(s) within the areas targeted by this assay, and inadequate number of viral copies(<138 copies/mL). A negative result must be combined with clinical observations, patient history, and epidemiological information. The expected result is Negative.  Fact Sheet for Patients:  BloggerCourse.com  Fact Sheet for Healthcare Providers:  SeriousBroker.it  This test is no t yet approved or cleared by the [place]  FDA and  has  been authorized for detection and/or diagnosis of SARS-CoV-2 by FDA under an Emergency Use Authorization (EUA). This EUA will remain  in effect (meaning this test can be used) for the duration of the COVID-19 declaration under Section 564(b)(1) of the Act, 21 U.S.C.section 360bbb-3(b)(1), unless the authorization is terminated  or revoked sooner.       Influenza A by PCR NEGATIVE NEGATIVE Final   Influenza B by PCR NEGATIVE NEGATIVE Final    Comment: (NOTE) The Xpert Xpress SARS-CoV-2/FLU/RSV plus assay is intended as an aid in the diagnosis of influenza from Nasopharyngeal swab specimens and should not be used as a sole basis for treatment. Nasal washings and aspirates are unacceptable for Xpert Xpress SARS-CoV-2/FLU/RSV testing.  Fact Sheet for Patients: BloggerCourse.com  Fact Sheet for Healthcare Providers: SeriousBroker.it  This test is not yet approved or cleared by the [place]  FDA and has been authorized for detection and/or diagnosis of SARS-CoV-2 by FDA under an Emergency Use Authorization (EUA). This EUA will remain in effect (meaning this test can be used) for the duration of the COVID-19 declaration under Section 564(b)(1) of the Act, 21 U.S.C. section 360bbb-3(b)(1), unless the authorization is terminated or revoked.     Resp Syncytial Virus by PCR NEGATIVE NEGATIVE Final    Comment: (NOTE) Fact Sheet for Patients: BloggerCourse.com  Fact Sheet for Healthcare Providers: SeriousBroker.it  This test is not yet approved or cleared by the [place]  FDA and has been authorized for detection and/or diagnosis of SARS-CoV-2 by FDA under an Emergency Use Authorization (EUA). This EUA will remain in effect (meaning this test can be used) for the duration of the COVID-19 declaration under Section 564(b)(1) of the Act, 21 U.S.C. section 360bbb-3(b)(1), unless the  authorization is terminated or revoked.  Performed at [facility], [address] [place], [place] [address]      Radiological Exams on Admission:   Assessment/Plan Principal Problem:   UTI (urinary tract infection) Active Problems:   Lethargy   AKI (acute kidney injury)   Diabetes mellitus with complication of gastroparesis   COPD (chronic obstructive pulmonary disease) (HCC)   History of stroke   Fibromyalgia   Bipolar II disorder (HCC)   Depression with anxiety   Essential hypertension   Obesity (BMI 30-39.9)   Assessment and Plan:  UTI (urinary tract infection): Patient failed outpatient Keflex  treatment.  UA is still positive.  Patient complains of suprapubic abdominal pain, consistent with persistent UTI.  Not septic.  No fever or leukocytosis.  -Place in MedSurg bed for observation - IV Rocephin  - Follow-up urine culture  Lethargy: Likely due to UTI.  Patient is taking multiple sedative medications which may have contributed partially.  CT head negative. -Frequent neurocheck - Fall precaution - Hold Lyrica   AKI (acute kidney injury): Likely due to UTI, dehydration and  continuation of Cozaar . Baseline creatinine 0.77 on 08/04/2024.  Her creatinine is 1.22, GFR 52, BUN 11 today. -Hold Cozaar  - IV fluid: Patient was given 1 L normal saline  Diabetes mellitus with complication of gastroparesis: Recent A1c 10.3, poorly controlled.  Patient taking metformin and NovoLog  -SSI  COPD (chronic obstructive pulmonary disease) (HCC): Stable -Bronchodilators, as needed Mucinex  History of stroke -Crestor   Fibromyalgia -Continue home Subutex   Bipolar II disorder (HCC), Depression with anxiety -Continue home medications, lurasidone , Invega , BuSpar, Lexapro , paliperidone , Cogentin   Essential hypertension - Hydralazine as needed - Cozaar  is on hold due to poor worsening renal function - Metoprolol  Obesity: Patient has Obesity Class I, with body weight  97.4 Kg and BMI 33.63 kg/m2.  - Encourage losing weight - Exercise and healthy diet        DVT ppx: SQ Lovenox   Code Status: Full code   Family Communication:     not done, no family member is at bed side.     Disposition Plan:  Anticipate discharge back to previous environment  Consults called:  none  Admission status and Level of care: Med-Surg:    for obs     Dispo: The patient is from: Home              Anticipated d/c is to: Home              Anticipated d/c date is: 1 day              Patient currently is not medically stable to d/c.    Severity of Illness:  The appropriate patient status for this patient is OBSERVATION. Observation status is judged to be reasonable and necessary in order to provide the required intensity of service to ensure the patient's safety. The patient's presenting symptoms, physical exam findings, and initial radiographic and laboratory data in the context of their medical condition is felt to place them at decreased risk for further clinical deterioration. Furthermore, it is anticipated that the patient will be medically stable for discharge from the hospital within 2 midnights of admission.        Date of Service 08/11/2024    Caleb Exon [facility]   If 7PM-7AM, please contact night-coverage [email or web address] 08/11/2024, 1:06 AM

## 2024-08-10 NOTE — ED Triage Notes (Signed)
 Pt BIB EMS from home with concerns from family that pt was to sleepy. Per EMS pt was seen in the the ED and dx with a UTI. Per Ems family was concerned that pt would not wake up enough talk or take her medications. Pt is snoring during triage. Pt asked by this RN if she knew why she was her she shook her head no. Pt was asked if she knew why family was concerned. Pt again shook her head no.

## 2024-08-10 NOTE — ED Notes (Signed)
 Pts friend Meade can be contacted at [phone].

## 2024-08-10 NOTE — Discharge Instructions (Addendum)
 I recommend that you increase your water and fiber intake. If you are not able to eat foods high in fiber, you may use Benefiber or Metamucil over-the-counter. I also recommend you use MiraLAX 1-2 times a day and Colace 100 mg twice a day to help with bowel movements. These medications are over the counter.  You may use other over-the-counter medications such as Dulcolax, Fleet enemas, magnesium citrate as needed for constipation. Please note that some of these medications may cause you to have abdominal cramping which is normal. If you develop severe abdominal pain, fever (temperature of 100.4 or higher), persistent vomiting, distention of your abdomen, unable to have a bowel movement for 5 days or are not passing gas, please return to the hospital.

## 2024-08-11 DIAGNOSIS — F3181 Bipolar II disorder: Secondary | ICD-10-CM | POA: Diagnosis present

## 2024-08-11 DIAGNOSIS — T50915A Adverse effect of multiple unspecified drugs, medicaments and biological substances, initial encounter: Secondary | ICD-10-CM | POA: Diagnosis present

## 2024-08-11 DIAGNOSIS — R1084 Generalized abdominal pain: Secondary | ICD-10-CM | POA: Diagnosis present

## 2024-08-11 DIAGNOSIS — N390 Urinary tract infection, site not specified: Secondary | ICD-10-CM | POA: Diagnosis present

## 2024-08-11 DIAGNOSIS — Z7951 Long term (current) use of inhaled steroids: Secondary | ICD-10-CM | POA: Diagnosis not present

## 2024-08-11 DIAGNOSIS — Z794 Long term (current) use of insulin: Secondary | ICD-10-CM | POA: Diagnosis not present

## 2024-08-11 DIAGNOSIS — E876 Hypokalemia: Secondary | ICD-10-CM | POA: Diagnosis present

## 2024-08-11 DIAGNOSIS — I69351 Hemiplegia and hemiparesis following cerebral infarction affecting right dominant side: Secondary | ICD-10-CM | POA: Diagnosis not present

## 2024-08-11 DIAGNOSIS — E1165 Type 2 diabetes mellitus with hyperglycemia: Secondary | ICD-10-CM | POA: Diagnosis present

## 2024-08-11 DIAGNOSIS — E669 Obesity, unspecified: Secondary | ICD-10-CM | POA: Diagnosis present

## 2024-08-11 DIAGNOSIS — E66811 Obesity, class 1: Secondary | ICD-10-CM | POA: Diagnosis present

## 2024-08-11 DIAGNOSIS — K5909 Other constipation: Secondary | ICD-10-CM | POA: Diagnosis present

## 2024-08-11 DIAGNOSIS — F4310 Post-traumatic stress disorder, unspecified: Secondary | ICD-10-CM | POA: Diagnosis present

## 2024-08-11 DIAGNOSIS — N3000 Acute cystitis without hematuria: Secondary | ICD-10-CM | POA: Diagnosis not present

## 2024-08-11 DIAGNOSIS — Z7984 Long term (current) use of oral hypoglycemic drugs: Secondary | ICD-10-CM | POA: Diagnosis not present

## 2024-08-11 DIAGNOSIS — K3184 Gastroparesis: Secondary | ICD-10-CM | POA: Diagnosis present

## 2024-08-11 DIAGNOSIS — E860 Dehydration: Secondary | ICD-10-CM | POA: Diagnosis present

## 2024-08-11 DIAGNOSIS — F17210 Nicotine dependence, cigarettes, uncomplicated: Secondary | ICD-10-CM | POA: Diagnosis present

## 2024-08-11 DIAGNOSIS — J4489 Other specified chronic obstructive pulmonary disease: Secondary | ICD-10-CM | POA: Diagnosis present

## 2024-08-11 DIAGNOSIS — Z6833 Body mass index (BMI) 33.0-33.9, adult: Secondary | ICD-10-CM | POA: Diagnosis not present

## 2024-08-11 DIAGNOSIS — M797 Fibromyalgia: Secondary | ICD-10-CM | POA: Diagnosis present

## 2024-08-11 DIAGNOSIS — F419 Anxiety disorder, unspecified: Secondary | ICD-10-CM | POA: Diagnosis present

## 2024-08-11 DIAGNOSIS — E1143 Type 2 diabetes mellitus with diabetic autonomic (poly)neuropathy: Secondary | ICD-10-CM | POA: Diagnosis present

## 2024-08-11 DIAGNOSIS — R5383 Other fatigue: Secondary | ICD-10-CM | POA: Diagnosis present

## 2024-08-11 DIAGNOSIS — N179 Acute kidney failure, unspecified: Secondary | ICD-10-CM | POA: Diagnosis present

## 2024-08-11 DIAGNOSIS — Z1152 Encounter for screening for COVID-19: Secondary | ICD-10-CM | POA: Diagnosis not present

## 2024-08-11 DIAGNOSIS — I10 Essential (primary) hypertension: Secondary | ICD-10-CM | POA: Diagnosis present

## 2024-08-11 LAB — URINE DRUG SCREEN, QUALITATIVE (ARMC ONLY)
Amphetamines, Ur Screen: NOT DETECTED
Barbiturates, Ur Screen: NOT DETECTED
Benzodiazepine, Ur Scrn: NOT DETECTED
Cannabinoid 50 Ng, Ur ~~LOC~~: NOT DETECTED
Cocaine Metabolite,Ur ~~LOC~~: NOT DETECTED
MDMA (Ecstasy)Ur Screen: NOT DETECTED
Methadone Scn, Ur: NOT DETECTED
Opiate, Ur Screen: POSITIVE — AB
Phencyclidine (PCP) Ur S: NOT DETECTED
Tricyclic, Ur Screen: NOT DETECTED

## 2024-08-11 LAB — GLUCOSE, CAPILLARY
Glucose-Capillary: 155 mg/dL — ABNORMAL HIGH (ref 70–99)
Glucose-Capillary: 164 mg/dL — ABNORMAL HIGH (ref 70–99)
Glucose-Capillary: 183 mg/dL — ABNORMAL HIGH (ref 70–99)
Glucose-Capillary: 189 mg/dL — ABNORMAL HIGH (ref 70–99)
Glucose-Capillary: 91 mg/dL (ref 70–99)

## 2024-08-11 LAB — CBC
HCT: 30.4 % — ABNORMAL LOW (ref 36.0–46.0)
Hemoglobin: 10 g/dL — ABNORMAL LOW (ref 12.0–15.0)
MCH: 30.4 pg (ref 26.0–34.0)
MCHC: 32.9 g/dL (ref 30.0–36.0)
MCV: 92.4 fL (ref 80.0–100.0)
Platelets: 191 K/uL (ref 150–400)
RBC: 3.29 MIL/uL — ABNORMAL LOW (ref 3.87–5.11)
RDW: 13.3 % (ref 11.5–15.5)
WBC: 8.4 K/uL (ref 4.0–10.5)
nRBC: 0 % (ref 0.0–0.2)

## 2024-08-11 LAB — BASIC METABOLIC PANEL WITH GFR
Anion gap: 8 (ref 5–15)
BUN: 11 mg/dL (ref 6–20)
CO2: 27 mmol/L (ref 22–32)
Calcium: 9.1 mg/dL (ref 8.9–10.3)
Chloride: 104 mmol/L (ref 98–111)
Creatinine, Ser: 0.88 mg/dL (ref 0.44–1.00)
GFR, Estimated: >60 mL/min (ref >60)
Glucose, Bld: 186 mg/dL — ABNORMAL HIGH (ref 70–99)
Potassium: 3.1 mmol/L — ABNORMAL LOW (ref 3.5–5.1)
Sodium: 139 mmol/L (ref 135–145)

## 2024-08-11 LAB — URINE CULTURE

## 2024-08-11 MED ORDER — FLUTICASONE FUROATE-VILANTEROL 200-25 MCG/ACT IN AEPB
1.0000 | INHALATION_SPRAY | Freq: Every day | RESPIRATORY_TRACT | Status: DC
  Administered 2024-08-11 – 2024-08-15 (×5): 1 via RESPIRATORY_TRACT
  Filled 2024-08-11: qty 28

## 2024-08-11 MED ORDER — PREGABALIN 75 MG PO CAPS
150.0000 mg | ORAL_CAPSULE | Freq: Two times a day (BID) | ORAL | Status: DC
Start: 2024-08-11 — End: 2024-08-15
  Administered 2024-08-11 – 2024-08-15 (×9): 150 mg via ORAL
  Filled 2024-08-11 (×9): qty 2

## 2024-08-11 MED ORDER — ESCITALOPRAM OXALATE 10 MG PO TABS
20.0000 mg | ORAL_TABLET | Freq: Every day | ORAL | Status: DC
Start: 2024-08-11 — End: 2024-08-15
  Administered 2024-08-11 – 2024-08-15 (×5): 20 mg via ORAL
  Filled 2024-08-11 (×5): qty 2

## 2024-08-11 MED ORDER — POTASSIUM CHLORIDE CRYS ER 20 MEQ PO TBCR
40.0000 meq | EXTENDED_RELEASE_TABLET | Freq: Once | ORAL | Status: AC
  Administered 2024-08-11: 40 meq via ORAL
  Filled 2024-08-11: qty 2

## 2024-08-11 MED ORDER — LACTULOSE 10 GM/15ML PO SOLN: 30.0000 g | Freq: Every day | ORAL | Status: DC | PRN

## 2024-08-11 MED ORDER — LINACLOTIDE 145 MCG PO CAPS
290.0000 ug | ORAL_CAPSULE | Freq: Every day | ORAL | Status: DC
  Administered 2024-08-12 – 2024-08-15 (×4): 290 ug via ORAL
  Filled 2024-08-11 (×4): qty 2

## 2024-08-11 MED ORDER — BENZTROPINE MESYLATE 1 MG PO TABS
0.5000 mg | ORAL_TABLET | Freq: Two times a day (BID) | ORAL | Status: DC | PRN
Start: 2024-08-11 — End: 2024-08-15

## 2024-08-11 MED ORDER — PANTOPRAZOLE SODIUM 40 MG PO TBEC
40.0000 mg | DELAYED_RELEASE_TABLET | Freq: Every day | ORAL | Status: DC
  Administered 2024-08-11 – 2024-08-15 (×5): 40 mg via ORAL
  Filled 2024-08-11 (×5): qty 1

## 2024-08-11 MED ORDER — PALIPERIDONE ER 3 MG PO TB24
6.0000 mg | ORAL_TABLET | Freq: Every day | ORAL | Status: DC
  Administered 2024-08-11 – 2024-08-14 (×4): 6 mg via ORAL
  Filled 2024-08-11 (×4): qty 2

## 2024-08-11 MED ORDER — BUSPIRONE HCL 10 MG PO TABS
15.0000 mg | ORAL_TABLET | Freq: Three times a day (TID) | ORAL | Status: DC
  Administered 2024-08-11 – 2024-08-15 (×12): 15 mg via ORAL
  Filled 2024-08-11 (×10): qty 2
  Filled 2024-08-11: qty 1.5
  Filled 2024-08-11 (×3): qty 2

## 2024-08-11 MED ORDER — BUPRENORPHINE HCL 2 MG SL SUBL
2.0000 mg | SUBLINGUAL_TABLET | Freq: Three times a day (TID) | SUBLINGUAL | Status: DC
  Administered 2024-08-11 – 2024-08-15 (×12): 2 mg via SUBLINGUAL
  Filled 2024-08-11 (×13): qty 1

## 2024-08-11 MED ORDER — NALOXONE HCL 0.4 MG/ML IJ SOLN
0.4000 mg | Freq: Once | INTRAMUSCULAR | Status: AC
  Administered 2024-08-11: 0.4 mg via INTRAVENOUS
  Filled 2024-08-11: qty 1

## 2024-08-11 MED ORDER — ROSUVASTATIN CALCIUM 10 MG PO TABS
5.0000 mg | ORAL_TABLET | Freq: Every day | ORAL | Status: DC
  Administered 2024-08-11 – 2024-08-15 (×5): 5 mg via ORAL
  Filled 2024-08-11 (×5): qty 1

## 2024-08-11 MED ORDER — HYDROCODONE-ACETAMINOPHEN 7.5-325 MG PO TABS: 1.0000 | ORAL_TABLET | Freq: Four times a day (QID) | ORAL | Status: DC | PRN

## 2024-08-11 MED ORDER — LURASIDONE HCL 40 MG PO TABS
120.0000 mg | ORAL_TABLET | Freq: Every day | ORAL | Status: DC
  Administered 2024-08-11 – 2024-08-15 (×5): 120 mg via ORAL
  Filled 2024-08-11 (×5): qty 3

## 2024-08-11 MED ORDER — METOPROLOL TARTRATE 50 MG PO TABS
50.0000 mg | ORAL_TABLET | Freq: Two times a day (BID) | ORAL | Status: DC
Start: 2024-08-11 — End: 2024-08-15
  Administered 2024-08-11 – 2024-08-15 (×9): 50 mg via ORAL
  Filled 2024-08-11 (×9): qty 1

## 2024-08-11 NOTE — Plan of Care (Signed)
  Problem: Coping: Goal: Ability to adjust to condition or change in health will improve Outcome: Progressing   Problem: Fluid Volume: Goal: Ability to maintain a balanced intake and output will improve Outcome: Progressing   Problem: Metabolic: Goal: Ability to maintain appropriate glucose levels will improve Outcome: Progressing   Problem: Tissue Perfusion: Goal: Adequacy of tissue perfusion will improve Outcome: Progressing   Problem: Pain Managment: Goal: General experience of comfort will improve and/or be controlled Outcome: Progressing   Problem: Safety: Goal: Ability to remain free from injury will improve Outcome: Progressing   Problem: Elimination: Goal: Will not experience complications related to bowel motility Outcome: Progressing

## 2024-08-11 NOTE — Progress Notes (Addendum)
 PROGRESS NOTE    Alicia Mayo  FMW:[id number] DOB: 22-Feb-1966 DOA: 08/10/2024 PCP: Dante Elsie Celestia MADISON, MD    Assessment & Plan:   Principal Problem:   UTI (urinary tract infection) Active Problems:   Lethargy   AKI (acute kidney injury)   Diabetes mellitus with complication of gastroparesis   COPD (chronic obstructive pulmonary disease) (HCC)   History of stroke   Fibromyalgia   Bipolar II disorder (HCC)   Depression with anxiety   Essential hypertension   Obesity (BMI 30-39.9)  Assessment and Plan: UTI : failed outpatient keflex . Urine cx is pending. Continue on IV rocephin    Lethargy: Likely due to UTI & polypharmacy secondary to multiple sedating meds. CT head negative. Pt requested to restart home dose of pregabalin     AKI: resolved   DM2: poorly controlled, HbA1c 10.3. Continue on SSI w/ accuchecks   Hypokalemia: potassium given    Hx of COPD: w/o exacerbation. Bronchodilators prn    Hx of CVA: continue on statin   Fibromyalgia: continue on home dose of subutex    Bipolar II disorder: w/ depression & anxiety. Continue on home dose of lurasidone , invega , buspar, lexapro , cogentin , paliperidone    HTN: continue on home dose of metoprolol. Holding home losartan    Obesity: BMI 33.6. Would benefit from weight loss        DVT prophylaxis: lovenox  Code Status: full  Family Communication: discussed pt's care w/ pt's caretaker, [name], and answered his questions Disposition Plan: depends on PT/OT recs   Status is: Inpatient Remains inpatient appropriate because: severity of illnes    Level of care: Med-Surg Consultants:    Procedures:   Antimicrobials: rocephin    Subjective: Pt c/o malaise & pain everywhere   Objective: Vitals:   08/11/24 0022 08/11/24 0100 08/11/24 0442 08/11/24 0733  BP: 137/69  (!) 129/59 99/77  Pulse: 70  82 95  Resp: 18  15 18   Temp: 98.7 F (37.1 C)  98.9 F (37.2 C) 98.1 F (36.7 C)  TempSrc: Oral  Oral Oral   SpO2: 100%  98% 99%  Weight: 98.4 kg 97.4 kg    Height:  5' 7 (1.702 m)      Intake/Output Summary (Last 24 hours) at 08/11/2024 0841 Last data filed at 08/11/2024 0350 Gross per 24 hour  Intake 338.63 ml  Output --  Net 338.63 ml   Filed Weights   08/11/24 0022 08/11/24 0100  Weight: 98.4 kg 97.4 kg    Examination:  General exam: Appears calm and comfortable  Respiratory system: Clear to auscultation. Respiratory effort normal. Cardiovascular system: S1 & S2+. No rubs, gallops or clicks.  Gastrointestinal system: Abdomen is nondistended, soft and nontender. Normal bowel sounds heard. Central nervous system: Alert and oriented. Moves all extremities  Psychiatry: Judgement and insight appears at baseline. Flat mood and affect   Data Reviewed: I have personally reviewed following labs and imaging studies  CBC: Recent Labs  Lab 08/09/24 2113 08/10/24 2018 08/11/24 0453  WBC 8.7 9.0 8.4  NEUTROABS 4.6 3.9  --   HGB 11.1* 11.1* 10.0*  HCT 34.1* 35.1* 30.4*  MCV 93.2 94.4 92.4  PLT 213 221 191   Basic Metabolic Panel: Recent Labs  Lab 08/04/24 1258 08/09/24 2113 08/10/24 1730 08/11/24 0453  NA 141 142 140 139  K 3.1* 3.1* 4.2 3.1*  CL 100 101 101 104  CO2 28 30 26 27   GLUCOSE 219* 158* 114* 186*  BUN 8 12 11 11   CREATININE 0.77 0.93 1.22*  0.88  CALCIUM  9.3 9.8 10.0 9.1   GFR: Estimated Creatinine Clearance: 85.5 mL/min (by C-G formula based on SCr of 0.88 mg/dL). Liver Function Tests: Recent Labs  Lab 08/10/24 1730  AST 27  ALT 19  ALKPHOS 67  BILITOT 1.0  PROT 7.8  ALBUMIN 3.9   No results for input(s): LIPASE, AMYLASE in the last 168 hours. No results for input(s): AMMONIA in the last 168 hours. Coagulation Profile: No results for input(s): INR, PROTIME in the last 168 hours. Cardiac Enzymes: No results for input(s): CKTOTAL, CKMB, CKMBINDEX, TROPONINI in the last 168 hours. BNP (last 3 results) No results for input(s):  PROBNP in the last 8760 hours. HbA1C: No results for input(s): HGBA1C in the last 72 hours. CBG: Recent Labs  Lab 08/07/24 0754 08/07/24 1157 08/10/24 1655 08/11/24 0025 08/11/24 0742  GLUCAP 121* 155* 111* 91 164*   Lipid Profile: No results for input(s): CHOL, HDL, LDLCALC, TRIG, CHOLHDL, LDLDIRECT in the last 72 hours. Thyroid Function Tests: No results for input(s): TSH, T4TOTAL, FREET4, T3FREE, THYROIDAB in the last 72 hours. Anemia Panel: No results for input(s): VITAMINB12, FOLATE, FERRITIN, TIBC, IRON, RETICCTPCT in the last 72 hours. Sepsis Labs: No results for input(s): PROCALCITON, LATICACIDVEN in the last 168 hours.  Recent Results (from the past 240 hours)  Urine Culture (for pregnant, neutropenic or urologic patients or patients with an indwelling urinary catheter)     Status: Abnormal   Collection Time: 08/02/24  9:17 PM   Specimen: Urine, Clean Catch  Result Value Ref Range Status   Specimen Description   Final    URINE, CLEAN CATCH Performed at [facility], [address], [place], [place] [address]    Special Requests   Final    NONE Performed at [facility], [address], [place], [place] [address]    Culture (A)  Final    >=100,000 COLONIES/mL STAPHYLOCOCCUS EPIDERMIDIS CORRECTED RESULTS PREVIOUSLY REPORTED AS: GRAM NEGATIVE RODS CORRECTED RESULTS CALLED TO: EUSTACE JEST RN, AT 1250 08/04/24 D. VANHOOK Performed at [facility], [address] [place], [place] [address]    Report Status 08/05/2024 FINAL  Final   Organism ID, Bacteria STAPHYLOCOCCUS EPIDERMIDIS (A)  Final      Susceptibility   Staphylococcus epidermidis - MIC*    CIPROFLOXACIN <=0.5 SENSITIVE Sensitive     GENTAMICIN <=0.5 SENSITIVE Sensitive     NITROFURANTOIN <=16 SENSITIVE Sensitive     OXACILLIN <=0.25 SENSITIVE Sensitive     TETRACYCLINE 2 SENSITIVE Sensitive     VANCOMYCIN 1 SENSITIVE Sensitive      TRIMETH/SULFA <=10 SENSITIVE Sensitive     RIFAMPIN <=0.5 SENSITIVE Sensitive     Inducible Clindamycin NEGATIVE Sensitive     * >=100,000 COLONIES/mL STAPHYLOCOCCUS EPIDERMIDIS  Blood culture (routine x 2)     Status: Abnormal   Collection Time: 08/03/24 12:05 AM   Specimen: BLOOD RIGHT FOREARM  Result Value Ref Range Status   Specimen Description   Final    BLOOD RIGHT FOREARM Performed at [facility], [address], [place], [place] [address]    Special Requests   Final    BOTTLES DRAWN AEROBIC ONLY Blood Culture adequate volume Performed at [facility], [address] [place], [place] [address]    Culture  Setup Time   Final    GRAM POSITIVE COCCI AEROBIC BOTTLE ONLY CRITICAL RESULT CALLED TO, READ BACK BY AND VERIFIED WITH: NATHAN PARKS @1950  ON 08/03/24 SKL  Culture (A)  Final    STAPHYLOCOCCUS HOMINIS THE SIGNIFICANCE OF ISOLATING THIS ORGANISM FROM A SINGLE SET OF BLOOD CULTURES WHEN MULTIPLE SETS ARE DRAWN IS UNCERTAIN. PLEASE NOTIFY THE MICROBIOLOGY DEPARTMENT WITHIN ONE WEEK IF SPECIATION AND SENSITIVITIES ARE REQUIRED. Performed at [facility], [address] [place], [place] [address]    Report Status 08/06/2024 FINAL  Final  Blood Culture ID Panel (Reflexed)     Status: Abnormal   Collection Time: 08/03/24 12:05 AM  Result Value Ref Range Status   Enterococcus faecalis NOT DETECTED NOT DETECTED Final   Enterococcus Faecium NOT DETECTED NOT DETECTED Final   Listeria monocytogenes NOT DETECTED NOT DETECTED Final   Staphylococcus species DETECTED (A) NOT DETECTED Final    Comment: CRITICAL RESULT CALLED TO, READ BACK BY AND VERIFIED WITH: NATHAN PARKS @1950  ON 08/03/24 SKL    Staphylococcus aureus (BCID) NOT DETECTED NOT DETECTED Final   Staphylococcus epidermidis NOT DETECTED NOT DETECTED Final   Staphylococcus lugdunensis NOT DETECTED NOT DETECTED Final   Streptococcus species NOT DETECTED NOT DETECTED Final   Streptococcus agalactiae  NOT DETECTED NOT DETECTED Final   Streptococcus pneumoniae NOT DETECTED NOT DETECTED Final   Streptococcus pyogenes NOT DETECTED NOT DETECTED Final   A.calcoaceticus-baumannii NOT DETECTED NOT DETECTED Final   Bacteroides fragilis NOT DETECTED NOT DETECTED Final   Enterobacterales NOT DETECTED NOT DETECTED Final   Enterobacter cloacae complex NOT DETECTED NOT DETECTED Final   Escherichia coli NOT DETECTED NOT DETECTED Final   Klebsiella aerogenes NOT DETECTED NOT DETECTED Final   Klebsiella oxytoca NOT DETECTED NOT DETECTED Final   Klebsiella pneumoniae NOT DETECTED NOT DETECTED Final   Proteus species NOT DETECTED NOT DETECTED Final   Salmonella species NOT DETECTED NOT DETECTED Final   Serratia marcescens NOT DETECTED NOT DETECTED Final   Haemophilus influenzae NOT DETECTED NOT DETECTED Final   Neisseria meningitidis NOT DETECTED NOT DETECTED Final   Pseudomonas aeruginosa NOT DETECTED NOT DETECTED Final   Stenotrophomonas maltophilia NOT DETECTED NOT DETECTED Final   Candida albicans NOT DETECTED NOT DETECTED Final   Candida auris NOT DETECTED NOT DETECTED Final   Candida glabrata NOT DETECTED NOT DETECTED Final   Candida krusei NOT DETECTED NOT DETECTED Final   Candida parapsilosis NOT DETECTED NOT DETECTED Final   Candida tropicalis NOT DETECTED NOT DETECTED Final   Cryptococcus neoformans/gattii NOT DETECTED NOT DETECTED Final    Comment: Performed at [facility], [address] [place], [place] [address]  Blood culture (routine x 2)     Status: None   Collection Time: 08/03/24 12:19 AM   Specimen: BLOOD LEFT HAND  Result Value Ref Range Status   Specimen Description BLOOD LEFT HAND  Final   Special Requests   Final    BOTTLES DRAWN AEROBIC ONLY Blood Culture results may not be optimal due to an inadequate volume of blood received in culture bottles   Culture   Final    NO GROWTH 5 DAYS Performed at [facility], [address] [place], [place]  [address]    Report Status 08/08/2024 FINAL  Final  Resp panel by RT-PCR (RSV, Flu A&B, Covid) Anterior Nasal Swab     Status: None   Collection Time: 08/09/24  9:14 PM   Specimen: Anterior Nasal Swab  Result Value Ref Range Status   SARS Coronavirus 2 by RT PCR NEGATIVE NEGATIVE Final    Comment: (NOTE) SARS-CoV-2 target nucleic acids are NOT DETECTED.  The SARS-CoV-2 RNA is generally detectable  in upper respiratory specimens during the acute phase of infection. The lowest concentration of SARS-CoV-2 viral copies this assay can detect is 138 copies/mL. A negative result does not preclude SARS-Cov-2 infection and should not be used as the sole basis for treatment or other patient management decisions. A negative result may occur with  improper specimen collection/handling, submission of specimen other than nasopharyngeal swab, presence of viral mutation(s) within the areas targeted by this assay, and inadequate number of viral copies(<138 copies/mL). A negative result must be combined with clinical observations, patient history, and epidemiological information. The expected result is Negative.  Fact Sheet for Patients:  BloggerCourse.com  Fact Sheet for Healthcare Providers:  SeriousBroker.it  This test is no t yet approved or cleared by the [place]  FDA and  has been authorized for detection and/or diagnosis of SARS-CoV-2 by FDA under an Emergency Use Authorization (EUA). This EUA will remain  in effect (meaning this test can be used) for the duration of the COVID-19 declaration under Section 564(b)(1) of the Act, 21 U.S.C.section 360bbb-3(b)(1), unless the authorization is terminated  or revoked sooner.       Influenza A by PCR NEGATIVE NEGATIVE Final   Influenza B by PCR NEGATIVE NEGATIVE Final    Comment: (NOTE) The Xpert Xpress SARS-CoV-2/FLU/RSV plus assay is intended as an aid in the diagnosis of influenza from  Nasopharyngeal swab specimens and should not be used as a sole basis for treatment. Nasal washings and aspirates are unacceptable for Xpert Xpress SARS-CoV-2/FLU/RSV testing.  Fact Sheet for Patients: BloggerCourse.com  Fact Sheet for Healthcare Providers: SeriousBroker.it  This test is not yet approved or cleared by the [place]  FDA and has been authorized for detection and/or diagnosis of SARS-CoV-2 by FDA under an Emergency Use Authorization (EUA). This EUA will remain in effect (meaning this test can be used) for the duration of the COVID-19 declaration under Section 564(b)(1) of the Act, 21 U.S.C. section 360bbb-3(b)(1), unless the authorization is terminated or revoked.     Resp Syncytial Virus by PCR NEGATIVE NEGATIVE Final    Comment: (NOTE) Fact Sheet for Patients: BloggerCourse.com  Fact Sheet for Healthcare Providers: SeriousBroker.it  This test is not yet approved or cleared by the [place]  FDA and has been authorized for detection and/or diagnosis of SARS-CoV-2 by FDA under an Emergency Use Authorization (EUA). This EUA will remain in effect (meaning this test can be used) for the duration of the COVID-19 declaration under Section 564(b)(1) of the Act, 21 U.S.C. section 360bbb-3(b)(1), unless the authorization is terminated or revoked.  Performed at [facility], [address], [place], [place] [address]          Radiology Studies: CT Head Wo Contrast Result Date: 08/10/2024 EXAM: CT HEAD WITHOUT CONTRAST 08/10/2024 05:39:52 PM TECHNIQUE: CT of the head was performed without the administration of intravenous contrast. Automated exposure control, iterative reconstruction, and/or weight based adjustment of the mA/kV was utilized to reduce the radiation dose to as low as reasonably achievable. COMPARISON: CT head 08/02/2024. CLINICAL HISTORY:  ams. Table formatting from the original note was not included.; Triage note:; Pt BIB EMS from home with concerns from family that pt was to sleepy. Per EMS pt was seen in the the ED and dx with a UTI. Per Ems family was concerned that pt would not wake up enough talk or take her medications. Pt is snoring during triage. Pt ; asked by this RN if she knew why she was her she shook her head  no. Pt was asked if she knew why family was concerned. Pt again shook her head no. FINDINGS: BRAIN AND VENTRICLES: No acute hemorrhage. No evidence of acute infarct. No hydrocephalus. No extra-axial collection. No mass effect or midline shift. ORBITS: No acute abnormality. SINUSES: Similar minimal mucosal thickening in the paranasal sinuses. SOFT TISSUES AND SKULL: No acute soft tissue abnormality. No skull fracture. IMPRESSION: 1. No acute intracranial abnormality. Electronically signed by: Donnice Mania MD 08/10/2024 06:01 PM EDT RP Workstation: [id number]   CT ABDOMEN PELVIS WO CONTRAST Result Date: 08/10/2024 CLINICAL DATA:  Abdomen pain body aches EXAM: CT ABDOMEN AND PELVIS WITHOUT CONTRAST TECHNIQUE: Multidetector CT imaging of the abdomen and pelvis was performed following the standard protocol without IV contrast. RADIATION DOSE REDUCTION: This exam was performed according to the departmental dose-optimization program which includes automated exposure control, adjustment of the mA and/or kV according to patient size and/or use of iterative reconstruction technique. COMPARISON:  CT 08/02/2024, 07/01/2023 FINDINGS: Lower chest: No acute abnormality. Hepatobiliary: No focal liver abnormality is seen. Status post cholecystectomy. Prominence of the common bile duct likely due to postsurgical change. This finding is unchanged. Pancreas: Unremarkable. No pancreatic ductal dilatation or surrounding inflammatory changes. Spleen: Normal in size without focal abnormality. Adrenals/Urinary Tract: Adrenal glands are unremarkable.  Kidneys are normal, without renal calculi, focal lesion, or hydronephrosis. Bladder is unremarkable. Stomach/Bowel: Stomach within normal limits. No dilated small bowel. No acute bowel wall thickening. Negative appendix. Large stool burden Vascular/Lymphatic: Aortic atherosclerosis. No enlarged abdominal or pelvic lymph nodes. Reproductive: Prostate is unremarkable. Other: No abdominal wall hernia or abnormality. No abdominopelvic ascites. Musculoskeletal: No acute or significant osseous findings. IMPRESSION: 1. No CT evidence for acute intra-abdominal or pelvic abnormality. 2. Large stool burden. 3. Aortic atherosclerosis. Aortic Atherosclerosis (ICD10-I70.0). Electronically Signed   By: Luke Bun M.D.   On: 08/10/2024 00:39   DG Abdomen 1 View Result Date: 08/09/2024 EXAM: 1 VIEW XRAY OF THE ABDOMEN 08/09/2024 09:56:36 PM COMPARISON: CT abdomen and pelvis from 08/02/2024. CLINICAL HISTORY: Constipation. Pt reports constipation x8 days and generalized body aches. Pt states she was just admitted to the hospital for AMS and UTI, pt was sent home with at home rehab. FINDINGS: BOWEL: Moderate to large colonic stool burden. SOFT TISSUES: Cholecystectomy. No opaque urinary calculi. BONES: No acute osseous abnormality. IMPRESSION: 1. Moderate to large colonic stool burden. Electronically signed by: Norman Gatlin MD 08/09/2024 09:59 PM EDT RP Workstation: [id number]        Scheduled Meds:  buprenorphine   2 mg Sublingual TID   busPIRone  15 mg Oral TID   enoxaparin  (LOVENOX ) injection  50 mg Subcutaneous Q24H   escitalopram   20 mg Oral Daily   fluticasone furoate-vilanterol  1 puff Inhalation Daily   insulin  aspart  0-5 Units Subcutaneous QHS   insulin  aspart  0-9 Units Subcutaneous TID WC   lurasidone   120 mg Oral Daily   metoprolol tartrate  50 mg Oral BID   nicotine  21 mg Transdermal Daily   paliperidone   6 mg Oral QHS   pantoprazole   40 mg Oral Daily   rosuvastatin   5 mg Oral Daily    Continuous Infusions:  cefTRIAXone  (ROCEPHIN )  IV       LOS: 0 days      Anthony CHRISTELLA Pouch, MD [facility] Pager [phone]  If 7PM-7AM, please contact night-coverage [email or web address] 08/11/2024, 8:41 AM

## 2024-08-11 NOTE — Plan of Care (Signed)

## 2024-08-11 NOTE — Progress Notes (Signed)
 Unable to do neuro check fully because patient is to sleepy to wake enough to assess. Patient mumbled no when asked her name and went back to sleep. Patient is squeezing her eyes shut when I tried to assess pupils.   MD aware.

## 2024-08-12 DIAGNOSIS — N3000 Acute cystitis without hematuria: Secondary | ICD-10-CM | POA: Diagnosis not present

## 2024-08-12 LAB — CBC
HCT: 30.7 % — ABNORMAL LOW (ref 36.0–46.0)
Hemoglobin: 9.7 g/dL — ABNORMAL LOW (ref 12.0–15.0)
MCH: 29.1 pg (ref 26.0–34.0)
MCHC: 31.6 g/dL (ref 30.0–36.0)
MCV: 92.2 fL (ref 80.0–100.0)
Platelets: 194 K/uL (ref 150–400)
RBC: 3.33 MIL/uL — ABNORMAL LOW (ref 3.87–5.11)
RDW: 13.2 % (ref 11.5–15.5)
WBC: 6.9 K/uL (ref 4.0–10.5)
nRBC: 0 % (ref 0.0–0.2)

## 2024-08-12 LAB — GLUCOSE, CAPILLARY
Glucose-Capillary: 166 mg/dL — ABNORMAL HIGH (ref 70–99)
Glucose-Capillary: 261 mg/dL — ABNORMAL HIGH (ref 70–99)
Glucose-Capillary: 174 mg/dL — ABNORMAL HIGH (ref 70–99)
Glucose-Capillary: 154 mg/dL — ABNORMAL HIGH (ref 70–99)

## 2024-08-12 LAB — URINE CULTURE: Culture: <10000 — AB

## 2024-08-12 LAB — BASIC METABOLIC PANEL WITH GFR
Anion gap: 8 (ref 5–15)
BUN: 7 mg/dL (ref 6–20)
CO2: 28 mmol/L (ref 22–32)
Calcium: 9 mg/dL (ref 8.9–10.3)
Chloride: 105 mmol/L (ref 98–111)
Creatinine, Ser: 0.8 mg/dL (ref 0.44–1.00)
GFR, Estimated: >60 mL/min (ref >60)
Glucose, Bld: 119 mg/dL — ABNORMAL HIGH (ref 70–99)
Potassium: 3.4 mmol/L — ABNORMAL LOW (ref 3.5–5.1)
Sodium: 141 mmol/L (ref 135–145)

## 2024-08-12 MED ORDER — POTASSIUM CHLORIDE CRYS ER 20 MEQ PO TBCR
20.0000 meq | EXTENDED_RELEASE_TABLET | Freq: Once | ORAL | Status: AC
  Administered 2024-08-12: 20 meq via ORAL
  Filled 2024-08-12: qty 1

## 2024-08-12 MED ORDER — DOCUSATE SODIUM 100 MG PO CAPS
200.0000 mg | ORAL_CAPSULE | Freq: Two times a day (BID) | ORAL | Status: DC
  Administered 2024-08-12 – 2024-08-15 (×7): 200 mg via ORAL
  Filled 2024-08-12 (×7): qty 2

## 2024-08-12 MED ORDER — POLYETHYLENE GLYCOL 3350 17 G PO PACK
17.0000 g | PACK | Freq: Every day | ORAL | Status: DC
  Administered 2024-08-12 – 2024-08-15 (×4): 17 g via ORAL
  Filled 2024-08-12 (×4): qty 1

## 2024-08-12 MED ORDER — HYDROCODONE-ACETAMINOPHEN 7.5-325 MG PO TABS
2.0000 | ORAL_TABLET | Freq: Four times a day (QID) | ORAL | Status: DC | PRN
  Administered 2024-08-12 – 2024-08-14 (×5): 2 via ORAL
  Filled 2024-08-12 (×5): qty 2

## 2024-08-12 NOTE — Evaluation (Signed)
 Occupational Therapy Evaluation Patient Details Name: Alicia Mayo MRN: [id number] DOB: 11/09/1966 Today's Date: 08/12/2024   History of Present Illness   Alicia Mayo is a 58 y.o. female with medical history significant of bipolar, depression with anxiety, PTSD, multiple stroke with right-sided weakness, HTN, DM, COPD/asthma, obesity, gastroparesis, fibromyalgia on Subutex , UTI, who presented to hospital with lethargy, suprapubic abdominal pain. Patient was recently hospitalized from 9/24 - 9/29 due to altered mental status and UTI. She was re-admitted from home with UTI.     Clinical Impressions [name] presents with generalized weakness, limited endurance, fatigue, and pain. She lives alone in a handicapped-accessible apartment and has a personal care aide 7 days per week (8 hrs/day Mon-Fri; 4-5 hrs/day on weekends). She also states that she she has neighbors who check on her frequently. She reports primarily using a RW for ambulation, but also has a cane and WC and uses those PRN. During today's evaluation, she was able to complete bed mobility with Mod I (increased time and effort), required Min A for coming into standing from EOB, and ambulated with RW and close SUPV for safety, with heavy reliance on RW to maintain fair standing balance. Pain endorses 8/10 pain all over my body. When asked how long her pain had been that high, pt replied about 3 years. Pt appears somewhat debilitated, but likely not far from her PLOF. Recommend ongoing OT during her hospitalization, to improve strength and stamina. No further OT services are required post DC, given pt's good support system at home.     If plan is discharge home, recommend the following:   A little help with walking and/or transfers;A little help with bathing/dressing/bathroom;Help with stairs or ramp for entrance;Assistance with cooking/housework;Assist for transportation;Supervision due to cognitive status     Functional Status  Assessment   Patient has had a recent decline in their functional status and demonstrates the ability to make significant improvements in function in a reasonable and predictable amount of time.     Equipment Recommendations   None recommended by OT     Recommendations for Other Services         Precautions/Restrictions   Precautions Precautions: Fall Restrictions Weight Bearing Restrictions Per Provider Order: No     Mobility Bed Mobility Overal bed mobility: Modified Independent                  Transfers Overall transfer level: Needs assistance Equipment used: Rolling walker (2 wheels) Transfers: Sit to/from Stand, Bed to chair/wheelchair/BSC Sit to Stand: Min assist     Step pivot transfers: Supervision            Balance Overall balance assessment: Needs assistance Sitting-balance support: No upper extremity supported, Feet supported Sitting balance-Leahy Scale: Good     Standing balance support: During functional activity, Bilateral upper extremity supported, Reliant on assistive device for balance Standing balance-Leahy Scale: Fair                             ADL either performed or assessed with clinical judgement   ADL                                               Vision         Perception  Praxis         Pertinent Vitals/Pain Pain Assessment Pain Assessment: 0-10 Pain Score: 8  Pain Location: all over my body Pain Descriptors / Indicators: Aching, Discomfort, Grimacing Pain Intervention(s): Repositioned, Relaxation, Limited activity within patient's tolerance     Extremity/Trunk Assessment Upper Extremity Assessment Upper Extremity Assessment: Generalized weakness   Lower Extremity Assessment Lower Extremity Assessment: Generalized weakness   Cervical / Trunk Assessment Cervical / Trunk Assessment:  (mildly stooped in standing, forward head)   Communication  Communication Communication: No apparent difficulties   Cognition Arousal: Alert Behavior During Therapy: WFL for tasks assessed/performed Cognition: No family/caregiver present to determine baseline             OT - Cognition Comments: slow processing                 Following commands: Intact       Cueing  General Comments          Exercises     Shoulder Instructions      Home Living Family/patient expects to be discharged to:: Private residence Living Arrangements: Alone Available Help at Discharge: Personal care attendant;Available PRN/intermittently Type of Home: Apartment Home Access: Level entry     Home Layout: One level     Bathroom Shower/Tub: Tub/shower unit         Home Equipment: Agricultural consultant (2 wheels);Cane - single point;Shower seat;Electric scooter;BSC/3in1;Wheelchair - manual   Additional Comments: recently moved to handicapped accessable apartment.      Prior Functioning/Environment Prior Level of Function : Needs assist             Mobility Comments: Sup household distances with SPC vs RW. She states she can get in/out of bed mod I depending on the day. ADLs Comments: Pt has an aide that comes M-F for 8 hours (divided up during the day), Sat 5 hrs, and Sun 4 hours to assist with IADLs and ADL tasks. Pt endorses ambulation with cane, RW, or electric scooter depending on how she feels each day.    OT Problem List: Decreased strength;Impaired balance (sitting and/or standing);Decreased safety awareness;Decreased activity tolerance   OT Treatment/Interventions: Self-care/ADL training;Therapeutic exercise;Balance training;Therapeutic activities;DME and/or AE instruction      OT Goals(Current goals can be found in the care plan section)   Acute Rehab OT Goals Patient Stated Goal: to feel better OT Goal Formulation: With patient Time For Goal Achievement: 08/26/24 Potential to Achieve Goals: Good ADL Goals Pt Will  Perform Grooming: standing;with supervision Pt Will Perform Lower Body Dressing: with supervision;sitting/lateral leans;sit to/from stand Pt Will Transfer to Toilet: with supervision;ambulating;regular height toilet (w/ RW)   OT Frequency:  Min 2X/week    Co-evaluation              AM-PAC OT 6 Clicks Daily Activity     Outcome Measure Help from another person eating meals?: None Help from another person taking care of personal grooming?: A Little Help from another person toileting, which includes using toliet, bedpan, or urinal?: A Little Help from another person bathing (including washing, rinsing, drying)?: A Little Help from another person to put on and taking off regular upper body clothing?: None Help from another person to put on and taking off regular lower body clothing?: A Little 6 Click Score: 20   End of Session Equipment Utilized During Treatment: Rolling walker (2 wheels)  Activity Tolerance: Patient tolerated treatment well Patient left: with call bell/phone within reach;in chair;with chair alarm set;with nursing/sitter in  room  OT Visit Diagnosis: Unsteadiness on feet (R26.81);Muscle weakness (generalized) (M62.81)                Time: 8367-8354 OT Time Calculation (min): 13 min Charges:  OT General Charges $OT Visit: 1 Visit OT Evaluation $OT Eval Low Complexity: 1 Low Suzen Hock, PhD, MS, OTR/L 08/12/24, 5:06 PM

## 2024-08-12 NOTE — Progress Notes (Signed)
 PROGRESS NOTE    Alicia Mayo  FMW:[id number] DOB: Dec 21, 1965 DOA: 08/10/2024 PCP: Dante Elsie Celestia MADISON, MD    Assessment & Plan:   Principal Problem:   UTI (urinary tract infection) Active Problems:   Lethargy   AKI (acute kidney injury)   Diabetes mellitus with complication of gastroparesis   COPD (chronic obstructive pulmonary disease) (HCC)   History of stroke   Fibromyalgia   Bipolar II disorder (HCC)   Depression with anxiety   Essential hypertension   Obesity (BMI 30-39.9)  Assessment and Plan: UTI : failed outpatient keflex . Urine cx shows insignificant growth. Continue on IV rocephin    Lethargy: Likely due to UTI & polypharmacy secondary to multiple sedating meds. CT head negative. Pt requested to restart home dose of pregabalin     AKI: resolved   DM2: poor controlled, HbA1c 10.3. Continue on SSI w/ accuchecks   Hypokalemia: KCL ordered   Hx of COPD: w/o exacerbation. Bronchodilators prn    Hx of CVA: continue on statin    Fibromyalgia: continue on home dose of subutex    Bipolar II disorder: w/ depression & anxiety. Continue on home dose of lurasidone , invega , buspar, lexapro , cogentin , paliperidone    HTN: continue on home dose of metoprolol. Holding home dose of losartan    Obesity: BMI 33.6. Would benefit from weight loss        DVT prophylaxis: lovenox  Code Status: full  Family Communication:  Disposition Plan: depends on PT/OT recs   Status is: Inpatient Remains inpatient appropriate because: severity of illnes    Level of care: Med-Surg Consultants:    Procedures:   Antimicrobials: rocephin    Subjective: Pt c/o pain everywhere  Objective: Vitals:   08/11/24 1528 08/11/24 2015 08/12/24 0431 08/12/24 0742  BP: (!) 123/58 126/67 (!) 109/56 120/74  Pulse: 74 80 70 77  Resp: 20 18 18 16   Temp: 98.3 F (36.8 C) 98.2 F (36.8 C) 98.2 F (36.8 C) 98.2 F (36.8 C)  TempSrc: Oral   Oral  SpO2: 98% 95% 97% 95%  Weight:       Height:        Intake/Output Summary (Last 24 hours) at 08/12/2024 0839 Last data filed at 08/12/2024 0336 Gross per 24 hour  Intake 100 ml  Output --  Net 100 ml   Filed Weights   08/11/24 0022 08/11/24 0100  Weight: 98.4 kg 97.4 kg    Examination:  General exam: appears lethargic Respiratory system: clear breath sounds b/l  Cardiovascular system: S1/S2+. No rubs or clicks Gastrointestinal system: abd is soft, NT, obese & hypoactive bowel sounds. Central nervous system: alert & awake. Moves all extremities Psychiatry: judgement and insight appears at baseline. Flat mood and affect   Data Reviewed: I have personally reviewed following labs and imaging studies  CBC: Recent Labs  Lab 08/09/24 2113 08/10/24 2018 08/11/24 0453 08/12/24 0507  WBC 8.7 9.0 8.4 6.9  NEUTROABS 4.6 3.9  --   --   HGB 11.1* 11.1* 10.0* 9.7*  HCT 34.1* 35.1* 30.4* 30.7*  MCV 93.2 94.4 92.4 92.2  PLT 213 221 191 194   Basic Metabolic Panel: Recent Labs  Lab 08/09/24 2113 08/10/24 1730 08/11/24 0453 08/12/24 0507  NA 142 140 139 141  K 3.1* 4.2 3.1* 3.4*  CL 101 101 104 105  CO2 30 26 27 28   GLUCOSE 158* 114* 186* 119*  BUN 12 11 11 7   CREATININE 0.93 1.22* 0.88 0.80  CALCIUM  9.8 10.0 9.1 9.0   GFR: Estimated  Creatinine Clearance: 94.1 mL/min (by C-G formula based on SCr of 0.8 mg/dL). Liver Function Tests: Recent Labs  Lab 08/10/24 1730  AST 27  ALT 19  ALKPHOS 67  BILITOT 1.0  PROT 7.8  ALBUMIN 3.9   No results for input(s): LIPASE, AMYLASE in the last 168 hours. No results for input(s): AMMONIA in the last 168 hours. Coagulation Profile: No results for input(s): INR, PROTIME in the last 168 hours. Cardiac Enzymes: No results for input(s): CKTOTAL, CKMB, CKMBINDEX, TROPONINI in the last 168 hours. BNP (last 3 results) No results for input(s): PROBNP in the last 8760 hours. HbA1C: No results for input(s): HGBA1C in the last 72  hours. CBG: Recent Labs  Lab 08/11/24 0742 08/11/24 1249 08/11/24 1552 08/11/24 2018 08/12/24 0819  GLUCAP 164* 189* 155* 183* 166*   Lipid Profile: No results for input(s): CHOL, HDL, LDLCALC, TRIG, CHOLHDL, LDLDIRECT in the last 72 hours. Thyroid Function Tests: No results for input(s): TSH, T4TOTAL, FREET4, T3FREE, THYROIDAB in the last 72 hours. Anemia Panel: No results for input(s): VITAMINB12, FOLATE, FERRITIN, TIBC, IRON, RETICCTPCT in the last 72 hours. Sepsis Labs: No results for input(s): PROCALCITON, LATICACIDVEN in the last 168 hours.  Recent Results (from the past 240 hours)  Urine Culture (for pregnant, neutropenic or urologic patients or patients with an indwelling urinary catheter)     Status: Abnormal   Collection Time: 08/02/24  9:17 PM   Specimen: Urine, Clean Catch  Result Value Ref Range Status   Specimen Description   Final    URINE, CLEAN CATCH Performed at [facility], [address] [place], [place] [address]    Special Requests   Final    NONE Performed at [facility], [address], [place], [place] [address]    Culture (A)  Final    >=100,000 COLONIES/mL STAPHYLOCOCCUS EPIDERMIDIS CORRECTED RESULTS PREVIOUSLY REPORTED AS: GRAM NEGATIVE RODS CORRECTED RESULTS CALLED TO: EUSTACE JEST RN, AT 1250 08/04/24 D. VANHOOK Performed at [facility], [address], [place], [place] [address]    Report Status 08/05/2024 FINAL  Final   Organism ID, Bacteria STAPHYLOCOCCUS EPIDERMIDIS (A)  Final      Susceptibility   Staphylococcus epidermidis - MIC*    CIPROFLOXACIN <=0.5 SENSITIVE Sensitive     GENTAMICIN <=0.5 SENSITIVE Sensitive     NITROFURANTOIN <=16 SENSITIVE Sensitive     OXACILLIN <=0.25 SENSITIVE Sensitive     TETRACYCLINE 2 SENSITIVE Sensitive     VANCOMYCIN 1 SENSITIVE Sensitive     TRIMETH/SULFA <=10 SENSITIVE Sensitive     RIFAMPIN <=0.5 SENSITIVE Sensitive     Inducible  Clindamycin NEGATIVE Sensitive     * >=100,000 COLONIES/mL STAPHYLOCOCCUS EPIDERMIDIS  Blood culture (routine x 2)     Status: Abnormal   Collection Time: 08/03/24 12:05 AM   Specimen: BLOOD RIGHT FOREARM  Result Value Ref Range Status   Specimen Description   Final    BLOOD RIGHT FOREARM Performed at [facility], [address], [place], [place] [address]    Special Requests   Final    BOTTLES DRAWN AEROBIC ONLY Blood Culture adequate volume Performed at [facility], [address] [place], [place] [address]    Culture  Setup Time   Final    GRAM POSITIVE COCCI AEROBIC BOTTLE ONLY CRITICAL RESULT CALLED TO, READ BACK BY AND VERIFIED WITH: NATHAN PARKS @1950  ON 08/03/24 SKL    Culture (A)  Final    STAPHYLOCOCCUS HOMINIS THE SIGNIFICANCE  OF ISOLATING THIS ORGANISM FROM A SINGLE SET OF BLOOD CULTURES WHEN MULTIPLE SETS ARE DRAWN IS UNCERTAIN. PLEASE NOTIFY THE MICROBIOLOGY DEPARTMENT WITHIN ONE WEEK IF SPECIATION AND SENSITIVITIES ARE REQUIRED. Performed at [facility], [address] [place], [place] [address]    Report Status 08/06/2024 FINAL  Final  Blood Culture ID Panel (Reflexed)     Status: Abnormal   Collection Time: 08/03/24 12:05 AM  Result Value Ref Range Status   Enterococcus faecalis NOT DETECTED NOT DETECTED Final   Enterococcus Faecium NOT DETECTED NOT DETECTED Final   Listeria monocytogenes NOT DETECTED NOT DETECTED Final   Staphylococcus species DETECTED (A) NOT DETECTED Final    Comment: CRITICAL RESULT CALLED TO, READ BACK BY AND VERIFIED WITH: NATHAN PARKS @1950  ON 08/03/24 SKL    Staphylococcus aureus (BCID) NOT DETECTED NOT DETECTED Final   Staphylococcus epidermidis NOT DETECTED NOT DETECTED Final   Staphylococcus lugdunensis NOT DETECTED NOT DETECTED Final   Streptococcus species NOT DETECTED NOT DETECTED Final   Streptococcus agalactiae NOT DETECTED NOT DETECTED Final   Streptococcus pneumoniae NOT DETECTED NOT DETECTED Final    Streptococcus pyogenes NOT DETECTED NOT DETECTED Final   A.calcoaceticus-baumannii NOT DETECTED NOT DETECTED Final   Bacteroides fragilis NOT DETECTED NOT DETECTED Final   Enterobacterales NOT DETECTED NOT DETECTED Final   Enterobacter cloacae complex NOT DETECTED NOT DETECTED Final   Escherichia coli NOT DETECTED NOT DETECTED Final   Klebsiella aerogenes NOT DETECTED NOT DETECTED Final   Klebsiella oxytoca NOT DETECTED NOT DETECTED Final   Klebsiella pneumoniae NOT DETECTED NOT DETECTED Final   Proteus species NOT DETECTED NOT DETECTED Final   Salmonella species NOT DETECTED NOT DETECTED Final   Serratia marcescens NOT DETECTED NOT DETECTED Final   Haemophilus influenzae NOT DETECTED NOT DETECTED Final   Neisseria meningitidis NOT DETECTED NOT DETECTED Final   Pseudomonas aeruginosa NOT DETECTED NOT DETECTED Final   Stenotrophomonas maltophilia NOT DETECTED NOT DETECTED Final   Candida albicans NOT DETECTED NOT DETECTED Final   Candida auris NOT DETECTED NOT DETECTED Final   Candida glabrata NOT DETECTED NOT DETECTED Final   Candida krusei NOT DETECTED NOT DETECTED Final   Candida parapsilosis NOT DETECTED NOT DETECTED Final   Candida tropicalis NOT DETECTED NOT DETECTED Final   Cryptococcus neoformans/gattii NOT DETECTED NOT DETECTED Final    Comment: Performed at [facility], [address] [place], [place] [address]  Blood culture (routine x 2)     Status: None   Collection Time: 08/03/24 12:19 AM   Specimen: BLOOD LEFT HAND  Result Value Ref Range Status   Specimen Description BLOOD LEFT HAND  Final   Special Requests   Final    BOTTLES DRAWN AEROBIC ONLY Blood Culture results may not be optimal due to an inadequate volume of blood received in culture bottles   Culture   Final    NO GROWTH 5 DAYS Performed at [facility], [address] [place], [place] [address]    Report Status 08/08/2024 FINAL  Final  Urine Culture     Status: Abnormal   Collection  Time: 08/09/24  9:13 PM   Specimen: Urine, Clean Catch  Result Value Ref Range Status   Specimen Description   Final    URINE, CLEAN CATCH Performed at [facility], [address], [place], [place] [address]    Special Requests   Final    NONE Performed at [facility], [address] [place], [place] [address]  Culture MULTIPLE SPECIES PRESENT, SUGGEST RECOLLECTION (A)  Final   Report Status 08/11/2024 FINAL  Final  Resp panel by RT-PCR (RSV, Flu A&B, Covid) Anterior Nasal Swab     Status: None   Collection Time: 08/09/24  9:14 PM   Specimen: Anterior Nasal Swab  Result Value Ref Range Status   SARS Coronavirus 2 by RT PCR NEGATIVE NEGATIVE Final    Comment: (NOTE) SARS-CoV-2 target nucleic acids are NOT DETECTED.  The SARS-CoV-2 RNA is generally detectable in upper respiratory specimens during the acute phase of infection. The lowest concentration of SARS-CoV-2 viral copies this assay can detect is 138 copies/mL. A negative result does not preclude SARS-Cov-2 infection and should not be used as the sole basis for treatment or other patient management decisions. A negative result may occur with  improper specimen collection/handling, submission of specimen other than nasopharyngeal swab, presence of viral mutation(s) within the areas targeted by this assay, and inadequate number of viral copies(<138 copies/mL). A negative result must be combined with clinical observations, patient history, and epidemiological information. The expected result is Negative.  Fact Sheet for Patients:  BloggerCourse.com  Fact Sheet for Healthcare Providers:  SeriousBroker.it  This test is no t yet approved or cleared by the [place]  FDA and  has been authorized for detection and/or diagnosis of SARS-CoV-2 by FDA under an Emergency Use Authorization (EUA). This EUA will remain  in effect (meaning this test can be used)  for the duration of the COVID-19 declaration under Section 564(b)(1) of the Act, 21 U.S.C.section 360bbb-3(b)(1), unless the authorization is terminated  or revoked sooner.       Influenza A by PCR NEGATIVE NEGATIVE Final   Influenza B by PCR NEGATIVE NEGATIVE Final    Comment: (NOTE) The Xpert Xpress SARS-CoV-2/FLU/RSV plus assay is intended as an aid in the diagnosis of influenza from Nasopharyngeal swab specimens and should not be used as a sole basis for treatment. Nasal washings and aspirates are unacceptable for Xpert Xpress SARS-CoV-2/FLU/RSV testing.  Fact Sheet for Patients: BloggerCourse.com  Fact Sheet for Healthcare Providers: SeriousBroker.it  This test is not yet approved or cleared by the [place]  FDA and has been authorized for detection and/or diagnosis of SARS-CoV-2 by FDA under an Emergency Use Authorization (EUA). This EUA will remain in effect (meaning this test can be used) for the duration of the COVID-19 declaration under Section 564(b)(1) of the Act, 21 U.S.C. section 360bbb-3(b)(1), unless the authorization is terminated or revoked.     Resp Syncytial Virus by PCR NEGATIVE NEGATIVE Final    Comment: (NOTE) Fact Sheet for Patients: BloggerCourse.com  Fact Sheet for Healthcare Providers: SeriousBroker.it  This test is not yet approved or cleared by the [place]  FDA and has been authorized for detection and/or diagnosis of SARS-CoV-2 by FDA under an Emergency Use Authorization (EUA). This EUA will remain in effect (meaning this test can be used) for the duration of the COVID-19 declaration under Section 564(b)(1) of the Act, 21 U.S.C. section 360bbb-3(b)(1), unless the authorization is terminated or revoked.  Performed at [facility], [address], [place], [place] [address]          Radiology Studies: CT Head Wo  Contrast Result Date: 08/10/2024 EXAM: CT HEAD WITHOUT CONTRAST 08/10/2024 05:39:52 PM TECHNIQUE: CT of the head was performed without the administration of intravenous contrast. Automated exposure control, iterative reconstruction, and/or weight based adjustment of the mA/kV was utilized to reduce the radiation dose to as low as reasonably achievable.  COMPARISON: CT head 08/02/2024. CLINICAL HISTORY: ams. Table formatting from the original note was not included.; Triage note:; Pt BIB EMS from home with concerns from family that pt was to sleepy. Per EMS pt was seen in the the ED and dx with a UTI. Per Ems family was concerned that pt would not wake up enough talk or take her medications. Pt is snoring during triage. Pt ; asked by this RN if she knew why she was her she shook her head no. Pt was asked if she knew why family was concerned. Pt again shook her head no. FINDINGS: BRAIN AND VENTRICLES: No acute hemorrhage. No evidence of acute infarct. No hydrocephalus. No extra-axial collection. No mass effect or midline shift. ORBITS: No acute abnormality. SINUSES: Similar minimal mucosal thickening in the paranasal sinuses. SOFT TISSUES AND SKULL: No acute soft tissue abnormality. No skull fracture. IMPRESSION: 1. No acute intracranial abnormality. Electronically signed by: Donnice Mania MD 08/10/2024 06:01 PM EDT RP Workstation: [id number]        Scheduled Meds:  buprenorphine   2 mg Sublingual TID   busPIRone  15 mg Oral TID   enoxaparin  (LOVENOX ) injection  50 mg Subcutaneous Q24H   escitalopram   20 mg Oral Daily   fluticasone furoate-vilanterol  1 puff Inhalation Daily   insulin  aspart  0-5 Units Subcutaneous QHS   insulin  aspart  0-9 Units Subcutaneous TID WC   linaclotide  290 mcg Oral Daily   lurasidone   120 mg Oral Daily   metoprolol tartrate  50 mg Oral BID   nicotine  21 mg Transdermal Daily   paliperidone   6 mg Oral QHS   pantoprazole   40 mg Oral Daily   pregabalin   150 mg Oral BID    rosuvastatin   5 mg Oral Daily   Continuous Infusions:  cefTRIAXone  (ROCEPHIN )  IV Stopped (08/11/24 2214)     LOS: 1 day      Anthony CHRISTELLA Pouch, MD [facility] Pager [phone]  If 7PM-7AM, please contact night-coverage [email or web address] 08/12/2024, 8:39 AM

## 2024-08-12 NOTE — Evaluation (Signed)
 Physical Therapy Evaluation Patient Details Name: Alicia Mayo MRN: [id number] DOB: 09-28-1966 Today's Date: 08/12/2024  History of Present Illness  Alicia Mayo is a 58 y.o. female with medical history significant of bipolar, depression with anxiety, PTSD, multiple stroke with right-sided weakness, HTN, DM, COPD/asthma, obesity, gastroparesis, fibromyalgia on Subutex , UTI, who presented to hospital with lethargy, suprapubic abdominal pain. Patient was recently hospitalized from 9/24 - 9/29 due to altered mental status and UTI. She was re-admitted from home with UTI.   Clinical Impression  Patient received sitting in chair and agreeable to PT. She seemed a little drowsy but answered questions appropriately and was oriented to self, date, and generally to situation. She states since last hospitalization she moved to a handicapped accessible apartment with a level entry. She has assistance from a personal care aide 8 hours a day M-F, 5 hours on Sunday, and 4 hours on Saturday, for ADLs, IADLs and sometimes for mobility. Prior to hospitalization she reports using RW or power chair for mobility and needing help to get in/out of bed sometimes. Upon PT evaluation, she was mod I for bed mobility using handrail to help her roll, she needed supervision for transfers at bed, chair, and over commode toilet riser because she would forget to shift forwards enough, but she did not need physical assist. She ambulated 135 feet with RW with shuffling gait and supervision. Patient appears to have experienced a decline in functional independence and strength. Patient would benefit from skilled physical therapy to address impairments and functional limitations (see PT Problem List below) to work towards stated goals and return to PLOF or maximal functional independence.        If plan is discharge home, recommend the following: A little help with walking and/or transfers;A little help with  bathing/dressing/bathroom;Assist for transportation;Assistance with cooking/housework   Can travel by private vehicle    yes    Equipment Recommendations None recommended by PT  Recommendations for Other Services       Functional Status Assessment Patient has had a recent decline in their functional status and demonstrates the ability to make significant improvements in function in a reasonable and predictable amount of time.     Precautions / Restrictions Precautions Precautions: Fall Restrictions Weight Bearing Restrictions Per Provider Order: No      Mobility  Bed Mobility Overal bed mobility: Modified Independent Bed Mobility: Supine to Sit, Sit to Supine     Supine to sit: Used rails, Supervision Sit to supine: Supervision   General bed mobility comments: used railing to help roll when going supine to sit    Transfers Overall transfer level: Needs assistance Equipment used: Rolling walker (2 wheels) Transfers: Sit to/from Stand Sit to Stand: Contact guard assist           General transfer comment: Patient transfered from chair to bed to 3in1 BSC over toilet to chair. needed cuing for increased forwards shift from lower surfaces.    Ambulation/Gait Ambulation/Gait assistance: Supervision Gait Distance (Feet): 135 Feet Assistive device: Rolling walker (2 wheels) Gait Pattern/deviations: Shuffle, Decreased stride length, Trunk flexed Gait velocity: slow     General Gait Details: Pateint ambulated with slow shuffling gait with flexed trunk. She requested to return to room when fatigued and mentioned intermittant lightheadedness. She was able to talke slightly larger steps with cuing.  Stairs            Wheelchair Mobility     Tilt Bed    Modified Rankin (Stroke Patients  Only)       Balance Overall balance assessment: Needs assistance Sitting-balance support: No upper extremity supported, Feet supported Sitting balance-Leahy Scale: Good      Standing balance support: During functional activity, No upper extremity supported, Reliant on assistive device for balance, Bilateral upper extremity supported Standing balance-Leahy Scale: Fair Standing balance comment: Pateint able to wash her hands at the sink without holding on but needed B UE on RW for ambulation.                             Pertinent Vitals/Pain Pain Assessment Pain Assessment: 0-10 Pain Score: 8  Pain Location: everywhere Pain Descriptors / Indicators: Discomfort, Guarding Pain Intervention(s): Limited activity within patient's tolerance, Monitored during session, Patient requesting pain meds-RN notified, Repositioned    Home Living Family/patient expects to be discharged to:: Private residence Living Arrangements: Alone Available Help at Discharge: Personal care attendant;Available PRN/intermittently (M-F 8 hours split up, Sat 5hrs, Sun 6 hrs) Type of Home: Apartment (moved to handicaped accessable apartment since last hospitalization)         Home Layout: One level Home Equipment: Agricultural consultant (2 wheels);Cane - single point;Shower seat;Electric scooter;BSC/3in1 Additional Comments: recently moved to handicapped accessable apartment.    Prior Function Prior Level of Function : Needs assist             Mobility Comments: Sup household distances with SPC vs RW. She states she can get in/out of bed mod I depending on the day. ADLs Comments: Pt has an aide that comes M-F for 8 hours (divided up during the day), Sat 5 hrs, and Sun 4 hours to assist with IADLs and ADL tasks. Pt endorses ambulation with cane, RW, or electric scooter depending on how she feels each day.     Extremity/Trunk Assessment   Upper Extremity Assessment Upper Extremity Assessment: Generalized weakness    Lower Extremity Assessment Lower Extremity Assessment: Generalized weakness    Cervical / Trunk Assessment Cervical / Trunk Assessment:  (mildly stooped in  standing, forward head)  Communication   Communication Communication: No apparent difficulties    Cognition Arousal: Alert Behavior During Therapy: WFL for tasks assessed/performed   PT - Cognitive impairments: No family/caregiver present to determine baseline                       PT - Cognition Comments: Oriented to self, location and general situation; follows commands; pleasant and cooperative   Following commands impaired: Follows one step commands with increased time     Cueing Cueing Techniques: Verbal cues, Gestural cues, Tactile cues, Visual cues     General Comments      Exercises Other Exercises Other Exercises: pt educated on role of PT in acute care setting, safe mobility techniques.   Assessment/Plan    PT Assessment Patient needs continued PT services  PT Problem List Decreased strength;Decreased activity tolerance;Decreased balance;Decreased mobility;Decreased coordination;Decreased cognition;Decreased knowledge of use of DME;Decreased safety awareness;Decreased knowledge of precautions;Pain;Decreased range of motion       PT Treatment Interventions DME instruction;Gait training;Stair training;Functional mobility training;Therapeutic exercise;Therapeutic activities;Balance training;Patient/family education;Cognitive remediation;Neuromuscular re-education    PT Goals (Current goals can be found in the Care Plan section)       Frequency Min 2X/week     Co-evaluation               AM-PAC PT 6 Clicks Mobility  Outcome Measure Help needed turning from  your back to your side while in a flat bed without using bedrails?: A Little Help needed moving from lying on your back to sitting on the side of a flat bed without using bedrails?: None Help needed moving to and from a bed to a chair (including a wheelchair)?: A Little Help needed standing up from a chair using your arms (e.g., wheelchair or bedside chair)?: A Little Help needed to walk in  hospital room?: A Little Help needed climbing 3-5 steps with a railing? : A Little 6 Click Score: 19    End of Session Equipment Utilized During Treatment: Gait belt Activity Tolerance: Patient tolerated treatment well Patient left: in chair;with call bell/phone within reach;with chair alarm set Nurse Communication: Mobility status PT Visit Diagnosis: Muscle weakness (generalized) (M62.81);Difficulty in walking, not elsewhere classified (R26.2)    Time: 8593-8558 PT Time Calculation (min) (ACUTE ONLY): 35 min   Charges:   PT Evaluation $PT Eval Moderate Complexity: 1 Mod PT Treatments $Therapeutic Activity: 8-22 mins PT General Charges $$ ACUTE PT VISIT: 1 Visit        Camie R. Juli, PT, DPT, Cert. MDT 08/12/24, 3:06 PM

## 2024-08-13 DIAGNOSIS — N3000 Acute cystitis without hematuria: Secondary | ICD-10-CM | POA: Diagnosis not present

## 2024-08-13 LAB — CBC
HCT: 31.2 % — ABNORMAL LOW (ref 36.0–46.0)
Hemoglobin: 10.1 g/dL — ABNORMAL LOW (ref 12.0–15.0)
MCH: 29.8 pg (ref 26.0–34.0)
MCHC: 32.4 g/dL (ref 30.0–36.0)
MCV: 92 fL (ref 80.0–100.0)
Platelets: 199 K/uL (ref 150–400)
RBC: 3.39 MIL/uL — ABNORMAL LOW (ref 3.87–5.11)
RDW: 13 % (ref 11.5–15.5)
WBC: 7.3 K/uL (ref 4.0–10.5)
nRBC: 0 % (ref 0.0–0.2)

## 2024-08-13 LAB — BASIC METABOLIC PANEL WITH GFR
Anion gap: 10 (ref 5–15)
BUN: 9 mg/dL (ref 6–20)
CO2: 28 mmol/L (ref 22–32)
Calcium: 9.1 mg/dL (ref 8.9–10.3)
Chloride: 102 mmol/L (ref 98–111)
Creatinine, Ser: 0.93 mg/dL (ref 0.44–1.00)
GFR, Estimated: >60 mL/min (ref >60)
Glucose, Bld: 144 mg/dL — ABNORMAL HIGH (ref 70–99)
Potassium: 3.5 mmol/L (ref 3.5–5.1)
Sodium: 140 mmol/L (ref 135–145)

## 2024-08-13 LAB — GLUCOSE, CAPILLARY
Glucose-Capillary: 133 mg/dL — ABNORMAL HIGH (ref 70–99)
Glucose-Capillary: 186 mg/dL — ABNORMAL HIGH (ref 70–99)
Glucose-Capillary: 175 mg/dL — ABNORMAL HIGH (ref 70–99)
Glucose-Capillary: 148 mg/dL — ABNORMAL HIGH (ref 70–99)

## 2024-08-13 NOTE — Progress Notes (Signed)
 PROGRESS NOTE    Alicia Mayo  FMW:[id number] DOB: 10/31/1966 DOA: 08/10/2024 PCP: Dante Elsie Celestia MADISON, MD    Assessment & Plan:   Principal Problem:   UTI (urinary tract infection) Active Problems:   Lethargy   AKI (acute kidney injury)   Diabetes mellitus with complication of gastroparesis   COPD (chronic obstructive pulmonary disease) (HCC)   History of stroke   Fibromyalgia   Bipolar II disorder (HCC)   Depression with anxiety   Essential hypertension   Obesity (BMI 30-39.9)  Assessment and Plan: UTI : failed outpatient keflex . Urine cx shows insignificant growth. Continue on IV rocephin  x 3 days and will stop abx after today's dose   Lethargy: Likely due to UTI & polypharmacy secondary to multiple sedating meds. CT head negative. Pt requested to restart home dose of pregabalin . Much improved    AKI: resolved   DM2: poor controlled, HbA1c 10.3. Continue on SSI w/ accuchecks  Hypokalemia: WNL today    Hx of COPD: w/o exacerbation. Bronchodilators prn    Hx of CVA: continue on statin    Fibromyalgia: continue on home dose of subutex    Bipolar II disorder: w/ depression & anxiety. Continue on home dose of lurasidone , invega , buspar, lexapro , cogentin , paliperidone    HTN: continue on home dose of metoprolol. Holding home dose of losartan   Obesity: BMI 33.6. Would benefit from weight loss         DVT prophylaxis: lovenox  Code Status: full  Family Communication: discussed pt's care w/ pt's caregiver, [name], and answered his questions  Disposition Plan: d/c back home w/ home health   Status is: Inpatient Remains inpatient appropriate because: severity of illness    Level of care: Med-Surg Consultants:    Procedures:   Antimicrobials: rocephin    Subjective: Pt c/o fatigue  Objective: Vitals:   08/12/24 1645 08/12/24 2008 08/13/24 0415 08/13/24 0759  BP: 110/72 121/60 (!) 137/59 (!) 109/57  Pulse: 69 68 65 60  Resp: 16 18 18 16   Temp:  98.6 F (37 C) 97.6 F (36.4 C) 98.2 F (36.8 C) 98 F (36.7 C)  TempSrc: Oral Oral    SpO2: 100% 97% 97% 97%  Weight:      Height:        Intake/Output Summary (Last 24 hours) at 08/13/2024 0842 Last data filed at 08/13/2024 0300 Gross per 24 hour  Intake 340 ml  Output --  Net 340 ml   Filed Weights   08/11/24 0022 08/11/24 0100  Weight: 98.4 kg 97.4 kg    Examination:  General exam: appears calm & comfortable  Respiratory system: clear breath sounds b/l  Cardiovascular system: S1 & S2+. No rubs or clicks  Gastrointestinal system: abd is soft, NT, obese & hypoactive bowel sounds  Central nervous system: alert & awake. Moves all extremities  Psychiatry: judgement and insight appears at baseline. Flat mood and affect   Data Reviewed: I have personally reviewed following labs and imaging studies  CBC: Recent Labs  Lab 08/09/24 2113 08/10/24 2018 08/11/24 0453 08/12/24 0507 08/13/24 0331  WBC 8.7 9.0 8.4 6.9 7.3  NEUTROABS 4.6 3.9  --   --   --   HGB 11.1* 11.1* 10.0* 9.7* 10.1*  HCT 34.1* 35.1* 30.4* 30.7* 31.2*  MCV 93.2 94.4 92.4 92.2 92.0  PLT 213 221 191 194 199   Basic Metabolic Panel: Recent Labs  Lab 08/09/24 2113 08/10/24 1730 08/11/24 0453 08/12/24 0507 08/13/24 0331  NA 142 140 139 141 140  K 3.1* 4.2 3.1* 3.4* 3.5  CL 101 101 104 105 102  CO2 30 26 27 28 28   GLUCOSE 158* 114* 186* 119* 144*  BUN 12 11 11 7 9   CREATININE 0.93 1.22* 0.88 0.80 0.93  CALCIUM  9.8 10.0 9.1 9.0 9.1   GFR: Estimated Creatinine Clearance: 80.9 mL/min (by C-G formula based on SCr of 0.93 mg/dL). Liver Function Tests: Recent Labs  Lab 08/10/24 1730  AST 27  ALT 19  ALKPHOS 67  BILITOT 1.0  PROT 7.8  ALBUMIN 3.9   No results for input(s): LIPASE, AMYLASE in the last 168 hours. No results for input(s): AMMONIA in the last 168 hours. Coagulation Profile: No results for input(s): INR, PROTIME in the last 168 hours. Cardiac Enzymes: No results for  input(s): CKTOTAL, CKMB, CKMBINDEX, TROPONINI in the last 168 hours. BNP (last 3 results) No results for input(s): PROBNP in the last 8760 hours. HbA1C: No results for input(s): HGBA1C in the last 72 hours. CBG: Recent Labs  Lab 08/12/24 0819 08/12/24 1213 08/12/24 1731 08/12/24 2012 08/13/24 0800  GLUCAP 166* 261* 174* 154* 133*   Lipid Profile: No results for input(s): CHOL, HDL, LDLCALC, TRIG, CHOLHDL, LDLDIRECT in the last 72 hours. Thyroid Function Tests: No results for input(s): TSH, T4TOTAL, FREET4, T3FREE, THYROIDAB in the last 72 hours. Anemia Panel: No results for input(s): VITAMINB12, FOLATE, FERRITIN, TIBC, IRON, RETICCTPCT in the last 72 hours. Sepsis Labs: No results for input(s): PROCALCITON, LATICACIDVEN in the last 168 hours.  Recent Results (from the past 240 hours)  Urine Culture     Status: Abnormal   Collection Time: 08/09/24  9:13 PM   Specimen: Urine, Clean Catch  Result Value Ref Range Status   Specimen Description   Final    URINE, CLEAN CATCH Performed at [facility], [address], [place], [place] [address]    Special Requests   Final    NONE Performed at [facility], [address] [place], [place] [address]    Culture MULTIPLE SPECIES PRESENT, SUGGEST RECOLLECTION (A)  Final   Report Status 08/11/2024 FINAL  Final  Resp panel by RT-PCR (RSV, Flu A&B, Covid) Anterior Nasal Swab     Status: None   Collection Time: 08/09/24  9:14 PM   Specimen: Anterior Nasal Swab  Result Value Ref Range Status   SARS Coronavirus 2 by RT PCR NEGATIVE NEGATIVE Final    Comment: (NOTE) SARS-CoV-2 target nucleic acids are NOT DETECTED.  The SARS-CoV-2 RNA is generally detectable in upper respiratory specimens during the acute phase of infection. The lowest concentration of SARS-CoV-2 viral copies this assay can detect is 138 copies/mL. A negative result does not preclude  SARS-Cov-2 infection and should not be used as the sole basis for treatment or other patient management decisions. A negative result may occur with  improper specimen collection/handling, submission of specimen other than nasopharyngeal swab, presence of viral mutation(s) within the areas targeted by this assay, and inadequate number of viral copies(<138 copies/mL). A negative result must be combined with clinical observations, patient history, and epidemiological information. The expected result is Negative.  Fact Sheet for Patients:  BloggerCourse.com  Fact Sheet for Healthcare Providers:  SeriousBroker.it  This test is no t yet approved or cleared by the [place]  FDA and  has been authorized for detection and/or diagnosis of SARS-CoV-2 by FDA under an Emergency Use Authorization (EUA). This EUA will remain  in effect (meaning this test can be used) for the duration of  the COVID-19 declaration under Section 564(b)(1) of the Act, 21 U.S.C.section 360bbb-3(b)(1), unless the authorization is terminated  or revoked sooner.       Influenza A by PCR NEGATIVE NEGATIVE Final   Influenza B by PCR NEGATIVE NEGATIVE Final    Comment: (NOTE) The Xpert Xpress SARS-CoV-2/FLU/RSV plus assay is intended as an aid in the diagnosis of influenza from Nasopharyngeal swab specimens and should not be used as a sole basis for treatment. Nasal washings and aspirates are unacceptable for Xpert Xpress SARS-CoV-2/FLU/RSV testing.  Fact Sheet for Patients: BloggerCourse.com  Fact Sheet for Healthcare Providers: SeriousBroker.it  This test is not yet approved or cleared by the [place]  FDA and has been authorized for detection and/or diagnosis of SARS-CoV-2 by FDA under an Emergency Use Authorization (EUA). This EUA will remain in effect (meaning this test can be used) for the duration of  the COVID-19 declaration under Section 564(b)(1) of the Act, 21 U.S.C. section 360bbb-3(b)(1), unless the authorization is terminated or revoked.     Resp Syncytial Virus by PCR NEGATIVE NEGATIVE Final    Comment: (NOTE) Fact Sheet for Patients: BloggerCourse.com  Fact Sheet for Healthcare Providers: SeriousBroker.it  This test is not yet approved or cleared by the [place]  FDA and has been authorized for detection and/or diagnosis of SARS-CoV-2 by FDA under an Emergency Use Authorization (EUA). This EUA will remain in effect (meaning this test can be used) for the duration of the COVID-19 declaration under Section 564(b)(1) of the Act, 21 U.S.C. section 360bbb-3(b)(1), unless the authorization is terminated or revoked.  Performed at [facility], [address] [place], [place] [address]   Urine Culture     Status: Abnormal   Collection Time: 08/11/24  2:30 AM   Specimen: Urine, Catheterized  Result Value Ref Range Status   Specimen Description   Final    URINE, CATHETERIZED Performed at [facility], [address], [place], [place] [address]    Special Requests   Final    NONE Performed at [facility], [address] [place], [place] [address]    Culture (A)  Final    <10,000 COLONIES/mL INSIGNIFICANT GROWTH Performed at [facility], [address], [place], [place] [address]    Report Status 08/12/2024 FINAL  Final         Radiology Studies: No results found.       Scheduled Meds:  buprenorphine   2 mg Sublingual TID   busPIRone  15 mg Oral TID   docusate sodium  200 mg Oral BID   enoxaparin  (LOVENOX ) injection  50 mg Subcutaneous Q24H   escitalopram   20 mg Oral Daily   fluticasone furoate-vilanterol  1 puff Inhalation Daily   insulin  aspart  0-5 Units Subcutaneous QHS   insulin  aspart  0-9 Units Subcutaneous TID WC   linaclotide  290 mcg Oral Daily    lurasidone   120 mg Oral Daily   metoprolol tartrate  50 mg Oral BID   nicotine  21 mg Transdermal Daily   paliperidone   6 mg Oral QHS   pantoprazole   40 mg Oral Daily   polyethylene glycol  17 g Oral Daily   pregabalin   150 mg Oral BID   rosuvastatin   5 mg Oral Daily   Continuous Infusions:  cefTRIAXone  (ROCEPHIN )  IV 1 g (08/13/24 0024)     LOS: 2 days      Anthony CHRISTELLA Pouch, MD [facility] Pager [phone]  If 7PM-7AM, please contact  night-coverage [email or web address] 08/13/2024, 8:42 AM

## 2024-08-13 NOTE — Plan of Care (Signed)

## 2024-08-13 NOTE — TOC Initial Note (Signed)
 Transition of Care [facility] - Progression Note    Patient Details  Name: Alicia Mayo MRN: [id number] Date of Birth: 16-Aug-1966  [facility] CM/SW Contact  [place] Jashawn [place], [place] Phone Number: 08/13/2024, 12:18 PM  Clinical Narrative:      CSW spoke with patient regarding HH recommendations. Patient received services from [facility] in the past. Patient was agreeable to working with [facility] again.   Patient has some speech issues. CSW contacted her caregiver, Mr. Rosalynn. Mr. Rosalynn advised that he takes care of patient 8 hours a day. He stated that patient lives alone. She has a walker, cane and wheelchair at home.                     Expected Discharge Plan and Services                                               Social Drivers of Health (SDOH) Interventions SDOH Screenings   Food Insecurity: No Food Insecurity (08/11/2024)  Housing: Low Risk  (08/11/2024)  Transportation Needs: No Transportation Needs (08/11/2024)  Utilities: Not At Risk (08/11/2024)  Financial Resource Strain: Low Risk  (08/31/2023)   Received from [facility]  Physical Activity: Inactive (11/03/2022)   Received from [facility]  Social Connections: Socially Isolated (11/03/2022)   Received from [facility]  Stress: No Stress Concern Present (11/03/2022)   Received from [facility]  Tobacco Use: High Risk (08/09/2024)    Readmission Risk Interventions     No data to display

## 2024-08-14 LAB — CBC
HCT: 33 % — ABNORMAL LOW (ref 36.0–46.0)
Hemoglobin: 10.7 g/dL — ABNORMAL LOW (ref 12.0–15.0)
MCH: 30 pg (ref 26.0–34.0)
MCHC: 32.4 g/dL (ref 30.0–36.0)
MCV: 92.4 fL (ref 80.0–100.0)
Platelets: 215 K/uL (ref 150–400)
RBC: 3.57 MIL/uL — ABNORMAL LOW (ref 3.87–5.11)
RDW: 13 % (ref 11.5–15.5)
WBC: 7.9 K/uL (ref 4.0–10.5)
nRBC: 0 % (ref 0.0–0.2)

## 2024-08-14 LAB — GLUCOSE, CAPILLARY
Glucose-Capillary: 162 mg/dL — ABNORMAL HIGH (ref 70–99)
Glucose-Capillary: 174 mg/dL — ABNORMAL HIGH (ref 70–99)
Glucose-Capillary: 196 mg/dL — ABNORMAL HIGH (ref 70–99)
Glucose-Capillary: 199 mg/dL — ABNORMAL HIGH (ref 70–99)
Glucose-Capillary: 210 mg/dL — ABNORMAL HIGH (ref 70–99)

## 2024-08-14 LAB — BASIC METABOLIC PANEL WITH GFR
Anion gap: 9 (ref 5–15)
BUN: 14 mg/dL (ref 6–20)
CO2: 28 mmol/L (ref 22–32)
Calcium: 9.3 mg/dL (ref 8.9–10.3)
Chloride: 102 mmol/L (ref 98–111)
Creatinine, Ser: 0.98 mg/dL (ref 0.44–1.00)
GFR, Estimated: >60 mL/min (ref >60)
Glucose, Bld: 259 mg/dL — ABNORMAL HIGH (ref 70–99)
Potassium: 3.8 mmol/L (ref 3.5–5.1)
Sodium: 139 mmol/L (ref 135–145)

## 2024-08-14 NOTE — Progress Notes (Signed)
 Dr Trudy notified of increased patient lethargy contributing to refusal of Buspar and Subutex .

## 2024-08-14 NOTE — Care Management Important Message (Signed)
 Important Message  Patient Details  Name: Alicia Mayo MRN: [id number] Date of Birth: [birth year]   Important Message Given:  Yes - Medicare IM     Dameer Speiser W, CMA 08/14/2024, 3:17 PM

## 2024-08-14 NOTE — Progress Notes (Addendum)
 PROGRESS NOTE    Alicia Mayo  FMW:[id number] DOB: 1966/07/20 DOA: 08/10/2024 PCP: Dante Elsie Celestia MADISON, MD    Assessment & Plan:   Principal Problem:   UTI (urinary tract infection) Active Problems:   Lethargy   AKI (acute kidney injury)   Diabetes mellitus with complication of gastroparesis   COPD (chronic obstructive pulmonary disease) (HCC)   History of stroke   Fibromyalgia   Bipolar II disorder (HCC)   Depression with anxiety   Essential hypertension   Obesity (BMI 30-39.9)  Assessment and Plan: UTI : failed outpatient keflex . Urine cx shows insignificant growth. Completed abx course   Lethargy: Likely due to UTI & polypharmacy secondary to multiple sedating meds. CT head negative. Pt requested to restart home dose of pregabalin . Much improved   Nausea & vomiting: x 1 this afternoon after taking a walk in the hallway. Zofran  prn    AKI: resolved   DM2: poor controlled, HbA1c 10.3. Continue on SSI w/ accuchecks  Hypokalemia: WNL today    Hx of COPD: w/o exacerbation. Bronchodilators prn    Hx of CVA: continue on statin    Fibromyalgia: continue on home dose of subutex    Bipolar II disorder: w/ depression & anxiety. Continue on home dose of lurasidone , invega , buspar, lexapro , cogentin , paliperidone    HTN: continue on home dose of metoprolol. Holding home dose of losartan   Obesity: BMI 33.6. Would benefit from weight loss         DVT prophylaxis: lovenox  Code Status: full  Family Communication: discussed pt's care w/ pt's caregiver, [name], and answered his questions  Disposition Plan: d/c back home w/ home health   Status is: Inpatient Remains inpatient appropriate because: d/c held secondary to pt vomiting today     Level of care: Med-Surg Consultants:    Procedures:   Antimicrobials:   Subjective: Pt c/o nausea & vomiting this afternoon but no complaints in the morning   Objective: Vitals:   08/13/24 2050 08/14/24 0417 08/14/24  1625 08/14/24 1627  BP: 125/70 123/62 (!) 94/50 (!) 95/54  Pulse: 75 76 60 60  Resp:   18   Temp: 98 F (36.7 C) 98.2 F (36.8 C) 97.8 F (36.6 C)   TempSrc: Oral     SpO2: 99% 98% 98%   Weight:      Height:       No intake or output data in the 24 hours ending 08/14/24 1637  Filed Weights   08/11/24 0022 08/11/24 0100  Weight: 98.4 kg 97.4 kg    Examination: PE was conducted in AM.  General exam: appears comfortable  Respiratory system: clear breath sounds b/l  Cardiovascular system: S1/S2+. No rubs or clicks  Gastrointestinal system: abd is soft, NT,obese & hypoactive bowel sounds Central nervous system: alert & awake. Moves all extremities  Psychiatry: judgement and insight appears at baseline. Flat mood and affect    Data Reviewed: I have personally reviewed following labs and imaging studies  CBC: Recent Labs  Lab 08/09/24 2113 08/10/24 2018 08/11/24 0453 08/12/24 0507 08/13/24 0331 08/14/24 0542  WBC 8.7 9.0 8.4 6.9 7.3 7.9  NEUTROABS 4.6 3.9  --   --   --   --   HGB 11.1* 11.1* 10.0* 9.7* 10.1* 10.7*  HCT 34.1* 35.1* 30.4* 30.7* 31.2* 33.0*  MCV 93.2 94.4 92.4 92.2 92.0 92.4  PLT 213 221 191 194 199 215   Basic Metabolic Panel: Recent Labs  Lab 08/10/24 1730 08/11/24 0453 08/12/24 0507 08/13/24 0331  08/14/24 0542  NA 140 139 141 140 139  K 4.2 3.1* 3.4* 3.5 3.8  CL 101 104 105 102 102  CO2 26 27 28 28 28   GLUCOSE 114* 186* 119* 144* 259*  BUN 11 11 7 9 14   CREATININE 1.22* 0.88 0.80 0.93 0.98  CALCIUM  10.0 9.1 9.0 9.1 9.3   GFR: Estimated Creatinine Clearance: 76.8 mL/min (by C-G formula based on SCr of 0.98 mg/dL). Liver Function Tests: Recent Labs  Lab 08/10/24 1730  AST 27  ALT 19  ALKPHOS 67  BILITOT 1.0  PROT 7.8  ALBUMIN 3.9   No results for input(s): LIPASE, AMYLASE in the last 168 hours. No results for input(s): AMMONIA in the last 168 hours. Coagulation Profile: No results for input(s): INR, PROTIME in the last  168 hours. Cardiac Enzymes: No results for input(s): CKTOTAL, CKMB, CKMBINDEX, TROPONINI in the last 168 hours. BNP (last 3 results) No results for input(s): PROBNP in the last 8760 hours. HbA1C: No results for input(s): HGBA1C in the last 72 hours. CBG: Recent Labs  Lab 08/13/24 1801 08/13/24 2053 08/14/24 0924 08/14/24 1118 08/14/24 1627  GLUCAP 175* 148* 174* 196* 162*   Lipid Profile: No results for input(s): CHOL, HDL, LDLCALC, TRIG, CHOLHDL, LDLDIRECT in the last 72 hours. Thyroid Function Tests: No results for input(s): TSH, T4TOTAL, FREET4, T3FREE, THYROIDAB in the last 72 hours. Anemia Panel: No results for input(s): VITAMINB12, FOLATE, FERRITIN, TIBC, IRON, RETICCTPCT in the last 72 hours. Sepsis Labs: No results for input(s): PROCALCITON, LATICACIDVEN in the last 168 hours.  Recent Results (from the past 240 hours)  Urine Culture     Status: Abnormal   Collection Time: 08/09/24  9:13 PM   Specimen: Urine, Clean Catch  Result Value Ref Range Status   Specimen Description   Final    URINE, CLEAN CATCH Performed at [facility], [address], [place], [place] [address]    Special Requests   Final    NONE Performed at [facility], [address] [place], [place] [address]    Culture MULTIPLE SPECIES PRESENT, SUGGEST RECOLLECTION (A)  Final   Report Status 08/11/2024 FINAL  Final  Resp panel by RT-PCR (RSV, Flu A&B, Covid) Anterior Nasal Swab     Status: None   Collection Time: 08/09/24  9:14 PM   Specimen: Anterior Nasal Swab  Result Value Ref Range Status   SARS Coronavirus 2 by RT PCR NEGATIVE NEGATIVE Final    Comment: (NOTE) SARS-CoV-2 target nucleic acids are NOT DETECTED.  The SARS-CoV-2 RNA is generally detectable in upper respiratory specimens during the acute phase of infection. The lowest concentration of SARS-CoV-2 viral copies this assay can detect is 138 copies/mL. A  negative result does not preclude SARS-Cov-2 infection and should not be used as the sole basis for treatment or other patient management decisions. A negative result may occur with  improper specimen collection/handling, submission of specimen other than nasopharyngeal swab, presence of viral mutation(s) within the areas targeted by this assay, and inadequate number of viral copies(<138 copies/mL). A negative result must be combined with clinical observations, patient history, and epidemiological information. The expected result is Negative.  Fact Sheet for Patients:  BloggerCourse.com  Fact Sheet for Healthcare Providers:  SeriousBroker.it  This test is no t yet approved or cleared by the [place]  FDA and  has been authorized for detection and/or diagnosis of SARS-CoV-2 by FDA under an Emergency Use Authorization (EUA). This EUA will remain  in effect (  meaning this test can be used) for the duration of the COVID-19 declaration under Section 564(b)(1) of the Act, 21 U.S.C.section 360bbb-3(b)(1), unless the authorization is terminated  or revoked sooner.       Influenza A by PCR NEGATIVE NEGATIVE Final   Influenza B by PCR NEGATIVE NEGATIVE Final    Comment: (NOTE) The Xpert Xpress SARS-CoV-2/FLU/RSV plus assay is intended as an aid in the diagnosis of influenza from Nasopharyngeal swab specimens and should not be used as a sole basis for treatment. Nasal washings and aspirates are unacceptable for Xpert Xpress SARS-CoV-2/FLU/RSV testing.  Fact Sheet for Patients: BloggerCourse.com  Fact Sheet for Healthcare Providers: SeriousBroker.it  This test is not yet approved or cleared by the [place]  FDA and has been authorized for detection and/or diagnosis of SARS-CoV-2 by FDA under an Emergency Use Authorization (EUA). This EUA will remain in effect (meaning this test can  be used) for the duration of the COVID-19 declaration under Section 564(b)(1) of the Act, 21 U.S.C. section 360bbb-3(b)(1), unless the authorization is terminated or revoked.     Resp Syncytial Virus by PCR NEGATIVE NEGATIVE Final    Comment: (NOTE) Fact Sheet for Patients: BloggerCourse.com  Fact Sheet for Healthcare Providers: SeriousBroker.it  This test is not yet approved or cleared by the [place]  FDA and has been authorized for detection and/or diagnosis of SARS-CoV-2 by FDA under an Emergency Use Authorization (EUA). This EUA will remain in effect (meaning this test can be used) for the duration of the COVID-19 declaration under Section 564(b)(1) of the Act, 21 U.S.C. section 360bbb-3(b)(1), unless the authorization is terminated or revoked.  Performed at [facility], [address], [place], [place] [address]   Urine Culture     Status: Abnormal   Collection Time: 08/11/24  2:30 AM   Specimen: Urine, Catheterized  Result Value Ref Range Status   Specimen Description   Final    URINE, CATHETERIZED Performed at [facility], [address] [place], [place] [address]    Special Requests   Final    NONE Performed at [facility], [address] [place], [place] [address]    Culture (A)  Final    <10,000 COLONIES/mL INSIGNIFICANT GROWTH Performed at [facility], [address], [place], [place] [address]    Report Status 08/12/2024 FINAL  Final         Radiology Studies: No results found.       Scheduled Meds:  buprenorphine   2 mg Sublingual TID   busPIRone  15 mg Oral TID   docusate sodium  200 mg Oral BID   enoxaparin  (LOVENOX ) injection  50 mg Subcutaneous Q24H   escitalopram   20 mg Oral Daily   fluticasone furoate-vilanterol  1 puff Inhalation Daily   insulin  aspart  0-5 Units Subcutaneous QHS   insulin  aspart  0-9 Units Subcutaneous TID WC    linaclotide  290 mcg Oral Daily   lurasidone   120 mg Oral Daily   metoprolol tartrate  50 mg Oral BID   nicotine  21 mg Transdermal Daily   paliperidone   6 mg Oral QHS   pantoprazole   40 mg Oral Daily   polyethylene glycol  17 g Oral Daily   pregabalin   150 mg Oral BID   rosuvastatin   5 mg Oral Daily   Continuous Infusions:     LOS: 3 days      Anthony CHRISTELLA Pouch, MD [facility] Pager [phone]  If 7PM-7AM, please  contact night-coverage [email or web address] 08/14/2024, 4:37 PM

## 2024-08-14 NOTE — Progress Notes (Signed)
 Mobility Specialist Progress Note:    08/14/24 0819  Mobility  Activity Ambulated with assistance  Level of Assistance Contact guard assist, steadying assist  Assistive Device Front wheel walker  Distance Ambulated (ft) 220 ft  Range of Motion/Exercises Active;All extremities  Activity Response Tolerated well  Mobility visit 1 Mobility  Mobility Specialist Start Time (ACUTE ONLY) 0802  Mobility Specialist Stop Time (ACUTE ONLY) [id number]  Mobility Specialist Time Calculation (min) (ACUTE ONLY) 17 min   Pt received in chair, agreeable to mobility. Required CGA to stand and ambulate with RW. Tolerated well, asx throughout. Returned to chair, alarm on and belongings in reach. All needs met.   Sherrilee Ditty Mobility Specialist Please contact via Special educational needs teacher or  Rehab office at [phone]

## 2024-08-14 NOTE — Plan of Care (Signed)
   Problem: Coping: Goal: Ability to adjust to condition or change in health will improve Outcome: Progressing   Problem: Fluid Volume: Goal: Ability to maintain a balanced intake and output will improve Outcome: Progressing   Problem: Metabolic: Goal: Ability to maintain appropriate glucose levels will improve Outcome: Progressing

## 2024-08-15 DIAGNOSIS — N390 Urinary tract infection, site not specified: Principal | ICD-10-CM

## 2024-08-15 LAB — BASIC METABOLIC PANEL WITH GFR
Anion gap: 6 (ref 5–15)
BUN: 13 mg/dL (ref 6–20)
CO2: 29 mmol/L (ref 22–32)
Calcium: 9 mg/dL (ref 8.9–10.3)
Chloride: 101 mmol/L (ref 98–111)
Creatinine, Ser: 0.86 mg/dL (ref 0.44–1.00)
GFR, Estimated: >60 mL/min (ref >60)
Glucose, Bld: 203 mg/dL — ABNORMAL HIGH (ref 70–99)
Potassium: 3.6 mmol/L (ref 3.5–5.1)
Sodium: 136 mmol/L (ref 135–145)

## 2024-08-15 LAB — CBC
HCT: 32.1 % — ABNORMAL LOW (ref 36.0–46.0)
Hemoglobin: 10.5 g/dL — ABNORMAL LOW (ref 12.0–15.0)
MCH: 30 pg (ref 26.0–34.0)
MCHC: 32.7 g/dL (ref 30.0–36.0)
MCV: 91.7 fL (ref 80.0–100.0)
Platelets: 215 K/uL (ref 150–400)
RBC: 3.5 MIL/uL — ABNORMAL LOW (ref 3.87–5.11)
RDW: 13.1 % (ref 11.5–15.5)
WBC: 7.1 K/uL (ref 4.0–10.5)
nRBC: 0 % (ref 0.0–0.2)

## 2024-08-15 LAB — GLUCOSE, CAPILLARY: Glucose-Capillary: 161 mg/dL — ABNORMAL HIGH (ref 70–99)

## 2024-08-15 NOTE — Discharge Summary (Signed)
 Physician Discharge Summary  Alicia Mayo FMW:[id number] DOB: 1966-05-05 DOA: 08/10/2024  PCP: Dante Elsie Celestia MADISON, MD  Admit date: 08/10/2024 Discharge date: 08/15/2024  Admitted From: home  Disposition:  home w/ home health   Recommendations for Outpatient Follow-up:  Follow up with PCP in 1-2 weeks F/u w/ psych within 1 week to possibly reduce medication burden (polypharmacy)   Home Health: yes Equipment/Devices:  Discharge Condition: stable CODE STATUS: full  Diet recommendation: Heart Healthy / Carb Modified  Brief/Interim Summary: HPI was taken from Dr. Hilma: Alicia Mayo is a 58 y.o. female with medical history significant of bipolar, depression with anxiety, PTSD, multiple stroke with right-sided weakness, HTN, DM, COPD/asthma, obesity, gastroparesis, fibromyalgia on Subutex , UTI, who presents with lethargy, suprapubic abdominal pain.   Patient was recently hospitalized from 9/24 - 9/29 due to altered mental status and UTI.  Today pt is from home. Per Ems family was concerned that pt would not wake up enough to talk or take her medications. Pt is very sleepy. When I saw pt in ED, pt is lethargic, but easily arousable, oriented x 3, answered all questions appropriately.  She states she had 8 strokes in the past with mild right-sided weakness which has not changed.  Denies chest pain, cough, SOB.  She has nausea, no vomiting or diarrhea.  She states she has mild suprapubic abdominal pain.  Denies dysuria or burning on urination.  No hematuria.  No fever or chills.  Of note, patient is currently taking Keflex  for UTI.     Data reviewed independently and ED Course: pt was found to have WBC 9.0, AKI, UA (hazy appearance, large amount of leukocyte, rare bacteria, WBC 11-20),.  Temperature normal, blood pressure 101/67, heart rate 68, RR 16, oxygen saturation 98% on room air.  CT of head negative for acute intracranial abnormalities.  CT of abdomen/pelvis negative for acute  intraabdominal issue.  Patient is placed in MedSurg bed for observation.    Discharge Diagnoses:  Principal Problem:   UTI (urinary tract infection) Active Problems:   Lethargy   AKI (acute kidney injury)   Diabetes mellitus with complication of gastroparesis   COPD (chronic obstructive pulmonary disease) (HCC)   History of stroke   Fibromyalgia   Bipolar II disorder (HCC)   Depression with anxiety   Essential hypertension   Obesity (BMI 30-39.9)  UTI : failed outpatient keflex . Urine cx shows insignificant growth. Completed abx course   Lethargy: Likely due to UTI & polypharmacy secondary to multiple sedating meds. CT head negative. Pt requested to restart home dose of pregabalin . Much improved    Nausea & vomiting: x 1 on 08/14/24 after taking a walk in the hallway. Zofran  prn. Resolved   AKI: resolved   DM2: poor controlled, HbA1c 10.3. Restart home anti-DM meds/insulin    Hypokalemia: WNL today    Hx of COPD: w/o exacerbation. Bronchodilators prn    Hx of CVA: continue on statin    Fibromyalgia: continue on home dose of subutex    Bipolar II disorder: w/ depression & anxiety. Continue on home dose of lurasidone , invega , buspar, lexapro , cogentin , paliperidone     HTN: continue on home dose of metoprolol, losartan     Obesity: BMI 33.6. Would benefit from weight loss   Discharge Instructions  Discharge Instructions     Diet Carb Modified   Complete by: As directed    Discharge instructions   Complete by: As directed    F/u w/ PCP in 1-2 weeks. F/u w/ psych  within 1 week to see medications can be reduced.   Increase activity slowly   Complete by: As directed       Allergies as of 08/15/2024       Reactions   Celecoxib Other (See Comments), Swelling   Other Reaction: OTHER REACTION = SWELLING   Gabapentin Rash, Swelling   Pt says she gets face and throat swelling.   Levetiracetam Rash   Oxycodone-acetaminophen  Itching, Nausea Only, Swelling   Pt states that  nausea was the most significant effect. Pt states that percocet is tolerable when given ondansetron  (ZOFRAN ).   Quetiapine Other (See Comments), Rash   Increased blood sugars.   Shellfish Allergy Swelling   Scallops, shellfish => swelling   Soap Swelling   [place] soap   Acetaminophen  Nausea And Vomiting   Alprazolam Other (See Comments)   Memory loss   Atorvastatin    Other reaction(s): Muscle Pain   Diazepam Other (See Comments)   Memory loss   Dicyclomine Other (See Comments)   Over-sedation   Ibuprofen Nausea And Vomiting   Other Reaction: TONGUE SWELING/BLEEDING   Ivp Dye [iodinated Contrast Media] Itching   Oxycodone Itching, Swelling   Swelling of tongue   Propoxyphene    With tylenol    Sertraline Other (See Comments)   Other reaction(s): Unknown unknown   Trazodone Other (See Comments)   Over-sedation   Acetaminophen -codeine Itching, Nausea Only   Aspirin Nausea Only, Swelling   Sinus swelling   Codeine Itching, Nausea Only   No associated rash. Itching stops on its own after a few days, but faster when given diphenhydramine.   Tramadol Swelling   Facial swelling   Zolpidem Nausea Only, Other (See Comments)   Causes memory loss        Medication List     STOP taking these medications    cephALEXin  500 MG capsule Commonly known as: KEFLEX        TAKE these medications    albuterol 108 (90 Base) MCG/ACT inhaler Commonly known as: VENTOLIN HFA Inhale 2 puffs into the lungs every 4 (four) hours as needed.   ammonium lactate 12 % cream Commonly known as: AMLACTIN Apply 1 Application topically as needed.   benztropine  1 MG tablet Commonly known as: COGENTIN  Take 0.5 tablets (0.5 mg total) by mouth 2 (two) times daily as needed for tremors.   buprenorphine  2 MG Subl SL tablet Commonly known as: SUBUTEX  Place 2 mg under the tongue in the morning, at noon, and at bedtime.   busPIRone 15 MG tablet Commonly known as: BUSPAR Take 15 mg by mouth  3 (three) times daily.   escitalopram  20 MG tablet Commonly known as: LEXAPRO  Take 20 mg by mouth daily.   fexofenadine 180 MG tablet Commonly known as: ALLEGRA Take 180 mg by mouth daily.   fluticasone 50 MCG/ACT nasal spray Commonly known as: FLONASE Place 2 sprays into the nose daily.   fluticasone-salmeterol 250-50 MCG/ACT Aepb Commonly known as: ADVAIR Inhale 1 puff into the lungs 2 (two) times daily.   Insulin  Aspart FlexPen 100 UNIT/ML Commonly known as: NOVOLOG  Inject 100 Units into the muscle See admin instructions.   Invega  Sustenna 234 MG/1.5ML injection Generic drug: paliperidone  Inject 234 mg into the muscle once.   lactulose 10 GM/15ML solution Commonly known as: CHRONULAC Take 45 mLs (30 g total) by mouth daily as needed for moderate constipation or severe constipation.   lidocaine  5 % Commonly known as: LIDODERM  Place 1 patch onto the skin daily.  Linzess 290 MCG Caps capsule Generic drug: linaclotide Take 290 mcg by mouth daily.   losartan  100 MG tablet Commonly known as: COZAAR  Take 100 mg by mouth daily.   Lurasidone  HCl 120 MG Tabs Take 1 tablet by mouth daily.   metFORMIN 500 MG 24 hr tablet Commonly known as: GLUCOPHAGE-XR Take 1,000 mg by mouth 2 (two) times daily.   metoprolol tartrate 50 MG tablet Commonly known as: LOPRESSOR Take 50 mg by mouth 2 (two) times daily.   ondansetron  4 MG disintegrating tablet Commonly known as: ZOFRAN -ODT Take 1 tablet (4 mg total) by mouth every 6 (six) hours as needed for nausea or vomiting.   paliperidone  3 MG 24 hr tablet Commonly known as: INVEGA  Take 2 tablets (6 mg total) by mouth at bedtime.   pantoprazole  40 MG tablet Commonly known as: PROTONIX  Take 40 mg by mouth daily.   polyethylene glycol 17 g packet Commonly known as: MIRALAX / GLYCOLAX Take 17 g by mouth daily as needed.   pregabalin  150 MG capsule Commonly known as: LYRICA  Take 1 capsule (150 mg total) by mouth 2 (two)  times daily.   rosuvastatin  5 MG tablet Commonly known as: CRESTOR  Take 5 mg by mouth daily.   trospium 20 MG tablet Commonly known as: SANCTURA Take 20 mg by mouth 2 (two) times daily.        Follow-up Information     Bynum, Elsie Bohr IV, MD Follow up.   Specialty: Family Medicine Why: Hospital follow up Contact information: [address] [place] [address] [phone]                Allergies  Allergen Reactions   Celecoxib Other (See Comments) and Swelling    Other Reaction: OTHER REACTION = SWELLING   Gabapentin Rash and Swelling    Pt says she gets face and throat swelling.   Levetiracetam Rash   Oxycodone-Acetaminophen  Itching, Nausea Only and Swelling    Pt states that nausea was the most significant effect. Pt states that percocet is tolerable when given ondansetron  (ZOFRAN ).   Quetiapine Other (See Comments) and Rash    Increased blood sugars.   Shellfish Allergy Swelling    Scallops, shellfish => swelling   Soap Swelling    [place] soap   Acetaminophen  Nausea And Vomiting   Alprazolam Other (See Comments)    Memory loss   Atorvastatin     Other reaction(s): Muscle Pain   Diazepam Other (See Comments)    Memory loss   Dicyclomine Other (See Comments)    Over-sedation   Ibuprofen Nausea And Vomiting    Other Reaction: TONGUE SWELING/BLEEDING   Ivp Dye [Iodinated Contrast Media] Itching   Oxycodone Itching and Swelling    Swelling of tongue   Propoxyphene     With tylenol    Sertraline Other (See Comments)    Other reaction(s): Unknown unknown   Trazodone Other (See Comments)    Over-sedation   Acetaminophen -Codeine Itching and Nausea Only   Aspirin Nausea Only and Swelling    Sinus swelling    Codeine Itching and Nausea Only    No associated rash. Itching stops on its own after a few days, but faster when given diphenhydramine.   Tramadol Swelling    Facial swelling   Zolpidem Nausea Only and Other (See Comments)    Causes  memory loss    Consultations:    Procedures/Studies: CT Head Wo Contrast Result Date: 08/10/2024 EXAM: CT HEAD WITHOUT CONTRAST 08/10/2024 05:39:52 PM  TECHNIQUE: CT of the head was performed without the administration of intravenous contrast. Automated exposure control, iterative reconstruction, and/or weight based adjustment of the mA/kV was utilized to reduce the radiation dose to as low as reasonably achievable. COMPARISON: CT head 08/02/2024. CLINICAL HISTORY: ams. Table formatting from the original note was not included.; Triage note:; Pt BIB EMS from home with concerns from family that pt was to sleepy. Per EMS pt was seen in the the ED and dx with a UTI. Per Ems family was concerned that pt would not wake up enough talk or take her medications. Pt is snoring during triage. Pt ; asked by this RN if she knew why she was her she shook her head no. Pt was asked if she knew why family was concerned. Pt again shook her head no. FINDINGS: BRAIN AND VENTRICLES: No acute hemorrhage. No evidence of acute infarct. No hydrocephalus. No extra-axial collection. No mass effect or midline shift. ORBITS: No acute abnormality. SINUSES: Similar minimal mucosal thickening in the paranasal sinuses. SOFT TISSUES AND SKULL: No acute soft tissue abnormality. No skull fracture. IMPRESSION: 1. No acute intracranial abnormality. Electronically signed by: Donnice Mania MD 08/10/2024 06:01 PM EDT RP Workstation: [id number]   CT ABDOMEN PELVIS WO CONTRAST Result Date: 08/10/2024 CLINICAL DATA:  Abdomen pain body aches EXAM: CT ABDOMEN AND PELVIS WITHOUT CONTRAST TECHNIQUE: Multidetector CT imaging of the abdomen and pelvis was performed following the standard protocol without IV contrast. RADIATION DOSE REDUCTION: This exam was performed according to the departmental dose-optimization program which includes automated exposure control, adjustment of the mA and/or kV according to patient size and/or use of iterative  reconstruction technique. COMPARISON:  CT 08/02/2024, 07/01/2023 FINDINGS: Lower chest: No acute abnormality. Hepatobiliary: No focal liver abnormality is seen. Status post cholecystectomy. Prominence of the common bile duct likely due to postsurgical change. This finding is unchanged. Pancreas: Unremarkable. No pancreatic ductal dilatation or surrounding inflammatory changes. Spleen: Normal in size without focal abnormality. Adrenals/Urinary Tract: Adrenal glands are unremarkable. Kidneys are normal, without renal calculi, focal lesion, or hydronephrosis. Bladder is unremarkable. Stomach/Bowel: Stomach within normal limits. No dilated small bowel. No acute bowel wall thickening. Negative appendix. Large stool burden Vascular/Lymphatic: Aortic atherosclerosis. No enlarged abdominal or pelvic lymph nodes. Reproductive: Prostate is unremarkable. Other: No abdominal wall hernia or abnormality. No abdominopelvic ascites. Musculoskeletal: No acute or significant osseous findings. IMPRESSION: 1. No CT evidence for acute intra-abdominal or pelvic abnormality. 2. Large stool burden. 3. Aortic atherosclerosis. Aortic Atherosclerosis (ICD10-I70.0). Electronically Signed   By: Luke Bun M.D.   On: 08/10/2024 00:39   DG Abdomen 1 View Result Date: 08/09/2024 EXAM: 1 VIEW XRAY OF THE ABDOMEN 08/09/2024 09:56:36 PM COMPARISON: CT abdomen and pelvis from 08/02/2024. CLINICAL HISTORY: Constipation. Pt reports constipation x8 days and generalized body aches. Pt states she was just admitted to the hospital for AMS and UTI, pt was sent home with at home rehab. FINDINGS: BOWEL: Moderate to large colonic stool burden. SOFT TISSUES: Cholecystectomy. No opaque urinary calculi. BONES: No acute osseous abnormality. IMPRESSION: 1. Moderate to large colonic stool burden. Electronically signed by: Norman Gatlin MD 08/09/2024 09:59 PM EDT RP Workstation: [id number]   CT ABDOMEN PELVIS WO CONTRAST Result Date: 08/02/2024 CLINICAL  DATA:  Left lower quadrant pain. EXAM: CT ABDOMEN AND PELVIS WITHOUT CONTRAST TECHNIQUE: Multidetector CT imaging of the abdomen and pelvis was performed following the standard protocol without IV contrast. RADIATION DOSE REDUCTION: This exam was performed according to the departmental dose-optimization program which includes automated  exposure control, adjustment of the mA and/or kV according to patient size and/or use of iterative reconstruction technique. COMPARISON:  July 01, 2023 FINDINGS: Lower chest: No acute abnormality. Hepatobiliary: No focal liver abnormality is seen. Status post cholecystectomy. No biliary dilatation. Pancreas: Unremarkable. No pancreatic ductal dilatation or surrounding inflammatory changes. Spleen: Normal in size without focal abnormality. Adrenals/Urinary Tract: Adrenal glands are unremarkable. Kidneys are normal, without renal calculi, focal lesion, or hydronephrosis. Bladder is unremarkable. Stomach/Bowel: Stomach is within normal limits. Appendix appears normal. No evidence of bowel wall thickening, distention, or inflammatory changes. Vascular/Lymphatic: Aortic atherosclerosis. No enlarged abdominal or pelvic lymph nodes. Reproductive: Status post hysterectomy. No adnexal masses. Other: No abdominal wall hernia or abnormality. No abdominopelvic ascites. Musculoskeletal: No acute or significant osseous findings. IMPRESSION: 1. No acute or active process within the abdomen or pelvis. 2. Evidence of prior cholecystectomy and hysterectomy. 3. Aortic atherosclerosis. Electronically Signed   By: Suzen Dials M.D.   On: 08/02/2024 20:01   CT Head Wo Contrast Result Date: 08/02/2024 CLINICAL DATA:  Mental status change, unknown cause. Generalized weakness. EXAM: CT HEAD WITHOUT CONTRAST TECHNIQUE: Contiguous axial images were obtained from the base of the skull through the vertex without intravenous contrast. RADIATION DOSE REDUCTION: This exam was performed according to the  departmental dose-optimization program which includes automated exposure control, adjustment of the mA and/or kV according to patient size and/or use of iterative reconstruction technique. COMPARISON:  Head CT 03/10/2024 FINDINGS: Brain: There is no evidence of an acute infarct, intracranial hemorrhage, mass, midline shift, or extra-axial fluid collection. Cerebral volume is normal. The ventricles are normal in size. Vascular: No hyperdense vessel. Skull: No fracture or suspicious lesion. Sinuses/Orbits: Minimal mucosal thickening in the included paranasal sinuses. Clear mastoid air cells. Unremarkable orbits. Other: None. IMPRESSION: Unremarkable CT appearance of the brain. Electronically Signed   By: Dasie Hamburg M.D.   On: 08/02/2024 19:50   (Echo, Carotid, EGD, Colonoscopy, ERCP)    Subjective: Pt denies any nausea, vomiting or abd pain.    Discharge Exam: Vitals:   08/14/24 2043 08/15/24 0832  BP: (!) 113/48 (!) 154/75  Pulse: 66 65  Resp: 19 18  Temp: 97.7 F (36.5 C) 99.1 F (37.3 C)  SpO2: 97% 100%   Vitals:   08/14/24 1625 08/14/24 1627 08/14/24 2043 08/15/24 0832  BP: (!) 94/50 (!) 95/54 (!) 113/48 (!) 154/75  Pulse: 60 60 66 65  Resp: 18  19 18   Temp: 97.8 F (36.6 C)  97.7 F (36.5 C) 99.1 F (37.3 C)  TempSrc:   Oral Oral  SpO2: 98%  97% 100%  Weight:      Height:        General: Pt is alert, awake, not in acute distress Cardiovascular: S1/S2 +, no rubs, no gallops Respiratory: CTA bilaterally, no wheezing, no rhonchi Abdominal: Soft, NT, obese, bowel sounds + Extremities: no cyanosis    The results of significant diagnostics from this hospitalization (including imaging, microbiology, ancillary and laboratory) are listed below for reference.     Microbiology: Recent Results (from the past 240 hours)  Urine Culture     Status: Abnormal   Collection Time: 08/09/24  9:13 PM   Specimen: Urine, Clean Catch  Result Value Ref Range Status   Specimen  Description   Final    URINE, CLEAN CATCH Performed at [facility], [address] [place], [place] [address]    Special Requests   Final    NONE Performed at [facility], [address] [place], [place] [address]    Culture MULTIPLE SPECIES PRESENT, SUGGEST RECOLLECTION (A)  Final   Report Status 08/11/2024 FINAL  Final  Resp panel by RT-PCR (RSV, Flu A&B, Covid) Anterior Nasal Swab     Status: None   Collection Time: 08/09/24  9:14 PM   Specimen: Anterior Nasal Swab  Result Value Ref Range Status   SARS Coronavirus 2 by RT PCR NEGATIVE NEGATIVE Final    Comment: (NOTE) SARS-CoV-2 target nucleic acids are NOT DETECTED.  The SARS-CoV-2 RNA is generally detectable in upper respiratory specimens during the acute phase of infection. The lowest concentration of SARS-CoV-2 viral copies this assay can detect is 138 copies/mL. A negative result does not preclude SARS-Cov-2 infection and should not be used as the sole basis for treatment or other patient management decisions. A negative result may occur with  improper specimen collection/handling, submission of specimen other than nasopharyngeal swab, presence of viral mutation(s) within the areas targeted by this assay, and inadequate number of viral copies(<138 copies/mL). A negative result must be combined with clinical observations, patient history, and epidemiological information. The expected result is Negative.  Fact Sheet for Patients:  BloggerCourse.com  Fact Sheet for Healthcare Providers:  SeriousBroker.it  This test is no t yet approved or cleared by the [place]  FDA and  has been authorized for detection and/or diagnosis of SARS-CoV-2 by FDA under an Emergency Use Authorization (EUA). This EUA will remain  in effect (meaning this test can be used) for the duration of the COVID-19 declaration under Section 564(b)(1) of the Act, 21 U.S.C.section  360bbb-3(b)(1), unless the authorization is terminated  or revoked sooner.       Influenza A by PCR NEGATIVE NEGATIVE Final   Influenza B by PCR NEGATIVE NEGATIVE Final    Comment: (NOTE) The Xpert Xpress SARS-CoV-2/FLU/RSV plus assay is intended as an aid in the diagnosis of influenza from Nasopharyngeal swab specimens and should not be used as a sole basis for treatment. Nasal washings and aspirates are unacceptable for Xpert Xpress SARS-CoV-2/FLU/RSV testing.  Fact Sheet for Patients: BloggerCourse.com  Fact Sheet for Healthcare Providers: SeriousBroker.it  This test is not yet approved or cleared by the [place]  FDA and has been authorized for detection and/or diagnosis of SARS-CoV-2 by FDA under an Emergency Use Authorization (EUA). This EUA will remain in effect (meaning this test can be used) for the duration of the COVID-19 declaration under Section 564(b)(1) of the Act, 21 U.S.C. section 360bbb-3(b)(1), unless the authorization is terminated or revoked.     Resp Syncytial Virus by PCR NEGATIVE NEGATIVE Final    Comment: (NOTE) Fact Sheet for Patients: BloggerCourse.com  Fact Sheet for Healthcare Providers: SeriousBroker.it  This test is not yet approved or cleared by the [place]  FDA and has been authorized for detection and/or diagnosis of SARS-CoV-2 by FDA under an Emergency Use Authorization (EUA). This EUA will remain in effect (meaning this test can be used) for the duration of the COVID-19 declaration under Section 564(b)(1) of the Act, 21 U.S.C. section 360bbb-3(b)(1), unless the authorization is terminated or revoked.  Performed at [facility], [address], [place], [place] [address]   Urine Culture     Status: Abnormal   Collection Time: 08/11/24  2:30 AM   Specimen: Urine, Catheterized  Result Value Ref Range Status   Specimen  Description   Final    URINE, CATHETERIZED Performed at [facility], [address] [place], [place] [address]  Special Requests   Final    NONE Performed at [facility], [address] [place], [place] [address]    Culture (A)  Final    <10,000 COLONIES/mL INSIGNIFICANT GROWTH Performed at [facility], [address], [place], [place] [address]    Report Status 08/12/2024 FINAL  Final     Labs: BNP (last 3 results) Recent Labs    05/20/24 1114  BNP 25.6   Basic Metabolic Panel: Recent Labs  Lab 08/11/24 0453 08/12/24 0507 08/13/24 0331 08/14/24 0542 08/15/24 0502  NA 139 141 140 139 136  K 3.1* 3.4* 3.5 3.8 3.6  CL 104 105 102 102 101  CO2 27 28 28 28 29   GLUCOSE 186* 119* 144* 259* 203*  BUN 11 7 9 14 13   CREATININE 0.88 0.80 9.06 0.98 0.86  CALCIUM  9.1 9.0 9.1 9.3 9.0   Liver Function Tests: Recent Labs  Lab 08/10/24 1730  AST 27  ALT 19  ALKPHOS 67  BILITOT 1.0  PROT 7.8  ALBUMIN 3.9   No results for input(s): LIPASE, AMYLASE in the last 168 hours. No results for input(s): AMMONIA in the last 168 hours. CBC: Recent Labs  Lab 08/09/24 2113 08/10/24 2018 08/11/24 0453 08/12/24 0507 08/13/24 0331 08/14/24 0542 08/15/24 0502  WBC 8.7 9.0 8.4 6.9 7.3 7.9 7.1  NEUTROABS 4.6 3.9  --   --   --   --   --   HGB 11.1* 11.1* 10.0* 9.7* 10.1* 10.7* 10.5*  HCT 34.1* 35.1* 30.4* 30.7* 31.2* 33.0* 32.1*  MCV 93.2 94.4 92.4 92.2 92.0 92.4 91.7  PLT 213 221 191 194 199 215 215   Cardiac Enzymes: No results for input(s): CKTOTAL, CKMB, CKMBINDEX, TROPONINI in the last 168 hours. BNP: Invalid input(s): POCBNP CBG: Recent Labs  Lab 08/14/24 1118 08/14/24 1627 08/14/24 2038 08/14/24 2356 08/15/24 0906  GLUCAP 196* 162* 199* 210* 161*   D-Dimer No results for input(s): DDIMER in the last 72 hours. Hgb A1c No results for input(s): HGBA1C in the last 72 hours. Lipid Profile No results for input(s):  CHOL, HDL, LDLCALC, TRIG, CHOLHDL, LDLDIRECT in the last 72 hours. Thyroid function studies No results for input(s): TSH, T4TOTAL, T3FREE, THYROIDAB in the last 72 hours.  Invalid input(s): FREET3 Anemia work up No results for input(s): VITAMINB12, FOLATE, FERRITIN, TIBC, IRON, RETICCTPCT in the last 72 hours. Urinalysis    Component Value Date/Time   COLORURINE YELLOW (A) 08/10/2024 2236   APPEARANCEUR HAZY (A) 08/10/2024 2236   LABSPEC 1.013 08/10/2024 2236   PHURINE 5.0 08/10/2024 2236   GLUCOSEU NEGATIVE 08/10/2024 2236   HGBUR NEGATIVE 08/10/2024 2236   BILIRUBINUR NEGATIVE 08/10/2024 2236   KETONESUR NEGATIVE 08/10/2024 2236   PROTEINUR NEGATIVE 08/10/2024 2236   NITRITE NEGATIVE 08/10/2024 2236   LEUKOCYTESUR LARGE (A) 08/10/2024 2236   Sepsis Labs Recent Labs  Lab 08/12/24 0507 08/13/24 0331 08/14/24 0542 08/15/24 0502  WBC 6.9 7.3 7.9 7.1   Microbiology Recent Results (from the past 240 hours)  Urine Culture     Status: Abnormal   Collection Time: 08/09/24  9:13 PM   Specimen: Urine, Clean Catch  Result Value Ref Range Status   Specimen Description   Final    URINE, CLEAN CATCH Performed at [facility], [address], [place], [place] [address]    Special Requests   Final    NONE Performed at [facility], [address], [place], [place] [address]    Culture  MULTIPLE SPECIES PRESENT, SUGGEST RECOLLECTION (A)  Final   Report Status 08/11/2024 FINAL  Final  Resp panel by RT-PCR (RSV, Flu A&B, Covid) Anterior Nasal Swab     Status: None   Collection Time: 08/09/24  9:14 PM   Specimen: Anterior Nasal Swab  Result Value Ref Range Status   SARS Coronavirus 2 by RT PCR NEGATIVE NEGATIVE Final    Comment: (NOTE) SARS-CoV-2 target nucleic acids are NOT DETECTED.  The SARS-CoV-2 RNA is generally detectable in upper respiratory specimens during the acute phase of infection. The lowest concentration of  SARS-CoV-2 viral copies this assay can detect is 138 copies/mL. A negative result does not preclude SARS-Cov-2 infection and should not be used as the sole basis for treatment or other patient management decisions. A negative result may occur with  improper specimen collection/handling, submission of specimen other than nasopharyngeal swab, presence of viral mutation(s) within the areas targeted by this assay, and inadequate number of viral copies(<138 copies/mL). A negative result must be combined with clinical observations, patient history, and epidemiological information. The expected result is Negative.  Fact Sheet for Patients:  BloggerCourse.com  Fact Sheet for Healthcare Providers:  SeriousBroker.it  This test is no t yet approved or cleared by the [place]  FDA and  has been authorized for detection and/or diagnosis of SARS-CoV-2 by FDA under an Emergency Use Authorization (EUA). This EUA will remain  in effect (meaning this test can be used) for the duration of the COVID-19 declaration under Section 564(b)(1) of the Act, 21 U.S.C.section 360bbb-3(b)(1), unless the authorization is terminated  or revoked sooner.       Influenza A by PCR NEGATIVE NEGATIVE Final   Influenza B by PCR NEGATIVE NEGATIVE Final    Comment: (NOTE) The Xpert Xpress SARS-CoV-2/FLU/RSV plus assay is intended as an aid in the diagnosis of influenza from Nasopharyngeal swab specimens and should not be used as a sole basis for treatment. Nasal washings and aspirates are unacceptable for Xpert Xpress SARS-CoV-2/FLU/RSV testing.  Fact Sheet for Patients: BloggerCourse.com  Fact Sheet for Healthcare Providers: SeriousBroker.it  This test is not yet approved or cleared by the [place]  FDA and has been authorized for detection and/or diagnosis of SARS-CoV-2 by FDA under an Emergency Use  Authorization (EUA). This EUA will remain in effect (meaning this test can be used) for the duration of the COVID-19 declaration under Section 564(b)(1) of the Act, 21 U.S.C. section 360bbb-3(b)(1), unless the authorization is terminated or revoked.     Resp Syncytial Virus by PCR NEGATIVE NEGATIVE Final    Comment: (NOTE) Fact Sheet for Patients: BloggerCourse.com  Fact Sheet for Healthcare Providers: SeriousBroker.it  This test is not yet approved or cleared by the [place]  FDA and has been authorized for detection and/or diagnosis of SARS-CoV-2 by FDA under an Emergency Use Authorization (EUA). This EUA will remain in effect (meaning this test can be used) for the duration of the COVID-19 declaration under Section 564(b)(1) of the Act, 21 U.S.C. section 360bbb-3(b)(1), unless the authorization is terminated or revoked.  Performed at [facility], [address], [place], [place] [address]   Urine Culture     Status: Abnormal   Collection Time: 08/11/24  2:30 AM   Specimen: Urine, Catheterized  Result Value Ref Range Status   Specimen Description   Final    URINE, CATHETERIZED Performed at [facility], [address], [place], [place] [address]    Special Requests   Final    NONE  Performed at [facility], [address], [place], [place] [address]    Culture (A)  Final    <10,000 COLONIES/mL INSIGNIFICANT GROWTH Performed at [facility], [address] [place], [place] [address]    Report Status 08/12/2024 FINAL  Final     Time coordinating discharge: 39 minutes  SIGNED:   Anthony CHRISTELLA Pouch, MD  [facility] 08/15/2024, 10:38 AM Pager   If 7PM-7AM, please contact night-coverage [email or web address]

## 2024-08-19 ENCOUNTER — Emergency Department

## 2024-08-19 ENCOUNTER — Other Ambulatory Visit: Payer: Self-pay

## 2024-08-19 ENCOUNTER — Observation Stay
Admission: EM | Admit: 2024-08-19 | Discharge: 2024-08-20 | Disposition: A | Discharge status: Home Health | Attending: Student in an Organized Health Care Education/Training Program | Admitting: Student in an Organized Health Care Education/Training Program

## 2024-08-19 DIAGNOSIS — N179 Acute kidney failure, unspecified: Secondary | ICD-10-CM | POA: Diagnosis not present

## 2024-08-19 DIAGNOSIS — J449 Chronic obstructive pulmonary disease, unspecified: Secondary | ICD-10-CM | POA: Diagnosis not present

## 2024-08-19 DIAGNOSIS — G9340 Encephalopathy, unspecified: Secondary | ICD-10-CM | POA: Diagnosis not present

## 2024-08-19 DIAGNOSIS — F17210 Nicotine dependence, cigarettes, uncomplicated: Secondary | ICD-10-CM | POA: Diagnosis not present

## 2024-08-19 DIAGNOSIS — K5900 Constipation, unspecified: Secondary | ICD-10-CM | POA: Diagnosis not present

## 2024-08-19 DIAGNOSIS — Z79899 Other long term (current) drug therapy: Secondary | ICD-10-CM | POA: Insufficient documentation

## 2024-08-19 DIAGNOSIS — E119 Type 2 diabetes mellitus without complications: Secondary | ICD-10-CM | POA: Diagnosis not present

## 2024-08-19 DIAGNOSIS — M797 Fibromyalgia: Secondary | ICD-10-CM | POA: Diagnosis present

## 2024-08-19 DIAGNOSIS — I10 Essential (primary) hypertension: Secondary | ICD-10-CM | POA: Insufficient documentation

## 2024-08-19 DIAGNOSIS — Z8673 Personal history of transient ischemic attack (TIA), and cerebral infarction without residual deficits: Secondary | ICD-10-CM

## 2024-08-19 DIAGNOSIS — R400 Somnolence: Secondary | ICD-10-CM | POA: Diagnosis not present

## 2024-08-19 DIAGNOSIS — E785 Hyperlipidemia, unspecified: Secondary | ICD-10-CM | POA: Diagnosis not present

## 2024-08-19 DIAGNOSIS — F3181 Bipolar II disorder: Secondary | ICD-10-CM | POA: Diagnosis present

## 2024-08-19 DIAGNOSIS — F418 Other specified anxiety disorders: Secondary | ICD-10-CM | POA: Diagnosis present

## 2024-08-19 DIAGNOSIS — R4182 Altered mental status, unspecified: Secondary | ICD-10-CM | POA: Diagnosis present

## 2024-08-19 DIAGNOSIS — E1165 Type 2 diabetes mellitus with hyperglycemia: Secondary | ICD-10-CM

## 2024-08-19 LAB — URINALYSIS, ROUTINE W REFLEX MICROSCOPIC
Bilirubin Urine: NEGATIVE
Glucose, UA: NEGATIVE mg/dL
Hgb urine dipstick: NEGATIVE
Ketones, ur: NEGATIVE mg/dL
Nitrite: NEGATIVE
Protein, ur: NEGATIVE mg/dL
Specific Gravity, Urine: 1.013 (ref 1.005–1.030)
pH: 5 (ref 5.0–8.0)

## 2024-08-19 LAB — COMPREHENSIVE METABOLIC PANEL WITH GFR
ALT: 24 U/L (ref 0–44)
AST: 50 U/L — ABNORMAL HIGH (ref 15–41)
Albumin: 4.3 g/dL (ref 3.5–5.0)
Alkaline Phosphatase: 59 U/L (ref 38–126)
Anion gap: 15 (ref 5–15)
BUN: 16 mg/dL (ref 6–20)
CO2: 25 mmol/L (ref 22–32)
Calcium: 9.8 mg/dL (ref 8.9–10.3)
Chloride: 100 mmol/L (ref 98–111)
Creatinine, Ser: 1.66 mg/dL — ABNORMAL HIGH (ref 0.44–1.00)
GFR, Estimated: 36 mL/min — ABNORMAL LOW (ref >60)
Glucose, Bld: 139 mg/dL — ABNORMAL HIGH (ref 70–99)
Potassium: 3.6 mmol/L (ref 3.5–5.1)
Sodium: 140 mmol/L (ref 135–145)
Total Bilirubin: 0.7 mg/dL (ref 0.0–1.2)
Total Protein: 7.9 g/dL (ref 6.5–8.1)

## 2024-08-19 LAB — CBC
HCT: 35.2 % — ABNORMAL LOW (ref 36.0–46.0)
Hemoglobin: 11.4 g/dL — ABNORMAL LOW (ref 12.0–15.0)
MCH: 29.8 pg (ref 26.0–34.0)
MCHC: 32.4 g/dL (ref 30.0–36.0)
MCV: 92.1 fL (ref 80.0–100.0)
Platelets: 216 K/uL (ref 150–400)
RBC: 3.82 MIL/uL — ABNORMAL LOW (ref 3.87–5.11)
RDW: 13.1 % (ref 11.5–15.5)
WBC: 10.6 K/uL — ABNORMAL HIGH (ref 4.0–10.5)
nRBC: 0 % (ref 0.0–0.2)

## 2024-08-19 LAB — CBG MONITORING, ED
Glucose-Capillary: 128 mg/dL — ABNORMAL HIGH (ref 70–99)
Glucose-Capillary: 123 mg/dL — ABNORMAL HIGH (ref 70–99)

## 2024-08-19 LAB — GLUCOSE, CAPILLARY: Glucose-Capillary: 138 mg/dL — ABNORMAL HIGH (ref 70–99)

## 2024-08-19 LAB — AMMONIA: Ammonia: 42 umol/L — ABNORMAL HIGH (ref 9–35)

## 2024-08-19 MED ORDER — BUPRENORPHINE HCL 2 MG SL SUBL
2.0000 mg | SUBLINGUAL_TABLET | Freq: Every day | SUBLINGUAL | Status: DC
Start: 2024-08-20 — End: 2024-08-19

## 2024-08-19 MED ORDER — ALBUTEROL SULFATE (2.5 MG/3ML) 0.083% IN NEBU
2.5000 mg | INHALATION_SOLUTION | RESPIRATORY_TRACT | Status: DC | PRN
Start: 2024-08-19 — End: 2024-08-20

## 2024-08-19 MED ORDER — BUPRENORPHINE HCL 2 MG SL SUBL
2.0000 mg | SUBLINGUAL_TABLET | Freq: Two times a day (BID) | SUBLINGUAL | Status: DC
  Administered 2024-08-19 – 2024-08-20 (×2): 2 mg via SUBLINGUAL
  Filled 2024-08-19 (×2): qty 1

## 2024-08-19 MED ORDER — INSULIN ASPART 100 UNIT/ML IJ SOLN
0.0000 [IU] | Freq: Three times a day (TID) | INTRAMUSCULAR | Status: DC
  Administered 2024-08-20: 3 [IU] via SUBCUTANEOUS
  Filled 2024-08-19: qty 1

## 2024-08-19 MED ORDER — SODIUM CHLORIDE 0.9 % IV BOLUS
1000.0000 mL | Freq: Once | INTRAVENOUS | Status: AC
  Administered 2024-08-19: 1000 mL via INTRAVENOUS

## 2024-08-19 MED ORDER — ONDANSETRON HCL 4 MG PO TABS: 4.0000 mg | ORAL_TABLET | Freq: Four times a day (QID) | ORAL | Status: DC | PRN

## 2024-08-19 MED ORDER — ENOXAPARIN SODIUM 60 MG/0.6ML IJ SOSY
50.0000 mg | PREFILLED_SYRINGE | INTRAMUSCULAR | Status: DC
  Administered 2024-08-19: 50 mg via SUBCUTANEOUS
  Filled 2024-08-19: qty 0.6

## 2024-08-19 MED ORDER — POLYETHYLENE GLYCOL 3350 17 G PO PACK: 17.0000 g | PACK | Freq: Every day | ORAL | Status: DC | PRN

## 2024-08-19 MED ORDER — ROSUVASTATIN CALCIUM 5 MG PO TABS
5.0000 mg | ORAL_TABLET | Freq: Every day | ORAL | Status: DC
  Administered 2024-08-20: 5 mg via ORAL
  Filled 2024-08-19: qty 1

## 2024-08-19 MED ORDER — ONDANSETRON HCL 4 MG/2ML IJ SOLN
4.0000 mg | Freq: Four times a day (QID) | INTRAMUSCULAR | Status: DC | PRN
  Administered 2024-08-20: 4 mg via INTRAVENOUS
  Filled 2024-08-19: qty 2

## 2024-08-19 MED ORDER — INSULIN ASPART 100 UNIT/ML IJ SOLN: 0.0000 [IU] | Freq: Every day | INTRAMUSCULAR | Status: DC

## 2024-08-19 MED ORDER — BUPRENORPHINE HCL 2 MG SL SUBL: 2.0000 mg | SUBLINGUAL_TABLET | Freq: Two times a day (BID) | SUBLINGUAL | Status: DC

## 2024-08-19 MED ORDER — INSULIN ASPART 100 UNIT/ML IJ SOLN: 4.0000 [IU] | Freq: Three times a day (TID) | INTRAMUSCULAR | Status: DC

## 2024-08-19 MED ORDER — METOPROLOL TARTRATE 50 MG PO TABS
50.0000 mg | ORAL_TABLET | Freq: Two times a day (BID) | ORAL | Status: DC
  Administered 2024-08-20: 50 mg via ORAL
  Filled 2024-08-19: qty 1

## 2024-08-19 MED ORDER — PANTOPRAZOLE SODIUM 40 MG PO TBEC
40.0000 mg | DELAYED_RELEASE_TABLET | Freq: Every day | ORAL | Status: DC
  Administered 2024-08-20: 40 mg via ORAL
  Filled 2024-08-19: qty 1

## 2024-08-19 MED ORDER — SODIUM CHLORIDE 0.9 % IV SOLN: INTRAVENOUS | Status: DC

## 2024-08-19 NOTE — ED Notes (Signed)
 Pt transported to MRI

## 2024-08-19 NOTE — ED Notes (Signed)
 Pt returned from MRI

## 2024-08-19 NOTE — ED Triage Notes (Signed)
 Pt arrives via EMS from home. Pt was reportedly recently admitted and discharged for a UTI. Patient's spouse reported to EMS that she has had ongoing confusion and lethargy since being released. Pt arrives alert to voice and has some confusion noted.

## 2024-08-19 NOTE — ED Provider Notes (Signed)
 [facility] Provider Note    Event Date/Time   First MD Initiated Contact with Patient 08/19/24 [phone]     (approximate)   History   Altered Mental Status   HPI  Alicia Mayo is a 58 year old female with history of T2DM, chronic pain, asthma presenting to the emergency department for evaluation of altered mental status.  On exam patient somnolent, arouses to light touch but quickly falls back asleep.  Able to tell me her name, unable to provide further history.  On review of her chart, she was recently admitted and discharged on 10/7.  Presented with altered mental status with concerns for UTI.  Urine culture without significant growth.  Lethargy thought to be possibly related to UTI and polypharmacy.  Also reviewed discharge summary from 08/07/2024.  Presented with metabolic encephalopathy at that time.  Had psych medications adjusted.  Culture from 9/24 did grow staph epidermis.     Physical Exam   Triage Vital Signs: ED Triage Vitals  Encounter Vitals Group     BP 08/19/24 0949 (!) 100/53     Girls Systolic BP Percentile --      Girls Diastolic BP Percentile --      Boys Systolic BP Percentile --      Boys Diastolic BP Percentile --      Pulse Rate 08/19/24 0949 80     Resp 08/19/24 0949 15     Temp 08/19/24 0949 97.8 F (36.6 C)     Temp Source 08/19/24 0949 Axillary     SpO2 08/19/24 0945 97 %     Weight --      Height --      Head Circumference --      Peak Flow --      Pain Score --      Pain Loc --      Pain Education --      Exclude from Growth Chart --     Most recent vital signs: Vitals:   08/19/24 1130 08/19/24 1353  BP: (!) 98/57 107/77  Pulse: 71 64  Resp: 10 14  Temp:  98 F (36.7 C)  SpO2: 97% 100%     General: Somnolent but arousable CV:  Good peripheral perfusion Resp:  Unlabored respirations Abd:  Nondistended.  Neuro:  Somnolent, 5-5 strength of bilateral upper extremities, does not hold lower extremities  antigravity but limited exam due to patient frequently falling asleep   ED Results / Procedures / Treatments   Labs (all labs ordered are listed, but only abnormal results are displayed) Labs Reviewed  COMPREHENSIVE METABOLIC PANEL WITH GFR - Abnormal; Notable for the following components:      Result Value   Glucose, Bld 139 (*)    Creatinine, Ser 1.66 (*)    AST 50 (*)    GFR, Estimated 36 (*)    All other components within normal limits  CBC - Abnormal; Notable for the following components:   WBC 10.6 (*)    RBC 3.82 (*)    Hemoglobin 11.4 (*)    HCT 35.2 (*)    All other components within normal limits  CBG MONITORING, ED - Abnormal; Notable for the following components:   Glucose-Capillary 128 (*)    All other components within normal limits  AMMONIA  URINALYSIS, ROUTINE W REFLEX MICROSCOPIC     EKG EKG independently reviewed and interpreted by myself demonstrates:  EKG demonstrate sinus rhythm at a rate of 79, PR 169, cures 92,  QTc 437, no acute ST changes  RADIOLOGY Imaging independently reviewed and interpreted by myself demonstrates:  CT head without acute bleed Chest x-Fallou Hulbert with suspected atelectasis  Formal Radiology Read:  CT Head Wo Contrast Result Date: 08/19/2024 EXAM: CT HEAD WITHOUT CONTRAST 08/19/2024 10:55:39 AM TECHNIQUE: CT of the head was performed without the administration of intravenous contrast. Automated exposure control, iterative reconstruction, and/or weight based adjustment of the mA/kV was utilized to reduce the radiation dose to as low as reasonably achievable. COMPARISON: CT head without contrast 08/10/2024. CLINICAL HISTORY: Mental status change, unknown cause. Pt was reportedly recently admitted and discharged for a UTI. Patient's spouse reported to EMS that she has had ongoing confusion and lethargy since being released. Pt arrives alert to voice and has some confusion noted. FINDINGS: BRAIN AND VENTRICLES: No acute hemorrhage. No evidence  of acute infarct. No hydrocephalus. No extra-axial collection. No mass effect or midline shift. ORBITS: No acute abnormality. SINUSES: No acute abnormality. SOFT TISSUES AND SKULL: No acute soft tissue abnormality. No skull fracture. IMPRESSION: 1. No acute intracranial abnormality. Electronically signed by: Lonni Necessary MD 08/19/2024 10:59 AM EDT RP Workstation: [id number]   DG Chest Portable 1 View Result Date: 08/19/2024 CLINICAL DATA:  Weakness EXAM: PORTABLE CHEST 1 VIEW COMPARISON:  Chest radiograph dated 10/13/2021 FINDINGS: Low lung volumes with bronchovascular crowding. Bibasilar patchy opacities. No pleural effusion or pneumothorax. The heart size and mediastinal contours are within normal limits. No acute osseous abnormality. IMPRESSION: Low lung volumes with bronchovascular crowding. Bibasilar patchy opacities, likely atelectasis. Aspiration or pneumonia can be considered in the appropriate clinical setting. Electronically Signed   By: Limin  Xu M.D.   On: 08/19/2024 10:32    PROCEDURES:  Critical Care performed: No  Procedures   MEDICATIONS ORDERED IN ED: Medications  sodium chloride  0.9 % bolus 1,000 mL (1,000 mLs Intravenous New Bag/Given 08/19/24 1353)     IMPRESSION / MDM / ASSESSMENT AND PLAN / ED COURSE  I reviewed the triage vital signs and the nursing notes.  Differential diagnosis includes, but is not limited to, polypharmacy, UTI, pneumonia, anemia, electrolyte abnormality, acute intracranial process, no focal deficits are clear last known well, no indication for code stroke  Patient's presentation is most consistent with acute presentation with potential threat to life or bodily function.  58 year old female presenting to the emergency department for evaluation of altered mental status.  Somnolent on presentation.  Stable vitals.  Will obtain labs, CT, x-Maximilian Tallo to further evaluate.    Clinical Course as of 08/19/24 1407  Sat Aug 19, 2024  1333 Labs with  mild leukocytosis, stable anemia, AKI noted with creatinine of 1.66 up from 0.9 a few days ago.  CT head without acute abnormality.  X-Seymone Forlenza without obvious focal consolidation.  Patient serially reassessed and remains quite somnolent unable to provide significant additional history.  She remains without appreciable focal deficits, but given her degree of somnolence I have ordered an MRI to further evaluate.  She was ordered for IV fluids in the setting of her AKI.  With her degree of altered mentation, do think she is appropriate for readmission.  Will reach out to hospitalist team. [NR]  1407 Case discussed with hospitalist team.  They will evaluate for anticipate admission. [NR]    Clinical Course User Index [NR] Levander Slate, MD     FINAL CLINICAL IMPRESSION(S) / ED DIAGNOSES   Final diagnoses:  Acute encephalopathy     Rx / DC Orders   ED Discharge Orders  None        Note:  This document was prepared using Dragon voice recognition software and may include unintentional dictation errors.   Levander Slate, MD 08/19/24 [phone]

## 2024-08-19 NOTE — Progress Notes (Signed)
 PHARMACIST - PHYSICIAN COMMUNICATION  CONCERNING:  Enoxaparin  (Lovenox ) for DVT Prophylaxis    RECOMMENDATION: Patient was prescribed enoxaprin 40mg  q24 hours for VTE prophylaxis.   Filed Weights   08/19/24 1040  Weight: 97 kg (213 lb 13.5 oz)    Body mass index is 33.49 kg/m.  Estimated Creatinine Clearance: 45.3 mL/min (A) (by C-G formula based on SCr of 1.66 mg/dL (H)).   Based on [facility] policy patient is candidate for enoxaparin  0.5mg /kg TBW SQ every 24 hours based on BMI being >30.  DESCRIPTION: Pharmacy has adjusted enoxaparin  dose per [facility] policy.  Patient is now receiving enoxaparin  50 mg every 24 hours    Lum VEAR Mania, PharmD Clinical Pharmacist  08/19/2024 3:49 PM

## 2024-08-19 NOTE — ED Notes (Signed)
Fall bundle in place

## 2024-08-19 NOTE — H&P (Signed)
 History and Physical    Alicia Mayo FMW:[id number] DOB: [birth year] DOA: 08/19/2024  DOS: the patient was seen and examined on 08/19/2024  PCP: Dante Elsie Celestia MADISON, MD   Patient coming from: Home  I have personally briefly reviewed patient's old medical records in [facility]  Chief Complaint: Acute change in mental status  HPI: Alicia Mayo is a pleasant 58 y.o. female with medical history significant for bipolar, anxiety/depression, PTSD, multiple strokes with right-sided weakness, HTN, DM, COPD/asthma, obesity, gastroparesis, fibromyalgia on Subutex , recurrent UTIs and multiple admissions for altered mental status who was brought in for acute change in mental status.  She was recently hospitalized between 08/02/2024 and 08/07/2024, 10/2 - 08/15/2024.  She was brought in from home today as patient's spouse called EMS for ongoing confusion and lethargy since being released from the hospital.  When EMS arrived patient was alert to voice and has some confusion noted. Patient was sleepy and not able to provide any single information.  History was taken from the emergency room chart and reviewed. There was no documentation of fever, chills, chest pain, cough, shortness of breath.  ED Course: Upon arrival to the ED, patient is found to be lethargic, not able to provide history.  Blood pressure was soft, afebrile, saturating 100% on room air, without leukocytosis, WBC 10.6, creatinine 1.66, AST 50, EKG showed sinus rhythm no ST elevations, chest x-ray showed no acute pathology read as a questionable bibasilar opacity, CT head showed no acute bleed.  MRI of the brain was negative for acute pathology. Patient was given 1 L normal saline.  Hospital service was consulted for evaluation for mild leukocytosis, anemia, AKI with creatinine of 1.66.   Review of Systems:  ROS  All other systems negative except as noted in the HPI.  Past Medical History:  Diagnosis Date   Asthma    COPD  (chronic obstructive pulmonary disease) (HCC)    Diabetes mellitus without complication (HCC)    Fibromyalgia    Hypertension    Stroke [facility]) 2023   2023, 2008 and others. 8 strokes. R hemiparesis    Past Surgical History:  Procedure Laterality Date   APPENDECTOMY     CHOLECYSTECTOMY       reports that she has been smoking cigarettes. She has never used smokeless tobacco. She reports that she does not currently use alcohol. She reports that she does not use drugs.  Allergies  Allergen Reactions   Celecoxib Other (See Comments) and Swelling    Other Reaction: OTHER REACTION = SWELLING   Gabapentin Rash and Swelling    Pt says she gets face and throat swelling.   Levetiracetam Rash   Oxycodone-Acetaminophen  Itching, Nausea Only and Swelling    Pt states that nausea was the most significant effect. Pt states that percocet is tolerable when given ondansetron  (ZOFRAN ).   Quetiapine Other (See Comments) and Rash    Increased blood sugars.   Shellfish Allergy Swelling    Scallops, shellfish => swelling   Soap Swelling    [place] soap   Acetaminophen  Nausea And Vomiting   Alprazolam Other (See Comments)    Memory loss   Atorvastatin     Other reaction(s): Muscle Pain   Diazepam Other (See Comments)    Memory loss   Dicyclomine Other (See Comments)    Over-sedation   Ibuprofen Nausea And Vomiting    Other Reaction: TONGUE SWELING/BLEEDING   Ivp Dye [Iodinated Contrast Media] Itching   Oxycodone Itching and Swelling  Swelling of tongue   Propoxyphene     With tylenol    Sertraline Other (See Comments)    Other reaction(s): Unknown unknown   Trazodone Other (See Comments)    Over-sedation   Acetaminophen -Codeine Itching and Nausea Only   Aspirin Nausea Only and Swelling    Sinus swelling    Codeine Itching and Nausea Only    No associated rash. Itching stops on its own after a few days, but faster when given diphenhydramine.   Tramadol Swelling    Facial  swelling   Zolpidem Nausea Only and Other (See Comments)    Causes memory loss    History reviewed. No pertinent family history.  Prior to Admission medications   Medication Sig Start Date End Date Taking? Authorizing Provider  albuterol (VENTOLIN HFA) 108 (90 Base) MCG/ACT inhaler Inhale 2 puffs into the lungs every 4 (four) hours as needed.    [provider]  ammonium lactate (AMLACTIN) 12 % cream Apply 1 Application topically as needed. 07/03/24   [provider]  benztropine  (COGENTIN ) 1 MG tablet Take 0.5 tablets (0.5 mg total) by mouth 2 (two) times daily as needed for tremors. 08/07/24   Dino Antu, MD  buprenorphine  (SUBUTEX ) 2 MG SUBL SL tablet Place 2 mg under the tongue in the morning, at noon, and at bedtime.    [provider]  busPIRone (BUSPAR) 15 MG tablet Take 15 mg by mouth 3 (three) times daily.    [provider]  escitalopram  (LEXAPRO ) 20 MG tablet Take 20 mg by mouth daily.    [provider]  fexofenadine (ALLEGRA) 180 MG tablet Take 180 mg by mouth daily. 04/11/24   [provider]  fluticasone (FLONASE) 50 MCG/ACT nasal spray Place 2 sprays into the nose daily. 06/14/24   [provider]  fluticasone-salmeterol (ADVAIR) 250-50 MCG/ACT AEPB Inhale 1 puff into the lungs 2 (two) times daily.    [provider]  Insulin  Aspart FlexPen (NOVOLOG ) 100 UNIT/ML Inject 100 Units into the muscle See admin instructions. 07/28/24   [provider]  INVEGA  SUSTENNA 234 MG/1.5ML injection Inject 234 mg into the muscle once. 07/11/24   [provider]  lactulose (CHRONULAC) 10 GM/15ML solution Take 45 mLs (30 g total) by mouth daily as needed for moderate constipation or severe constipation. 08/10/24   Ward, Josette SAILOR, DO  lidocaine  (LIDODERM ) 5 % Place 1 patch onto the skin daily.    [provider]  LINZESS 290 MCG CAPS capsule Take 290 mcg by mouth daily.    [provider]   losartan  (COZAAR ) 100 MG tablet Take 100 mg by mouth daily.    [provider]  Lurasidone  HCl 120 MG TABS Take 1 tablet by mouth daily. 07/27/24   [provider]  metFORMIN (GLUCOPHAGE-XR) 500 MG 24 hr tablet Take 1,000 mg by mouth 2 (two) times daily.    [provider]  metoprolol tartrate (LOPRESSOR) 50 MG tablet Take 50 mg by mouth 2 (two) times daily.    [provider]  ondansetron  (ZOFRAN -ODT) 4 MG disintegrating tablet Take 1 tablet (4 mg total) by mouth every 6 (six) hours as needed for nausea or vomiting. 08/10/24   Ward, Josette SAILOR, DO  paliperidone  (INVEGA ) 3 MG 24 hr tablet Take 2 tablets (6 mg total) by mouth at bedtime. 08/07/24   Rashid, Farhan, MD  pantoprazole  (PROTONIX ) 40 MG tablet Take 40 mg by mouth daily.    [provider]  polyethylene glycol (MIRALAX /  GLYCOLAX) 17 g packet Take 17 g by mouth daily as needed. 01/14/24   [provider]  pregabalin  (LYRICA ) 150 MG capsule Take 1 capsule (150 mg total) by mouth 2 (two) times daily. 08/07/24   Dino Antu, MD  rosuvastatin  (CRESTOR ) 5 MG tablet Take 5 mg by mouth daily.    [provider]  trospium (SANCTURA) 20 MG tablet Take 20 mg by mouth 2 (two) times daily.    [provider]    Physical Exam: Vitals:   08/19/24 1040 08/19/24 1100 08/19/24 1130 08/19/24 1353  BP:  (!) 108/56 (!) 98/57 107/77  Pulse:  70 71 64  Resp:  19 10 14   Temp:    98 F (36.7 C)  TempSrc:      SpO2:  96% 97% 100%  Weight: 97 kg     Height: 5' 7 (1.702 m)       Physical Exam   Constitutional: Sleepy not responding to verbal or tactile stimuli HEENT: Neck supple Respiratory: Clear to auscultation B/L, no wheezing, no rales.  Cardiovascular: Regular rate and rhythm, no murmurs / rubs / gallops. No extremity edema. 2+ pedal pulses. No carotid bruits.  Abdomen: Soft, no tenderness, Bowel sounds positive.  Musculoskeletal: no clubbing / cyanosis. Good ROM, no  contractures. Normal muscle tone.  Skin: no rashes, lesions, ulcers. Neurologic: She is sleepy and not able to follow commands Psychiatric: Sleepy  Labs on Admission: I have personally reviewed following labs and imaging studies  CBC: Recent Labs  Lab 08/13/24 0331 08/14/24 0542 08/15/24 0502 08/19/24 0957  WBC 7.3 7.9 7.1 10.6*  HGB 10.1* 10.7* 10.5* 11.4*  HCT 31.2* 33.0* 32.1* 35.2*  MCV 92.0 92.4 91.7 92.1  PLT 199 215 215 216   Basic Metabolic Panel: Recent Labs  Lab 08/13/24 0331 08/14/24 0542 08/15/24 0502 08/19/24 0957  NA 140 139 136 140  K 3.5 3.8 3.6 3.6  CL 102 102 101 100  CO2 28 28 29 25   GLUCOSE 144* 259* 203* 139*  BUN 9 14 13 16   CREATININE 0.93 0.98 0.86 1.66*  CALCIUM  9.1 9.3 9.0 9.8   GFR: Estimated Creatinine Clearance: 45.3 mL/min (A) (by C-G formula based on SCr of 1.66 mg/dL (H)). Liver Function Tests: Recent Labs  Lab 08/19/24 0957  AST 50*  ALT 24  ALKPHOS 59  BILITOT 0.7  PROT 7.9  ALBUMIN 4.3   No results for input(s): LIPASE, AMYLASE in the last 168 hours. Recent Labs  Lab 08/19/24 1436  AMMONIA 42*   Coagulation Profile: No results for input(s): INR, PROTIME in the last 168 hours. Cardiac Enzymes: No results for input(s): CKTOTAL, CKMB, CKMBINDEX, TROPONINI, TROPONINIHS in the last 168 hours. BNP (last 3 results) Recent Labs    05/20/24 1114  BNP 25.6   HbA1C: No results for input(s): HGBA1C in the last 72 hours. CBG: Recent Labs  Lab 08/14/24 1627 08/14/24 2038 08/14/24 2356 08/15/24 0906 08/19/24 0958  GLUCAP 162* 199* 210* 161* 128*   Lipid Profile: No results for input(s): CHOL, HDL, LDLCALC, TRIG, CHOLHDL, LDLDIRECT in the last 72 hours. Thyroid Function Tests: No results for input(s): TSH, T4TOTAL, FREET4, T3FREE, THYROIDAB in the last 72 hours. Anemia Panel: No results for input(s): VITAMINB12, FOLATE, FERRITIN, TIBC, IRON, RETICCTPCT in the last  72 hours. Urine analysis:    Component Value Date/Time   COLORURINE YELLOW (A) 08/10/2024 2236   APPEARANCEUR HAZY (A) 08/10/2024 2236   LABSPEC 1.013 08/10/2024 2236   PHURINE 5.0 08/10/2024  2236   GLUCOSEU NEGATIVE 08/10/2024 2236   HGBUR NEGATIVE 08/10/2024 2236   BILIRUBINUR NEGATIVE 08/10/2024 2236   KETONESUR NEGATIVE 08/10/2024 2236   PROTEINUR NEGATIVE 08/10/2024 2236   NITRITE NEGATIVE 08/10/2024 2236   LEUKOCYTESUR LARGE (A) 08/10/2024 2236    Radiological Exams on Admission: I have personally reviewed images MR BRAIN WO CONTRAST Result Date: 08/19/2024 EXAM: MR Brain without Intravenous Contrast. CLINICAL HISTORY: Mental status change, unknown cause. Pt arrives via EMS from home. Pt was reportedly recently admitted and discharged for a UTI. Patient's spouse reported to EMS that she has had ongoing confusion and lethargy since being released. Pt arrives alert to voice and has some confusion noted. TECHNIQUE: Magnetic resonance images of the brain without intravenous contrast in multiple planes. CONTRAST: Without. COMPARISON: CT head without contrast 08/19/2024. FINDINGS: BRAIN: No restricted diffusion to indicate acute infarction. No intracranial mass or hemorrhage. No midline shift or extra-axial fluid collection. No cerebellar tonsillar ectopia. The central arterial and venous flow voids are patent. Periventricular and scattered subcortical T2 hyperintensities bilaterally advanced for age. This most likely reflects the sequelae of chronic microvascular ischemia. VENTRICLES: No hydrocephalus. ORBITS: The orbits are normal. SINUSES AND MASTOIDS: The sinuses and mastoid air cells are clear. BONES: No acute fracture or focal osseous lesion. IMPRESSION: 1. No acute findings. 2. Periventricular and scattered subcortical T2 hyperintensities bilaterally advanced for age, most likely reflecting sequelae of chronic microvascular ischemia. Electronically signed by: Lonni Necessary MD  08/19/2024 02:51 PM EDT RP Workstation: [id number]   CT Head Wo Contrast Result Date: 08/19/2024 EXAM: CT HEAD WITHOUT CONTRAST 08/19/2024 10:55:39 AM TECHNIQUE: CT of the head was performed without the administration of intravenous contrast. Automated exposure control, iterative reconstruction, and/or weight based adjustment of the mA/kV was utilized to reduce the radiation dose to as low as reasonably achievable. COMPARISON: CT head without contrast 08/10/2024. CLINICAL HISTORY: Mental status change, unknown cause. Pt was reportedly recently admitted and discharged for a UTI. Patient's spouse reported to EMS that she has had ongoing confusion and lethargy since being released. Pt arrives alert to voice and has some confusion noted. FINDINGS: BRAIN AND VENTRICLES: No acute hemorrhage. No evidence of acute infarct. No hydrocephalus. No extra-axial collection. No mass effect or midline shift. ORBITS: No acute abnormality. SINUSES: No acute abnormality. SOFT TISSUES AND SKULL: No acute soft tissue abnormality. No skull fracture. IMPRESSION: 1. No acute intracranial abnormality. Electronically signed by: Lonni Necessary MD 08/19/2024 10:59 AM EDT RP Workstation: [id number]   DG Chest Portable 1 View Result Date: 08/19/2024 CLINICAL DATA:  Weakness EXAM: PORTABLE CHEST 1 VIEW COMPARISON:  Chest radiograph dated 10/13/2021 FINDINGS: Low lung volumes with bronchovascular crowding. Bibasilar patchy opacities. No pleural effusion or pneumothorax. The heart size and mediastinal contours are within normal limits. No acute osseous abnormality. IMPRESSION: Low lung volumes with bronchovascular crowding. Bibasilar patchy opacities, likely atelectasis. Aspiration or pneumonia can be considered in the appropriate clinical setting. Electronically Signed   By: Limin  Xu M.D.   On: 08/19/2024 10:32    EKG: My personal interpretation of EKG shows: Sinus rhythm no ST elevation    Assessment/Plan Principal Problem:    AMS (altered mental status) Active Problems:   Uncontrolled type 2 diabetes mellitus with hyperglycemia, with long-term current use of insulin  (HCC)   AKI (acute kidney injury)   History of stroke   Fibromyalgia   Bipolar II disorder (HCC)   Depression with anxiety    Assessment and Plan: 58 year old female with history of DM, chronic pain,  asthma/COPD, history of stroke, fibromyalgia, bipolar, depression, recurrent admissions for UTI and change in mental status who was brought in for acute change in mental status.  1.  Acute change in mental status - She will be placed in observation - This could be multifactorial, has mild AKI and likely polypharmacy causing the problem. - Workup so far is negative including source of infection and brain imaging. - Ammonia level is 42 suggesting that this may not be the case for this AMS - Supportive care - She will be n.p.o. and IV fluids until she is able to respond - I will not give her any antibiotics at this point due to multiple treatments for UTI in the past. - Monitor in the hospital overnight  2.  AKI - This may have contributed some for AMS - Will give her gentle hydration - Will avoid nephrotoxic drugs - Will monitor kidney function  3.  Diabetes - It appears that she takes a high dose of insulin  - Will place her on sliding scale while she is n.p.o. - Will resume her home dose of medication once she is able to eat and drink  4.  Hypertension/hyperlipidemia - Will hold off all the blood pressure medication except metoprolol - Resume Crestor   5.  Psychotropic medications - Will keep Subutex . - I will hold off Cogentin , buspirone Lexapro  and Invega  and Lyrica  until she is able to wake up - Then we have to decrease as many medication as possible  6.  Chronic constipation - She is supposed to be on MiraLAX GlycoLax packet as well as lactulose and laziness - I will continue only lactulose at this point - We can add other  medications when she is able to wake up and respond     DVT prophylaxis: Lovenox  Code Status: Full Code Family Communication: EDP  Disposition Plan: Home  Consults called: None  Admission status: Observation, Telemetry bed   Nena Rebel, MD [facility] 08/19/2024, 3:53 PM

## 2024-08-20 DIAGNOSIS — R4182 Altered mental status, unspecified: Secondary | ICD-10-CM | POA: Diagnosis not present

## 2024-08-20 DIAGNOSIS — G9340 Encephalopathy, unspecified: Secondary | ICD-10-CM | POA: Diagnosis not present

## 2024-08-20 LAB — COMPREHENSIVE METABOLIC PANEL WITH GFR
ALT: 25 U/L (ref 0–44)
AST: 51 U/L — ABNORMAL HIGH (ref 15–41)
Albumin: 3.6 g/dL (ref 3.5–5.0)
Alkaline Phosphatase: 51 U/L (ref 38–126)
Anion gap: 13 (ref 5–15)
BUN: 12 mg/dL (ref 6–20)
CO2: 27 mmol/L (ref 22–32)
Calcium: 9.4 mg/dL (ref 8.9–10.3)
Chloride: 104 mmol/L (ref 98–111)
Creatinine, Ser: 1.08 mg/dL — ABNORMAL HIGH (ref 0.44–1.00)
GFR, Estimated: >60 mL/min (ref >60)
Glucose, Bld: 90 mg/dL (ref 70–99)
Potassium: 3.4 mmol/L — ABNORMAL LOW (ref 3.5–5.1)
Sodium: 144 mmol/L (ref 135–145)
Total Bilirubin: 0.9 mg/dL (ref 0.0–1.2)
Total Protein: 6.9 g/dL (ref 6.5–8.1)

## 2024-08-20 LAB — CBC
HCT: 33.4 % — ABNORMAL LOW (ref 36.0–46.0)
Hemoglobin: 10.6 g/dL — ABNORMAL LOW (ref 12.0–15.0)
MCH: 29.4 pg (ref 26.0–34.0)
MCHC: 31.7 g/dL (ref 30.0–36.0)
MCV: 92.8 fL (ref 80.0–100.0)
Platelets: 194 K/uL (ref 150–400)
RBC: 3.6 MIL/uL — ABNORMAL LOW (ref 3.87–5.11)
RDW: 12.7 % (ref 11.5–15.5)
WBC: 7.6 K/uL (ref 4.0–10.5)
nRBC: 0 % (ref 0.0–0.2)

## 2024-08-20 LAB — GLUCOSE, CAPILLARY
Glucose-Capillary: 88 mg/dL (ref 70–99)
Glucose-Capillary: 211 mg/dL — ABNORMAL HIGH (ref 70–99)

## 2024-08-20 LAB — PROTIME-INR
INR: 1.3 — ABNORMAL HIGH (ref 0.8–1.2)
Prothrombin Time: 16.5 s — ABNORMAL HIGH (ref 11.4–15.2)

## 2024-08-20 MED ORDER — ORAL CARE MOUTH RINSE: 15.0000 mL | OROMUCOSAL | Status: DC | PRN

## 2024-08-20 MED ORDER — BACLOFEN 10 MG PO TABS
5.0000 mg | ORAL_TABLET | Freq: Once | ORAL | Status: AC
  Administered 2024-08-20: 5 mg via ORAL
  Filled 2024-08-20: qty 1

## 2024-08-20 MED ORDER — ORAL CARE MOUTH RINSE
15.0000 mL | OROMUCOSAL | Status: DC
  Administered 2024-08-20 (×2): 15 mL via OROMUCOSAL

## 2024-08-20 NOTE — Plan of Care (Signed)
  Problem: Coping: Goal: Ability to adjust to condition or change in health will improve Outcome: Progressing   Problem: Metabolic: Goal: Ability to maintain appropriate glucose levels will improve Outcome: Progressing   Problem: Pain Managment: Goal: General experience of comfort will improve and/or be controlled Outcome: Progressing   Problem: Safety: Goal: Ability to remain free from injury will improve Outcome: Progressing

## 2024-08-20 NOTE — Discharge Instructions (Signed)
 Please carefully review your medication list and compare it to the medications you take daily.  It looks like you were mistakenly taking more than what was prescribed.  I have ordered home health care with a nurse to come out and help go over your medications with you at home.  Make an appointment with your PCP and psychiatrist as soon as possible as well to discuss this

## 2024-08-20 NOTE — Plan of Care (Signed)
 Problem: Education: Goal: Ability to describe self-care measures that may prevent or decrease complications (Diabetes Survival Skills Education) will improve 08/20/2024 1700 by [name] Donald LABOR, RN Outcome: Adequate for Discharge 08/20/2024 1349 by [name] Donald LABOR, RN Outcome: Progressing Goal: Individualized Educational Video(s) 08/20/2024 1700 by [name] Donald LABOR, RN Outcome: Adequate for Discharge 08/20/2024 1349 by [name] Donald LABOR, RN Outcome: Progressing   Problem: Coping: Goal: Ability to adjust to condition or change in health will improve 08/20/2024 1700 by [name] Donald LABOR, RN Outcome: Adequate for Discharge 08/20/2024 1349 by [name] Donald LABOR, RN Outcome: Progressing   Problem: Fluid Volume: Goal: Ability to maintain a balanced intake and output will improve 08/20/2024 1700 by [name] Donald LABOR, RN Outcome: Adequate for Discharge 08/20/2024 1349 by [name] Donald LABOR, RN Outcome: Progressing   Problem: Health Behavior/Discharge Planning: Goal: Ability to identify and utilize available resources and services will improve 08/20/2024 1700 by [name] Donald LABOR, RN Outcome: Adequate for Discharge 08/20/2024 1349 by [name] Donald LABOR, RN Outcome: Progressing Goal: Ability to manage health-related needs will improve 08/20/2024 1700 by [name] Donald LABOR, RN Outcome: Adequate for Discharge 08/20/2024 1349 by [name] Donald LABOR, RN Outcome: Progressing   Problem: Metabolic: Goal: Ability to maintain appropriate glucose levels will improve 08/20/2024 1700 by [name] Donald LABOR, RN Outcome: Adequate for Discharge 08/20/2024 1349 by [name] Donald LABOR, RN Outcome: Progressing   Problem: Nutritional: Goal: Maintenance of adequate nutrition will improve 08/20/2024 1700 by [name] Donald LABOR, RN Outcome: Adequate for Discharge 08/20/2024 1349 by [name] Donald LABOR, RN Outcome: Progressing Goal: Progress toward achieving an optimal weight will improve 08/20/2024 1700 by [name] Donald LABOR, RN Outcome:  Adequate for Discharge 08/20/2024 1349 by [name] Donald LABOR, RN Outcome: Progressing   Problem: Skin Integrity: Goal: Risk for impaired skin integrity will decrease 08/20/2024 1700 by [name] Donald LABOR, RN Outcome: Adequate for Discharge 08/20/2024 1349 by [name] Donald LABOR, RN Outcome: Progressing   Problem: Tissue Perfusion: Goal: Adequacy of tissue perfusion will improve 08/20/2024 1700 by [name] Donald LABOR, RN Outcome: Adequate for Discharge 08/20/2024 1349 by [name] Donald LABOR, RN Outcome: Progressing   Problem: Education: Goal: Knowledge of General Education information will improve Description: Including pain rating scale, medication(s)/side effects and non-pharmacologic comfort measures 08/20/2024 1700 by [name] Donald LABOR, RN Outcome: Adequate for Discharge 08/20/2024 1349 by [name] Donald LABOR, RN Outcome: Progressing   Problem: Health Behavior/Discharge Planning: Goal: Ability to manage health-related needs will improve 08/20/2024 1700 by [name] Donald LABOR, RN Outcome: Adequate for Discharge 08/20/2024 1349 by [name] Donald LABOR, RN Outcome: Progressing   Problem: Clinical Measurements: Goal: Ability to maintain clinical measurements within normal limits will improve 08/20/2024 1700 by [name] Donald LABOR, RN Outcome: Adequate for Discharge 08/20/2024 1349 by [name] Donald LABOR, RN Outcome: Progressing Goal: Will remain free from infection 08/20/2024 1700 by [name] Donald LABOR, RN Outcome: Adequate for Discharge 08/20/2024 1349 by [name] Donald LABOR, RN Outcome: Progressing Goal: Diagnostic test results will improve 08/20/2024 1700 by [name] Donald LABOR, RN Outcome: Adequate for Discharge 08/20/2024 1349 by [name] Donald LABOR, RN Outcome: Progressing Goal: Respiratory complications will improve 08/20/2024 1700 by [name] Donald LABOR, RN Outcome: Adequate for Discharge 08/20/2024 1349 by [name] Donald LABOR, RN Outcome: Progressing Goal: Cardiovascular complication will be avoided 08/20/2024 1700  by [name] Donald LABOR, RN Outcome: Adequate for Discharge 08/20/2024 1349 by [name] Donald LABOR, RN Outcome: Progressing   Problem: Activity: Goal: Risk for activity intolerance will decrease 08/20/2024 1700 by [name] Donald LABOR, RN Outcome: Adequate for Discharge  08/20/2024 1349 by [name] Donald LABOR, RN Outcome: Progressing   Problem: Nutrition: Goal: Adequate nutrition will be maintained 08/20/2024 1700 by [name] Donald LABOR, RN Outcome: Adequate for Discharge 08/20/2024 1349 by [name] Donald LABOR, RN Outcome: Progressing   Problem: Coping: Goal: Level of anxiety will decrease 08/20/2024 1700 by [name] Donald LABOR, RN Outcome: Adequate for Discharge 08/20/2024 1349 by [name] Donald LABOR, RN Outcome: Progressing   Problem: Elimination: Goal: Will not experience complications related to bowel motility 08/20/2024 1700 by [name] Donald LABOR, RN Outcome: Adequate for Discharge 08/20/2024 1349 by [name] Donald LABOR, RN Outcome: Progressing Goal: Will not experience complications related to urinary retention 08/20/2024 1700 by [name] Donald LABOR, RN Outcome: Adequate for Discharge 08/20/2024 1349 by [name] Donald LABOR, RN Outcome: Progressing   Problem: Pain Managment: Goal: General experience of comfort will improve and/or be controlled 08/20/2024 1700 by [name] Donald LABOR, RN Outcome: Adequate for Discharge 08/20/2024 1349 by [name] Donald LABOR, RN Outcome: Progressing   Problem: Safety: Goal: Ability to remain free from injury will improve 08/20/2024 1700 by [name] Donald LABOR, RN Outcome: Adequate for Discharge 08/20/2024 1349 by [name] Donald LABOR, RN Outcome: Progressing   Problem: Skin Integrity: Goal: Risk for impaired skin integrity will decrease 08/20/2024 1700 by [name] Donald LABOR, RN Outcome: Adequate for Discharge 08/20/2024 1349 by [name] Donald LABOR, RN Outcome: Progressing

## 2024-08-20 NOTE — TOC Initial Note (Signed)
 Transition of Care [facility]) - Initial/Assessment Note    Patient Details  Name: Alicia Mayo MRN: [id number] Date of Birth: 1966-05-15  [facility] CM/SW Contact:    Marinda Cooks, RN Phone Number: 08/20/2024, 2:09 PM  Clinical Narrative:                 This CM spoke with pt introduced role  and completed Initial assessement. Pt A&Ox 4 .Pt reports living in a 1 st level Apt w/o steps to enter. Pt has support provided through her Cap Program funded through her [employer]. The agency that provides the [facility] services for CAP is [employer] [phone]. This CM called and spoke with Agency Owner Aleene Ko per request of pt and informed pt receives [facility] Aide  services that include 7hrs Mon- Fri ,Sat 3 hrs a day , & Sun 2 hrs a day. HHA worker's are pt's emergency contact [name]. At [phone] & Twyla. Pt uses [facility] & PCP is Dr. Elsie Rubinstein  last seen September of this yr 2025 . Pt DME .Pt reports  she has DME at home that includes [facility], Electric W/C, shower chair,&RW.   Pt shared she not  been in SNF prior to  this admission. Pt reports no  HH services for PT/OT . Pt's transportation is provided by her care giver Mr. Rosalynn.  This CM dicussed DC plan / PT recommendations with pt and she verbalized understanding and informed she did not have a preference . Pt denied having an HH agency preference for the reccommended PT/OT & SW . This CM coordinated HH services with [facility] referral received and confirmed by [facility] admission Liaison .   TOC  will cont to follow pt's dc planning / care coordination  during hospital stay and update as applicable.       Expected Discharge Plan and Services      To Return Home with [facility] & Cap services that provide [facility] Aide services .                                         Prior Living Arrangements/Services  Home with Cap services                      Activities of Daily Living  Total Care per pt       Admission diagnosis:  Acute encephalopathy [G93.40] AMS (altered mental status) [R41.82] Patient Active Problem List   Diagnosis Date Noted   AMS (altered mental status) 08/19/2024   Obesity (BMI 30-39.9) 08/11/2024   UTI (urinary tract infection) 08/10/2024   Diabetes mellitus with complication of gastroparesis 08/10/2024   COPD (chronic obstructive pulmonary disease) (HCC) 08/10/2024   Depression with anxiety 08/10/2024   Lethargy 08/03/2024   Hypotension 08/03/2024   Urinary tract infection 08/03/2024   Uncontrolled type 2 diabetes mellitus with hyperglycemia, with long-term current use of insulin  (HCC) 08/03/2024   AKI (acute kidney injury) 08/03/2024   Chronic pain 08/03/2024   Electrolyte abnormality 08/03/2024   Altered mental status 08/03/2024   Onychocryptosis 10/28/2020   Anxiety 10/09/2020   Chest pain 06/17/2020   Uncontrolled type 2 diabetes mellitus with diabetic polyneuropathy, with long-term current use of insulin  01/25/2020   Gastroparesis 10/23/2019   Moderate episode of recurrent major depressive disorder (HCC) 12/04/2018   Encephalopathy, toxic 08/27/2018   Cognitive deficit S/P  CVA (cerebrovascular accident) 06/22/2018   Lesion of vulva 01/21/2018   Onychomycosis 09/17/2016   History of stroke 08/27/2016   Essential hypertension 02/03/2016   Falls frequently 10/03/2014   Hypokalemia 10/03/2014   NASH (nonalcoholic steatohepatitis) 04/03/2014   Leg cramps 06/26/2013   Bipolar II disorder (HCC) 02/02/2013   PTSD (post-traumatic stress disorder) 01/24/2013   Esophageal reflux 03/01/2012   Fibromyalgia 09/20/2011   Insomnia 09/20/2011   Osteoarthritis 09/20/2011   Asthma 05/09/2011   PCP:  Dante Elsie Celestia MADISON, MD Pharmacy:   [facility] [phone] - [place], [place] - [address] [place] [address] Phone: [phone] Fax: [phone]     Social Drivers of Health (SDOH) Social History: SDOH Screenings   Food Insecurity: No  Food Insecurity (08/20/2024)  Housing: Low Risk  (08/20/2024)  Transportation Needs: No Transportation Needs (08/20/2024)  Utilities: Not At Risk (08/20/2024)  Financial Resource Strain: Low Risk  (08/31/2023)   Received from [facility]  Physical Activity: Inactive (11/03/2022)   Received from [facility]  Social Connections: Socially Isolated (11/03/2022)   Received from [facility]  Stress: No Stress Concern Present (11/03/2022)   Received from [facility]  Tobacco Use: High Risk (08/19/2024)   SDOH Interventions:     Readmission Risk Interventions     No data to display

## 2024-08-20 NOTE — Evaluation (Signed)
 Physical Therapy Evaluation Patient Details Name: Alicia Mayo MRN: [id number] DOB: December 11, 1965 Today's Date: 08/20/2024  History of Present Illness  Alicia Mayo is a pleasant 58 y.o. female with medical history significant for bipolar, anxiety/depression, PTSD, multiple strokes with right-sided weakness, HTN, DM, COPD/asthma, obesity, gastroparesis, fibromyalgia on Subutex , recurrent UTIs and multiple admissions for altered mental status who was brought in for acute change in mental status.  She was recently hospitalized between 08/02/2024 and 08/07/2024, 10/2 - 08/15/2024.  She was brought in from home today as patient's spouse called EMS for ongoing confusion and lethargy since being released from the hospital.  When EMS arrived patient was alert to voice and has some confusion noted.  Patient was sleepy and not able to provide any single information.  Clinical Impression  Pt is a pleasant 58 year old female who was admitted for AMS. Pt performs bed mobility with mod I, transfers with min assist, and ambulation with supervision and RW. Pt demonstrates deficits with strength/mobility/endurance. Would benefit from skilled PT to address above deficits and promote optimal return to PLOF. Is requesting new DME as hers is very old and possibly non functional. Sent secure chat to [facility] to facilitate. Pt will continue to receive skilled PT services while admitted and will defer to TOC/care team for updates regarding disposition planning.         If plan is discharge home, recommend the following: A little help with walking and/or transfers;A little help with bathing/dressing/bathroom;Assist for transportation;Assistance with cooking/housework   Can travel by private Tax inspector (2 wheels);BSC/3in1  Recommendations for Other Services       Functional Status Assessment Patient has had a recent decline in their functional status and demonstrates the ability  to make significant improvements in function in a reasonable and predictable amount of time.     Precautions / Restrictions Precautions Precautions: Fall Recall of Precautions/Restrictions: Impaired Restrictions Weight Bearing Restrictions Per Provider Order: No      Mobility  Bed Mobility Overal bed mobility: Modified Independent Bed Mobility: Supine to Sit, Sit to Supine     Supine to sit: Modified independent (Device/Increase time)     General bed mobility comments: safe technique, does take extended time to complete task    Transfers Overall transfer level: Needs assistance Equipment used: Rolling walker (2 wheels) Transfers: Sit to/from Stand, Bed to chair/wheelchair/BSC Sit to Stand: Min assist           General transfer comment: unable to stand from low surface without min assist. Cues for rocking momentum prior to transfer. Once standing, upright posture noted    Ambulation/Gait Ambulation/Gait assistance: Supervision Gait Distance (Feet): 200 Feet Assistive device: Rolling walker (2 wheels) Gait Pattern/deviations: Step-to pattern       General Gait Details: ambulated with slow speed and initial step to gait, however progressed to short recipocal gait. Keeps head towards ground unless directly cued. Able to carry conversation with exertion  Stairs            Wheelchair Mobility     Tilt Bed    Modified Rankin (Stroke Patients Only)       Balance Overall balance assessment: Needs assistance Sitting-balance support: No upper extremity supported, Feet supported Sitting balance-Leahy Scale: Good     Standing balance support: Bilateral upper extremity supported Standing balance-Leahy Scale: Fair  Pertinent Vitals/Pain Pain Assessment Pain Assessment: No/denies pain (does report nausea)    Home Living Family/patient expects to be discharged to:: Private residence Living Arrangements:  Alone Available Help at Discharge: Personal care attendant;Available PRN/intermittently Type of Home: Apartment Home Access: Level entry         Home Equipment: Rolling Walker (2 wheels);Cane - single point;Shower seat;Electric scooter;BSC/3in1;Wheelchair - manual Additional Comments: recently moved to a handicapped accessable apartment. Pt reports her AD is very old and is requesting replacement    Prior Function Prior Level of Function : Needs assist             Mobility Comments: Sup household distances with SPC vs RW. She states she can get in/out of bed mod I depending on the day. ADLs Comments: Pt has an aide that comes M-F for 8 hours (divided up during the day), Sat 5 hrs, and Sun 4 hours to assist with IADLs and ADL tasks. Pt endorses ambulation with cane, RW, or electric scooter depending on how she feels each day.     Extremity/Trunk Assessment   Upper Extremity Assessment Upper Extremity Assessment: Generalized weakness    Lower Extremity Assessment Lower Extremity Assessment: Generalized weakness       Communication   Communication Communication: No apparent difficulties    Cognition Arousal: Alert Behavior During Therapy: WFL for tasks assessed/performed   PT - Cognitive impairments: No apparent impairments                       PT - Cognition Comments: very pleasant and agreeable to session. A&O x 4 Following commands: Intact       Cueing Cueing Techniques: Verbal cues, Gestural cues, Tactile cues, Visual cues     General Comments      Exercises Other Exercises Other Exercises: ambulated to bathroom. Able to sit<>Stand from low toilet with min/mod assist and use of railing. Able to perform self hygiene with supervision. Other Exercises: Assisted in changing bed linens and donning new gown for patient. Also assisted with donning mesh undies and a pad due to urgency incontience.   Assessment/Plan    PT Assessment Patient needs  continued PT services  PT Problem List Decreased strength;Decreased activity tolerance;Decreased balance;Decreased mobility;Decreased coordination;Decreased cognition;Decreased knowledge of use of DME;Decreased safety awareness;Decreased knowledge of precautions;Pain;Decreased range of motion       PT Treatment Interventions DME instruction;Gait training;Stair training;Functional mobility training;Therapeutic exercise;Therapeutic activities;Balance training;Patient/family education;Cognitive remediation;Neuromuscular re-education    PT Goals (Current goals can be found in the Care Plan section)  Acute Rehab PT Goals Patient Stated Goal: to walk a little bit PT Goal Formulation: With patient Time For Goal Achievement: 09/03/24 Potential to Achieve Goals: Good    Frequency Min 2X/week     Co-evaluation               AM-PAC PT 6 Clicks Mobility  Outcome Measure Help needed turning from your back to your side while in a flat bed without using bedrails?: A Little Help needed moving from lying on your back to sitting on the side of a flat bed without using bedrails?: A Little Help needed moving to and from a bed to a chair (including a wheelchair)?: A Little Help needed standing up from a chair using your arms (e.g., wheelchair or bedside chair)?: A Little Help needed to walk in hospital room?: A Little Help needed climbing 3-5 steps with a railing? : A Lot 6 Click Score: 17    End  of Session   Activity Tolerance: Patient tolerated treatment well Patient left: in chair;with call bell/phone within reach;with chair alarm set Nurse Communication: Mobility status PT Visit Diagnosis: Muscle weakness (generalized) (M62.81);Difficulty in walking, not elsewhere classified (R26.2)    Time: 8952-8885 PT Time Calculation (min) (ACUTE ONLY): 27 min   Charges:   PT Evaluation $PT Eval Low Complexity: 1 Low PT Treatments $Gait Training: 8-22 mins PT General Charges $$ ACUTE PT  VISIT: 1 Visit         Corean Dade, PT, DPT, GCS [phone]   Pearlina Friedly 08/20/2024, 12:57 PM

## 2024-08-20 NOTE — Discharge Summary (Signed)
 Physician Discharge Summary  Patient: Alicia Mayo FMW:[id number] DOB: 1966-05-10   Code Status: Full Code Admit date: 08/19/2024 Discharge date: 08/20/2024 Disposition: Home health, RN PCP: Dante Elsie Celestia MADISON, MD  Recommendations for Outpatient Follow-up:  Follow up with PCP within 1-2 weeks Regarding general hospital follow up and preventative care Recommend reviewing medications and how she is taking them. Confirmed that she was accidentally taking double the prescribed medications due to confusion on the administration instructions  Follow up with psychiatry   Discharge Diagnoses:  Principal Problem:   AMS (altered mental status) Active Problems:   Uncontrolled type 2 diabetes mellitus with hyperglycemia, with long-term current use of insulin  (HCC)   AKI (acute kidney injury)   History of stroke   Fibromyalgia   Bipolar II disorder (HCC)   Depression with anxiety   Acute encephalopathy  Brief Hospital Course Summary: Alicia Mayo is a pleasant 58 y.o. female with medical history significant for bipolar, anxiety/depression, PTSD, multiple strokes with right-sided weakness, HTN, DM, COPD/asthma, obesity, gastroparesis, fibromyalgia on Subutex , recurrent UTIs and multiple admissions for altered mental status 08/02/2024 and 08/07/2024, 10/2 - 08/15/2024.  They presented with similar symptoms this admission.  Her home psychiatric medications were held on presentation and she completely recovered to baseline so was likely a hypoactive delirium from her medication effects. Her workup including head imaging and labs were otherwise completely unremarkable for cause.  I had her caregiver bring in her home medications for review and we discovered that she has been inadvertently taking double the prescribed medication due to confusion on her pill packs and the additional medications that come in bottles.  Psychiatry reviewed these medications and provided education on the proper  administration. Patient and caregiver expressed understanding.   Psychiatry consult recommended close follow up with her outpatient psychiatrist to review her medications and suggested consolidating lexapro  and latuda  at night to help prevent confusion and other schedule changes that may simplify her regimen to avoid accidental overdoses from confusion again.   All her chronic conditions were treated with home medications and unchanged at dc.    Discharge Condition: Good, improved Recommended discharge diet: Regular healthy diet  Consultations: Psychiatry   Procedures/Studies: None   Allergies as of 08/20/2024       Reactions   Celecoxib Other (See Comments), Swelling   Other Reaction: OTHER REACTION = SWELLING   Gabapentin Rash, Swelling   Pt says she gets face and throat swelling.   Levetiracetam Rash   Oxycodone-acetaminophen  Itching, Nausea Only, Swelling   Pt states that nausea was the most significant effect. Pt states that percocet is tolerable when given ondansetron  (ZOFRAN ).   Quetiapine Other (See Comments), Rash   Increased blood sugars.   Shellfish Allergy Swelling   Scallops, shellfish => swelling   Soap Swelling   [place] soap   Acetaminophen  Nausea And Vomiting   Alprazolam Other (See Comments)   Memory loss   Atorvastatin    Other reaction(s): Muscle Pain   Diazepam Other (See Comments)   Memory loss   Dicyclomine Other (See Comments)   Over-sedation   Ibuprofen Nausea And Vomiting   Other Reaction: TONGUE SWELING/BLEEDING   Ivp Dye [iodinated Contrast Media] Itching   Oxycodone Itching, Swelling   Swelling of tongue   Propoxyphene    With tylenol    Sertraline Other (See Comments)   Other reaction(s): Unknown unknown   Trazodone Other (See Comments)   Over-sedation   Acetaminophen -codeine Itching, Nausea Only   Aspirin Nausea  Only, Swelling   Sinus swelling   Codeine Itching, Nausea Only   No associated rash. Itching stops on its own after  a few days, but faster when given diphenhydramine.   Tramadol Swelling   Facial swelling   Zolpidem Nausea Only, Other (See Comments)   Causes memory loss        Medication List     STOP taking these medications    Insulin  Aspart FlexPen 100 UNIT/ML Commonly known as: NOVOLOG        TAKE these medications    albuterol 108 (90 Base) MCG/ACT inhaler Commonly known as: VENTOLIN HFA Inhale 2 puffs into the lungs every 4 (four) hours as needed.   ammonium lactate 12 % cream Commonly known as: AMLACTIN Apply 1 Application topically as needed.   benztropine  1 MG tablet Commonly known as: COGENTIN  Take 0.5 tablets (0.5 mg total) by mouth 2 (two) times daily as needed for tremors.   buprenorphine  2 MG Subl SL tablet Commonly known as: SUBUTEX  Place 2 mg under the tongue in the morning, at noon, and at bedtime.   busPIRone 15 MG tablet Commonly known as: BUSPAR Take 15 mg by mouth 3 (three) times daily.   escitalopram  20 MG tablet Commonly known as: LEXAPRO  Take 20 mg by mouth daily.   fexofenadine 180 MG tablet Commonly known as: ALLEGRA Take 180 mg by mouth daily.   fluticasone 50 MCG/ACT nasal spray Commonly known as: FLONASE Place 2 sprays into the nose daily.   fluticasone-salmeterol 250-50 MCG/ACT Aepb Commonly known as: ADVAIR Inhale 1 puff into the lungs 2 (two) times daily.   furosemide  20 MG tablet Commonly known as: LASIX  Take 20 mg by mouth daily.   Invega  Sustenna 234 MG/1.5ML injection Generic drug: paliperidone  Inject 234 mg into the muscle once.   lactulose 10 GM/15ML solution Commonly known as: CHRONULAC Take 45 mLs (30 g total) by mouth daily as needed for moderate constipation or severe constipation.   lidocaine  5 % Commonly known as: LIDODERM  Place 1 patch onto the skin daily.   Linzess 290 MCG Caps capsule Generic drug: linaclotide Take 290 mcg by mouth daily.   losartan  100 MG tablet Commonly known as: COZAAR  Take 100 mg by  mouth daily.   Lurasidone  HCl 120 MG Tabs Take 1 tablet by mouth daily.   metFORMIN 500 MG 24 hr tablet Commonly known as: GLUCOPHAGE-XR Take 1,000 mg by mouth 2 (two) times daily.   metoprolol tartrate 50 MG tablet Commonly known as: LOPRESSOR Take 50 mg by mouth 2 (two) times daily.   Narcan 4 MG/0.1ML Liqd nasal spray kit Generic drug: naloxone Place 1 spray into the nose once.   ondansetron  4 MG disintegrating tablet Commonly known as: ZOFRAN -ODT Take 1 tablet (4 mg total) by mouth every 6 (six) hours as needed for nausea or vomiting.   paliperidone  3 MG 24 hr tablet Commonly known as: INVEGA  Take 2 tablets (6 mg total) by mouth at bedtime.   pantoprazole  40 MG tablet Commonly known as: PROTONIX  Take 40 mg by mouth daily.   polyethylene glycol 17 g packet Commonly known as: MIRALAX / GLYCOLAX Take 17 g by mouth daily as needed.   pregabalin  150 MG capsule Commonly known as: LYRICA  Take 1 capsule (150 mg total) by mouth 2 (two) times daily.   rosuvastatin  5 MG tablet Commonly known as: CRESTOR  Take 5 mg by mouth daily.   trospium 20 MG tablet Commonly known as: SANCTURA Take 20 mg by mouth 2 (two)  times daily.               Durable Medical Equipment  (From admission, onward)           Start     Ordered   08/20/24 1138  For home use only DME Bedside commode  Once       Question:  Patient needs a bedside commode to treat with the following condition  Answer:  Physical deconditioning   08/20/24 1137   08/20/24 1138  For home use only DME Walker rolling  Once       Question Answer Comment  Walker: With 5 Inch Wheels   Patient needs a walker to treat with the following condition Physical deconditioning      08/20/24 1137            Follow-up Information     Bynum, Elsie Bohr IV, MD. Schedule an appointment as soon as possible for a visit in 1 week(s).   Specialty: Family Medicine Contact information: [address] [place]  [address] [phone]                 Subjective   Pt reports feeling well. Denies hallucinations. Had no suicidal ideations.   All questions and concerns were addressed at time of discharge.  Objective  Blood pressure (!) 112/55, pulse 76, temperature 98.4 F (36.9 C), resp. rate 17, height 5' 7 (1.702 m), weight 97 kg, SpO2 96%.   General: Pt is alert, awake, not in acute distress Cardiovascular: RRR, S1/S2 +, no rubs, no gallops Respiratory: CTA bilaterally, no wheezing, no rhonchi Abdominal: Soft, NT, ND, bowel sounds + Extremities: no edema, no cyanosis  The results of significant diagnostics from this hospitalization (including imaging, microbiology, ancillary and laboratory) are listed below for reference.   Imaging studies: MR BRAIN WO CONTRAST Result Date: 08/19/2024 EXAM: MR Brain without Intravenous Contrast. CLINICAL HISTORY: Mental status change, unknown cause. Pt arrives via EMS from home. Pt was reportedly recently admitted and discharged for a UTI. Patient's spouse reported to EMS that she has had ongoing confusion and lethargy since being released. Pt arrives alert to voice and has some confusion noted. TECHNIQUE: Magnetic resonance images of the brain without intravenous contrast in multiple planes. CONTRAST: Without. COMPARISON: CT head without contrast 08/19/2024. FINDINGS: BRAIN: No restricted diffusion to indicate acute infarction. No intracranial mass or hemorrhage. No midline shift or extra-axial fluid collection. No cerebellar tonsillar ectopia. The central arterial and venous flow voids are patent. Periventricular and scattered subcortical T2 hyperintensities bilaterally advanced for age. This most likely reflects the sequelae of chronic microvascular ischemia. VENTRICLES: No hydrocephalus. ORBITS: The orbits are normal. SINUSES AND MASTOIDS: The sinuses and mastoid air cells are clear. BONES: No acute fracture or focal osseous lesion. IMPRESSION: 1. No acute  findings. 2. Periventricular and scattered subcortical T2 hyperintensities bilaterally advanced for age, most likely reflecting sequelae of chronic microvascular ischemia. Electronically signed by: Lonni Necessary MD 08/19/2024 02:51 PM EDT RP Workstation: [id number]   CT Head Wo Contrast Result Date: 08/19/2024 EXAM: CT HEAD WITHOUT CONTRAST 08/19/2024 10:55:39 AM TECHNIQUE: CT of the head was performed without the administration of intravenous contrast. Automated exposure control, iterative reconstruction, and/or weight based adjustment of the mA/kV was utilized to reduce the radiation dose to as low as reasonably achievable. COMPARISON: CT head without contrast 08/10/2024. CLINICAL HISTORY: Mental status change, unknown cause. Pt was reportedly recently admitted and discharged for a UTI. Patient's spouse reported to EMS that she has had  ongoing confusion and lethargy since being released. Pt arrives alert to voice and has some confusion noted. FINDINGS: BRAIN AND VENTRICLES: No acute hemorrhage. No evidence of acute infarct. No hydrocephalus. No extra-axial collection. No mass effect or midline shift. ORBITS: No acute abnormality. SINUSES: No acute abnormality. SOFT TISSUES AND SKULL: No acute soft tissue abnormality. No skull fracture. IMPRESSION: 1. No acute intracranial abnormality. Electronically signed by: Lonni Necessary MD 08/19/2024 10:59 AM EDT RP Workstation: [id number]   DG Chest Portable 1 View Result Date: 08/19/2024 CLINICAL DATA:  Weakness EXAM: PORTABLE CHEST 1 VIEW COMPARISON:  Chest radiograph dated 10/13/2021 FINDINGS: Low lung volumes with bronchovascular crowding. Bibasilar patchy opacities. No pleural effusion or pneumothorax. The heart size and mediastinal contours are within normal limits. No acute osseous abnormality. IMPRESSION: Low lung volumes with bronchovascular crowding. Bibasilar patchy opacities, likely atelectasis. Aspiration or pneumonia can be considered in the  appropriate clinical setting. Electronically Signed   By: Limin  Xu M.D.   On: 08/19/2024 10:32   CT Head Wo Contrast Result Date: 08/10/2024 EXAM: CT HEAD WITHOUT CONTRAST 08/10/2024 05:39:52 PM TECHNIQUE: CT of the head was performed without the administration of intravenous contrast. Automated exposure control, iterative reconstruction, and/or weight based adjustment of the mA/kV was utilized to reduce the radiation dose to as low as reasonably achievable. COMPARISON: CT head 08/02/2024. CLINICAL HISTORY: ams. Table formatting from the original note was not included.; Triage note:; Pt BIB EMS from home with concerns from family that pt was to sleepy. Per EMS pt was seen in the the ED and dx with a UTI. Per Ems family was concerned that pt would not wake up enough talk or take her medications. Pt is snoring during triage. Pt ; asked by this RN if she knew why she was her she shook her head no. Pt was asked if she knew why family was concerned. Pt again shook her head no. FINDINGS: BRAIN AND VENTRICLES: No acute hemorrhage. No evidence of acute infarct. No hydrocephalus. No extra-axial collection. No mass effect or midline shift. ORBITS: No acute abnormality. SINUSES: Similar minimal mucosal thickening in the paranasal sinuses. SOFT TISSUES AND SKULL: No acute soft tissue abnormality. No skull fracture. IMPRESSION: 1. No acute intracranial abnormality. Electronically signed by: Donnice Mania MD 08/10/2024 06:01 PM EDT RP Workstation: [id number]   CT ABDOMEN PELVIS WO CONTRAST Result Date: 08/10/2024 CLINICAL DATA:  Abdomen pain body aches EXAM: CT ABDOMEN AND PELVIS WITHOUT CONTRAST TECHNIQUE: Multidetector CT imaging of the abdomen and pelvis was performed following the standard protocol without IV contrast. RADIATION DOSE REDUCTION: This exam was performed according to the departmental dose-optimization program which includes automated exposure control, adjustment of the mA and/or kV according to patient  size and/or use of iterative reconstruction technique. COMPARISON:  CT 08/02/2024, 07/01/2023 FINDINGS: Lower chest: No acute abnormality. Hepatobiliary: No focal liver abnormality is seen. Status post cholecystectomy. Prominence of the common bile duct likely due to postsurgical change. This finding is unchanged. Pancreas: Unremarkable. No pancreatic ductal dilatation or surrounding inflammatory changes. Spleen: Normal in size without focal abnormality. Adrenals/Urinary Tract: Adrenal glands are unremarkable. Kidneys are normal, without renal calculi, focal lesion, or hydronephrosis. Bladder is unremarkable. Stomach/Bowel: Stomach within normal limits. No dilated small bowel. No acute bowel wall thickening. Negative appendix. Large stool burden Vascular/Lymphatic: Aortic atherosclerosis. No enlarged abdominal or pelvic lymph nodes. Reproductive: Prostate is unremarkable. Other: No abdominal wall hernia or abnormality. No abdominopelvic ascites. Musculoskeletal: No acute or significant osseous findings. IMPRESSION: 1. No CT evidence for  acute intra-abdominal or pelvic abnormality. 2. Large stool burden. 3. Aortic atherosclerosis. Aortic Atherosclerosis (ICD10-I70.0). Electronically Signed   By: Luke Bun M.D.   On: 08/10/2024 00:39   DG Abdomen 1 View Result Date: 08/09/2024 EXAM: 1 VIEW XRAY OF THE ABDOMEN 08/09/2024 09:56:36 PM COMPARISON: CT abdomen and pelvis from 08/02/2024. CLINICAL HISTORY: Constipation. Pt reports constipation x8 days and generalized body aches. Pt states she was just admitted to the hospital for AMS and UTI, pt was sent home with at home rehab. FINDINGS: BOWEL: Moderate to large colonic stool burden. SOFT TISSUES: Cholecystectomy. No opaque urinary calculi. BONES: No acute osseous abnormality. IMPRESSION: 1. Moderate to large colonic stool burden. Electronically signed by: Norman Gatlin MD 08/09/2024 09:59 PM EDT RP Workstation: [id number]   CT ABDOMEN PELVIS WO CONTRAST Result  Date: 08/02/2024 CLINICAL DATA:  Left lower quadrant pain. EXAM: CT ABDOMEN AND PELVIS WITHOUT CONTRAST TECHNIQUE: Multidetector CT imaging of the abdomen and pelvis was performed following the standard protocol without IV contrast. RADIATION DOSE REDUCTION: This exam was performed according to the departmental dose-optimization program which includes automated exposure control, adjustment of the mA and/or kV according to patient size and/or use of iterative reconstruction technique. COMPARISON:  July 01, 2023 FINDINGS: Lower chest: No acute abnormality. Hepatobiliary: No focal liver abnormality is seen. Status post cholecystectomy. No biliary dilatation. Pancreas: Unremarkable. No pancreatic ductal dilatation or surrounding inflammatory changes. Spleen: Normal in size without focal abnormality. Adrenals/Urinary Tract: Adrenal glands are unremarkable. Kidneys are normal, without renal calculi, focal lesion, or hydronephrosis. Bladder is unremarkable. Stomach/Bowel: Stomach is within normal limits. Appendix appears normal. No evidence of bowel wall thickening, distention, or inflammatory changes. Vascular/Lymphatic: Aortic atherosclerosis. No enlarged abdominal or pelvic lymph nodes. Reproductive: Status post hysterectomy. No adnexal masses. Other: No abdominal wall hernia or abnormality. No abdominopelvic ascites. Musculoskeletal: No acute or significant osseous findings. IMPRESSION: 1. No acute or active process within the abdomen or pelvis. 2. Evidence of prior cholecystectomy and hysterectomy. 3. Aortic atherosclerosis. Electronically Signed   By: Suzen Dials M.D.   On: 08/02/2024 20:01   CT Head Wo Contrast Result Date: 08/02/2024 CLINICAL DATA:  Mental status change, unknown cause. Generalized weakness. EXAM: CT HEAD WITHOUT CONTRAST TECHNIQUE: Contiguous axial images were obtained from the base of the skull through the vertex without intravenous contrast. RADIATION DOSE REDUCTION: This exam was  performed according to the departmental dose-optimization program which includes automated exposure control, adjustment of the mA and/or kV according to patient size and/or use of iterative reconstruction technique. COMPARISON:  Head CT 03/10/2024 FINDINGS: Brain: There is no evidence of an acute infarct, intracranial hemorrhage, mass, midline shift, or extra-axial fluid collection. Cerebral volume is normal. The ventricles are normal in size. Vascular: No hyperdense vessel. Skull: No fracture or suspicious lesion. Sinuses/Orbits: Minimal mucosal thickening in the included paranasal sinuses. Clear mastoid air cells. Unremarkable orbits. Other: None. IMPRESSION: Unremarkable CT appearance of the brain. Electronically Signed   By: Dasie Hamburg M.D.   On: 08/02/2024 19:50    Labs: Basic Metabolic Panel: Recent Labs  Lab 08/14/24 0542 08/15/24 0502 08/19/24 0957 08/20/24 0419  NA 139 136 140 144  K 3.8 3.6 3.6 3.4*  CL 102 101 100 104  CO2 28 29 25 27   GLUCOSE 259* 203* 139* 90  BUN 14 13 16 12   CREATININE 0.98 0.86 1.66* 1.08*  CALCIUM  9.3 9.0 9.8 9.4   CBC: Recent Labs  Lab 08/14/24 0542 08/15/24 0502 08/19/24 0957 08/20/24 0419  WBC 7.9 7.1  10.6* 7.6  HGB 10.7* 10.5* 11.4* 10.6*  HCT 33.0* 32.1* 35.2* 33.4*  MCV 92.4 91.7 92.1 92.8  PLT 215 215 216 194   Microbiology: Results for orders placed or performed during the hospital encounter of 08/10/24  Urine Culture     Status: Abnormal   Collection Time: 08/11/24  2:30 AM   Specimen: Urine, Catheterized  Result Value Ref Range Status   Specimen Description   Final    URINE, CATHETERIZED Performed at [facility], [address] [place], [place] [address]    Special Requests   Final    NONE Performed at [facility], [address], [place], [place] [address]    Culture (A)  Final    <10,000 COLONIES/mL INSIGNIFICANT GROWTH Performed at [facility], [address], [place], [place] [address]     Report Status 08/12/2024 FINAL  Final    Time coordinating discharge: Over 30 minutes  Marien LITTIE Piety, MD  [facility] 08/20/2024, 3:42 PM

## 2024-08-20 NOTE — TOC CM/SW Note (Signed)
 Patient is not able to walk the distance required to go the bathroom, or he/she is unable to safely negotiate stairs required to access the bathroom.  A 3in1 BSC will alleviate this problem

## 2024-08-20 NOTE — TOC Transition Note (Signed)
 Transition of Care [facility]) - Discharge Note   Patient Details  Name: Alicia Mayo MRN: [id number] Date of Birth: [birth year]  [facility]) CM/SW Contact:  Marinda Cooks, RN Phone Number: 08/20/2024, 4:16 PM   Clinical Narrative:    This CM updated by covering MD pt medically cleared to dc today and has active DC order . This CM spoke with [facility] and coordinated HH .DC transportation confirmed for pt with care giver Mr. Rosal[facility] Medical team updated . DME arranged with [employer] and scheduled tob e delivered to pt's home per her request.No additional DC needs requested by medical team or identified by CM at this time .    Final next level of care: Home w Home Health Services Barriers to Discharge: No Barriers Identified   Patient Goals and CMS Choice     Choice offered to / list presented to : Patient      Discharge Placement       Back Home      Name of family member notified: Patient Patient and family notified of of transfer: 08/20/24  Discharge Plan and Services Additional resources added to the After Visit Summary for                  DME Arranged: 3-N-1 DME Agency: [employer] Date DME Agency Contacted: 08/20/24 Time DME Agency Contacted: 1310     HH Agency: [facility] Date [facility] Agency Contacted: 08/20/24 Time HH Agency Contacted: 1610 Representative spoke with at [facility] Agency: Darleene  Social Drivers of Health (SDOH) Interventions SDOH Screenings   Food Insecurity: No Food Insecurity (08/20/2024)  Housing: Low Risk  (08/20/2024)  Transportation Needs: No Transportation Needs (08/20/2024)  Utilities: Not At Risk (08/20/2024)  Financial Resource Strain: Low Risk  (08/31/2023)   Received from [facility]  Physical Activity: Inactive (11/03/2022)   Received from [facility]  Social Connections: Socially Isolated (11/03/2022)   Received from [facility]  Stress: No Stress  Concern Present (11/03/2022)   Received from [facility]  Tobacco Use: High Risk (08/19/2024)     Readmission Risk Interventions     No data to display

## 2024-08-20 NOTE — Progress Notes (Incomplete)
 PROGRESS NOTE Alicia Mayo    DOB: 07-02-1966, 58 y.o.  FMW:[id number]    Code Status: Full Code   DOA: 08/19/2024   LOS: 0  Brief hospital course  Alicia Mayo is a 58 y.o. female with a PMH significant for ***  They presented from *** to the ED on 08/19/2024 with *** x *** days. ***  In the ED, it was found that they had ***.  Significant findings included ***.  They were initially treated with ***.   Patient was admitted to medicine service for further workup and management of *** as outlined in detail below.  08/20/24 -***  Assessment & Plan  Principal Problem:   AMS (altered mental status) Active Problems:   Uncontrolled type 2 diabetes mellitus with hyperglycemia, with long-term current use of insulin  (HCC)   AKI (acute kidney injury)   History of stroke   Fibromyalgia   Bipolar II disorder (HCC)   Depression with anxiety  Acute change in mental status - She will be placed in observation - This could be multifactorial, has mild AKI and likely polypharmacy causing the problem. - Workup so far is negative including source of infection and brain imaging. - Ammonia level is 42 suggesting that this may not be the case for this AMS - Supportive care - She will be n.p.o. and IV fluids until she is able to respond - I will not give her any antibiotics at this point due to multiple treatments for UTI in the past. - Monitor in the hospital overnight   2.  AKI - This may have contributed some for AMS - Will give her gentle hydration - Will avoid nephrotoxic drugs - Will monitor kidney function   3.  Diabetes - It appears that she takes a high dose of insulin  - Will place her on sliding scale while she is n.p.o. - Will resume her home dose of medication once she is able to eat and drink   4.  Hypertension/hyperlipidemia - Will hold off all the blood pressure medication except metoprolol - Resume Crestor    5.  Psychotropic medications - Will keep Subutex . - I will  hold off Cogentin , buspirone Lexapro  and Invega  and Lyrica  until she is able to wake up - Then we have to decrease as many medication as possible   6.  Chronic constipation - She is supposed to be on MiraLAX GlycoLax packet as well as lactulose and laziness - I will continue only lactulose at this point - We can add other medications when she is able to wake up and respond  Body mass index is 33.49 kg/m.  VTE ppx: SCDs Start: 08/19/24 1547   Diet:     Diet   Diet NPO time specified   Consultants: ***  Subjective 08/20/24    Pt reports ***   Objective  Blood pressure 114/60, pulse 68, temperature 98.8 F (37.1 C), resp. rate 15, height 5' 7 (1.702 m), weight 97 kg, SpO2 97%.  Intake/Output Summary (Last 24 hours) at 08/20/2024 0817 Last data filed at 08/20/2024 0600 Gross per 24 hour  Intake 995.78 ml  Output --  Net 995.78 ml   Filed Weights   08/19/24 1040  Weight: 97 kg     Physical Exam: *** General: awake, alert, NAD HEENT: atraumatic, clear conjunctiva, anicteric sclera, MMM, hearing grossly normal Respiratory: normal respiratory effort. Cardiovascular: extremities well perfused, quick capillary refill, normal S1/S2, RRR, no JVD, murmurs Gastrointestinal: soft, NT, ND Nervous: A&O x3. no gross  focal neurologic deficits, normal speech Extremities: moves all equally, no edema, normal tone Skin: dry, intact, normal temperature, normal color. No rashes, lesions or ulcers on exposed skin Psychiatry: normal mood, congruent affect  Labs   I have personally reviewed the following labs and imaging studies CBC    Component Value Date/Time   WBC 7.6 08/20/2024 0419   RBC 3.60 (L) 08/20/2024 0419   HGB 10.6 (L) 08/20/2024 0419   HCT 33.4 (L) 08/20/2024 0419   PLT 194 08/20/2024 0419   MCV 92.8 08/20/2024 0419   MCH 29.4 08/20/2024 0419   MCHC 31.7 08/20/2024 0419   RDW 12.7 08/20/2024 0419   LYMPHSABS 4.4 (H) 08/10/2024 2018   MONOABS 0.5 08/10/2024 2018    EOSABS 0.2 08/10/2024 2018   BASOSABS 0.0 08/10/2024 2018      Latest Ref Rng & Units 08/20/2024    4:19 AM 08/19/2024    9:57 AM 08/15/2024    5:02 AM  BMP  Glucose 70 - 99 mg/dL 90  860  796   BUN 6 - 20 mg/dL 12  16  13    Creatinine 0.44 - 1.00 mg/dL 8.91  8.33  9.13   Sodium 135 - 145 mmol/L 144  140  136   Potassium 3.5 - 5.1 mmol/L 3.4  3.6  3.6   Chloride 98 - 111 mmol/L 104  100  101   CO2 22 - 32 mmol/L 27  25  29    Calcium  8.9 - 10.3 mg/dL 9.4  9.8  9.0     MR BRAIN WO CONTRAST Result Date: 08/19/2024 EXAM: MR Brain without Intravenous Contrast. CLINICAL HISTORY: Mental status change, unknown cause. Pt arrives via EMS from home. Pt was reportedly recently admitted and discharged for a UTI. Patient's spouse reported to EMS that she has had ongoing confusion and lethargy since being released. Pt arrives alert to voice and has some confusion noted. TECHNIQUE: Magnetic resonance images of the brain without intravenous contrast in multiple planes. CONTRAST: Without. COMPARISON: CT head without contrast 08/19/2024. FINDINGS: BRAIN: No restricted diffusion to indicate acute infarction. No intracranial mass or hemorrhage. No midline shift or extra-axial fluid collection. No cerebellar tonsillar ectopia. The central arterial and venous flow voids are patent. Periventricular and scattered subcortical T2 hyperintensities bilaterally advanced for age. This most likely reflects the sequelae of chronic microvascular ischemia. VENTRICLES: No hydrocephalus. ORBITS: The orbits are normal. SINUSES AND MASTOIDS: The sinuses and mastoid air cells are clear. BONES: No acute fracture or focal osseous lesion. IMPRESSION: 1. No acute findings. 2. Periventricular and scattered subcortical T2 hyperintensities bilaterally advanced for age, most likely reflecting sequelae of chronic microvascular ischemia. Electronically signed by: Lonni Necessary MD 08/19/2024 02:51 PM EDT RP Workstation: [id number]   CT  Head Wo Contrast Result Date: 08/19/2024 EXAM: CT HEAD WITHOUT CONTRAST 08/19/2024 10:55:39 AM TECHNIQUE: CT of the head was performed without the administration of intravenous contrast. Automated exposure control, iterative reconstruction, and/or weight based adjustment of the mA/kV was utilized to reduce the radiation dose to as low as reasonably achievable. COMPARISON: CT head without contrast 08/10/2024. CLINICAL HISTORY: Mental status change, unknown cause. Pt was reportedly recently admitted and discharged for a UTI. Patient's spouse reported to EMS that she has had ongoing confusion and lethargy since being released. Pt arrives alert to voice and has some confusion noted. FINDINGS: BRAIN AND VENTRICLES: No acute hemorrhage. No evidence of acute infarct. No hydrocephalus. No extra-axial collection. No mass effect or midline shift. ORBITS: No acute abnormality. SINUSES:  No acute abnormality. SOFT TISSUES AND SKULL: No acute soft tissue abnormality. No skull fracture. IMPRESSION: 1. No acute intracranial abnormality. Electronically signed by: Lonni Necessary MD 08/19/2024 10:59 AM EDT RP Workstation: [id number]   DG Chest Portable 1 View Result Date: 08/19/2024 CLINICAL DATA:  Weakness EXAM: PORTABLE CHEST 1 VIEW COMPARISON:  Chest radiograph dated 10/13/2021 FINDINGS: Low lung volumes with bronchovascular crowding. Bibasilar patchy opacities. No pleural effusion or pneumothorax. The heart size and mediastinal contours are within normal limits. No acute osseous abnormality. IMPRESSION: Low lung volumes with bronchovascular crowding. Bibasilar patchy opacities, likely atelectasis. Aspiration or pneumonia can be considered in the appropriate clinical setting. Electronically Signed   By: Limin  Xu M.D.   On: 08/19/2024 10:32    Disposition Plan & Communication  Patient status: Observation  Admitted From: {From:23814} Planned disposition location: {PLAN; DISPOSITION:26386} Anticipated discharge  date: *** pending ***  Family Communication: ***    Author: Marien LITTIE Piety, DO [facility] 08/20/2024, 8:17 AM   Available by Epic secure chat 7AM-7PM. If 7PM-7AM, please contact night-coverage.  [facility] contact information found on [email or web address].

## 2024-08-20 NOTE — Plan of Care (Signed)

## 2024-09-28 NOTE — Progress Notes (Signed)
 " Subjective:   Alicia Mayo is a 58 y.o. female patient here for Hospital Follow Up (Letter for re-certification for apartment )   HPI History of Present Illness Alicia Mayo is a 58 year old female who presents for assistance with a housing letter and evaluation of gastrointestinal symptoms.  She requires assistance with a housing letter, similar to one provided last year, to include updated information about her current situation. She has been out of the hospital and receives help from two home health aides, necessitating use of a 2 bedroom place of living.   She experiences ongoing issues with loose stools, occurring daily after medication intake. The stools sometimes contain undigested food such as sweet peas, [place] beans, and cornbread, and are liquidy and sometimes explosive. She does not have diarrhea every day and notes that some days her stools are more formed. Occasional cramping is present but not bothersome. No fever, chills, or recent blood in the stool. She recalls a past episode of blood in the stool requiring hospitalization and treatment, which occurred nearly a year ago. She is currently taking ondansetron . She denies significant/persistent diarrhea; these are more baseline sx related to diet.   She mentions needing a referral to a podiatrist for her right big toe, which has been treated by a friend at home.    Review of Systems ROS A problem pertinent ROS was performed and positive findings are noted above. All other systems reviewed were negative.   Outpatient Medications Prior to Visit  Medication Sig Dispense Refill   albuterol  MDI, PROVENTIL , VENTOLIN , PROAIR , HFA 90 mcg/actuation inhaler Inhale 2 inhalations into the lungs every 4 (four) hours as needed for Wheezing 8.5 g 10   alcohol swabs (ALCOHOL PREP PADS) PadM Apply 1 each topically 3 (three) times daily 270 each 3   ammonium lactate (AMLACTIN) 12 % cream APPLY TOPICALLY TWICE A DAY 280 g 6   buprenorphine   HCL (SUBUTEX ) 2 mg SL tablet Place 1 tablet (2 mg total) under the tongue 2 (two) times daily 60 tablet 2   busPIRone  (BUSPAR ) 15 MG tablet Take 1 tablet (15 mg total) by mouth 3 (three) times daily 270 tablet 3   calcipotriene (DOVONEX) 0.005 % cream Apply topically 2 (two) times daily 120 g 10   cyclobenzaprine (FLEXERIL) 10 MG tablet TAKE 1 TABLET BY MOUTH TWICE DAILY AS NEEDED MUSCLE SPASMS 60 tablet 0   desonide (DESOWEN) 0.05 % ointment Apply topically 3 (three) times daily 60 g 11   diaper,brief,adult,disposable (BRIEFS, ADULT-EXTRA LARGE) Misc As needed 120 each 11   diclofenac (VOLTAREN) 1 % topical gel Apply topically 4 (four) times daily (Patient taking differently: Apply topically 4 (four) times daily as needed) 350 g 3   empagliflozin (JARDIANCE) 10 mg tablet Take 1 tablet (10 mg total) by mouth once daily 90 tablet 3   escitalopram  oxalate (LEXAPRO ) 20 MG tablet Take 1 tablet (20 mg total) by mouth once daily 90 tablet 3   estradioL (ESTRACE) 0.01 % (0.1 mg/gram) vaginal cream Place 0.5 g vaginally twice a week 42.5 g 0   fexofenadine (ALLEGRA) 180 MG tablet Take 1 tablet (180 mg total) by mouth once daily 90 tablet 3   flash glucose sensor (FREESTYLE LIBRE 2 SENSOR) Kit Use 1 kit every 14 (fourteen) days (Patient not taking: Reported on 08/25/2024) 6 each 3   fluticasone  propion-salmeteroL (ADVAIR DISKUS) 250-50 mcg/dose diskus inhaler INHALE ONE (1) PUFF BY MOUTH EVERY 12 HOURS 60 each 10   fluticasone  propionate (FLONASE)  50 mcg/actuation nasal spray SPRAY 2 SPRAYS INTO EACH NOSTRIL EVERY DAY 16 g 3   food supplemt, lactose-reduced (ENSURE ACTIVE PROTEIN-MUSCLE) Liqd DRINK 1 BOTTLE BY MOUTH TWICE DAILY (Patient taking differently: Take 1 Bottle by mouth 4 (four) times daily 4 shakes per day) 4266 mL 10   FREESTYLE LIBRE 2 READER reader Use 1 Device as directed (Patient not taking: Reported on 08/25/2024) 1 each 3   FUROsemide  (LASIX ) 20 MG tablet Take 1 tablet (20 mg  total) by mouth once daily 90 tablet 3   glucose chew tablet 4 gram chewable tablet Take 4 tablets by mouth as needed for Low blood sugar     incontinence pad, liner, disp Pads Liners for her bed due to incontinence. 30 each 3   insulin  ASPART (NOVOLOG  FLEXPEN) pen injector (concentration 100 units/mL) Give dose per pre-meal sliding scale, max 100 units per day. Blood sugar 150-200: Novolog  18 units Blood sugar 201-250: Novolog  20 units Blood sugar 251-300: Novolog  22 units Blood sugar 301-350: Novolog  24 units Blood sugar 351-400: Novolog  26 units Blood sugar over 400: Novolog  28 units 30 mL 11   insulin  GLARGINE (LANTUS  SOLOSTAR U-100 INSULIN ) pen injector (concentration 100 units/mL) Inject 52 Units subcutaneously 2 (two) times daily for 360 days (Patient taking differently: Inject 52 Units subcutaneously 36 Units BID) 31.2 mL 11   lancets Use 1 each 3 (three) times daily Use as instructed. 100 each 12   lancets Use 1 each 3 (three) times daily Use as instructed. 270 each 3   lidocaine  (LIDODERM ) 5 % patch Place 1 patch onto the skin once daily TO THE MOST PAINFUL AREA FOR UP TO 12 HOURS IN A 24 HOUR PERIOD 30 patch 10   linaCLOtide  (LINZESS ) 290 mcg capsule Take 1 capsule (290 mcg total) by mouth once daily 90 capsule 3   losartan  (COZAAR ) 100 MG tablet Take 1 tablet (100 mg total) by mouth once daily 90 tablet 3   lurasidone  (LATUDA ) 60 mg tablet Take 1 tablet (60 mg total) by mouth once daily 90 tablet 3   melatonin 5 mg TbDL Take 10 mg by mouth at bedtime 90 tablet 0   metFORMIN (GLUCOPHAGE-XR) 500 MG XR tablet Take 2 tablets (1,000 mg total) by mouth 2 (two) times daily 360 tablet 3   metoprolol  TARTrate (LOPRESSOR ) 50 MG tablet Take 1 tablet (50 mg total) by mouth 2 (two) times daily 180 tablet 3   NARCAN  4 mg/actuation nasal spray USE 1 SPRAY IN 1 NOSTRIL ONCE AS NEEDED FOR ACCIDENTAL OVERDOSE MAY REPEAT IN 2-3 MINS IN OTHER NOSTRIL UNTIL PATIENT IS RESPONSIVE OR EMS ARRIVES 1  each 10   pantoprazole  (PROTONIX ) 40 MG DR tablet Take 1 tablet (40 mg total) by mouth every morning 90 tablet 3   pen needle, diabetic 32 gauge x 5/32 Ndle Use 1 each 4 (four) times daily 360 each 3   polyethylene glycol (MIRALAX ) packet Take 1 packet (17 g total) by mouth once daily Mix in 4-8ounces of fluid prior to taking. 90 packet 3   pregabalin  (LYRICA ) 150 MG capsule Take 1 capsule (150 mg total) by mouth 3 (three) times daily 270 capsule 3   rosuvastatin  (CRESTOR ) 5 MG tablet Take 1 tablet (5 mg total) by mouth once daily 90 tablet 3   sodium chloride  (OCEAN) 0.65 % nasal spray Place 1 spray into both nostrils as needed for Congestion 100 mL 1   trospium (SANCTURA) 20 mg tablet Take 1 tablet (20 mg total)  by mouth 2 (two) times daily 180 tablet 3   underpads 23 X 36  Pads Use 1 Bag as directed 72 each 3   ondansetron  (ZOFRAN ) 4 MG tablet Take 1 tablet (4 mg total) by mouth every 8 (eight) hours as needed 20 tablet 5   No facility-administered medications prior to visit.    Patient Active Problem List  Diagnosis   Asthma (HHS-HCC)   Chronic low back pain   Chronic abdominal pain, unspecified   Insomnia   Fibromyalgia   Osteoarthritis   Esophageal reflux   Hot flashes   PTSD (post-traumatic stress disorder), unspecified   Bipolar II disorder (CMS-HCC)   Chronic pain syndrome   Urinary incontinence   Polypharmacy   Leg cramps   Health care home, active care coordination   Nausea and vomiting   NASH (nonalcoholic steatohepatitis)   Hypokalemia   Essential hypertension   Onychomycosis   Lesion of vulva   Cognitive deficit S/P CVA (cerebrovascular accident)   Psychogenic nonepileptic seizure   Tachypnea   Moderate episode of recurrent major depressive disorder (CMS-HCC)   Vulvodynia   Trigger finger of left thumb   Trigger index finger of left hand   Gastroparesis   Type 2 diabetes mellitus with hyperglycemia, with long-term  current use of insulin  (CMS/HHS-HCC)   Obesity (BMI 30.0-34.9)   Anxiety   Onychocryptosis   Hallux valgus with bunions of left foot   Hallux valgus with bunions of right foot   COPD (chronic obstructive pulmonary disease) (CMS/HHS-HCC)   Urinary retention   Constipation   Acute metabolic encephalopathy   Opiate dependence (CMS/HHS-HCC)   Dyslipidemia   Anxiety and depression   Other emphysema (CMS/HHS-HCC)   Severe tobacco use disorder   Type 2 diabetes mellitus with diabetic neuropathy (CMS/HHS-HCC)   Cutaneous abscess of chest wall   Sleep apnea   Family History  Problem Relation Age of Onset   Thyroid disease Mother    High blood pressure (Hypertension) Mother    Glaucoma Mother    Cervical cancer Mother 6   Uterine cancer Mother    Ovarian cancer Mother    Bipolar disorder Mother    Diabetes Mother    High blood pressure (Hypertension) Father    Bone cancer Father 73   Thyroid disease Sister    High blood pressure (Hypertension) Sister    Glaucoma Sister    Heart disease Sister    Myocardial Infarction (Heart attack) Sister 10       pacemaker   Diabetes Sister    High blood pressure (Hypertension) Sister    Glaucoma Sister    Stroke Sister    Aneurysm Sister    Myocardial Infarction (Heart attack) Sister 45   Diabetes Sister    High blood pressure (Hypertension) Brother    Glaucoma Brother    High blood pressure (Hypertension) Brother    Glaucoma Brother    High blood pressure (Hypertension) Maternal Grandmother    Glaucoma Maternal Grandmother    Breast cancer Maternal Grandmother    Cervical cancer Maternal Grandmother    Uterine cancer Maternal Grandmother    Ovarian cancer Maternal Grandmother    High blood pressure (Hypertension) Maternal Grandfather    High blood pressure (Hypertension) Paternal Grandmother    High blood pressure (Hypertension) Paternal Grandfather    Schizophrenia Child     High blood pressure (Hypertension) Maternal Aunt    Glaucoma Maternal Aunt    Breast cancer Maternal Aunt    Cervical cancer Maternal  Aunt    Uterine cancer Maternal Aunt    Ovarian cancer Maternal Aunt    Diabetes Maternal Aunt    High blood pressure (Hypertension) Maternal Aunt    Glaucoma Maternal Aunt    Bipolar disorder Maternal Aunt    Diabetes Maternal Aunt    High blood pressure (Hypertension) Maternal Uncle    High blood pressure (Hypertension) Paternal Aunt    Glaucoma Paternal Aunt    High blood pressure (Hypertension) Paternal Uncle    Breast cancer Cousin    Pancreatic cancer Cousin    Liver disease Neg Hx    Macular degeneration Neg Hx    Melanoma Neg Hx    Anesthesia problems Neg Hx    Social History   Social History Narrative   Single. Divorced. 8 children- 6 living-4 daughters, 2 sons.  She did not raise all of them.  States the sons grew up in [place].  One of her daughters, Alicia Mayo, has been causing a lot of stress lately going to her house in the middle of the night, wanting her to keep the two young grandchildren.  Same daughter broke into her house  Recently (11/2018) looking for medications.     Used to work in audiological scientist, nursing, freight forwarder and house cleaning.   No blood transfusions. One tattoo. Ear piercings and tongue piercings.    Social History   Tobacco Use   Smoking status: Every Day    Current packs/day: 1.50    Average packs/day: 1.5 packs/day for 38.0 years (57.0 ttl pk-yrs)    Types: Cigarettes    Passive exposure: Never   Smokeless tobacco: Never   Tobacco comments:    Currently smoking 1.5 packs per day - declines smoking cessation consult - has tried multiple medications that she cannot tolerate   Vaping Use   Vaping status: Former  Substance Use Topics   Alcohol use: Not Currently   Drug use: No     Objective:   Vitals:   09/28/24 0919  BP: 117/66  Pulse: 78  Temp: 36.6 C (97.8 F)  TempSrc:  Oral  Weight: 99.1 kg (218 lb 7.6 oz)  PainSc:   8  PainLoc: Generalized   Body mass index is 34.22 kg/m. Home Vitals    Physical Exam Vital signs: reviewed (see above) Gen:  AAO, NAD, well-appearing, well-nourished/developed, pleasant HEENT:  MMM EOMI, sclerae clear Abd: Soft, mild TTP that is consistent with the level of her usual baseline in the upper abd; no lower abd TTP. Neuro: motor and sensory function grossly intact; normal gait Ext: WWP; no swelling or pitting edema; R 1st toenail ingrowing with some denuded tissue along the nail edges.    Assessment and Plan:   Assessment & Plan Loose stools and intermittent diarrhea Intermittent loose stools and diarrhea, not consistent with infectious etiology. Stool study showed Salmonella and parechovirus, but no clinical symptoms suggestive of infection at this time (they may have been present at the time the study was ordered). Symptoms are likely related to medication and diet. No recent blood in stool, which was previously associated with hospital admission. - Continue to monitor symptoms and avoid treatment unless severe diarrhea or blood in stool develops. Culture results can guide treatment if that is needed  Nausea - Refilled ondansetron  prescription at [facility]. Use sparingly.   Ingrown toenail Right big toe condition requiring podiatry evaluation. - Provided referral to podiatrist for right big toe evaluation.  Visit Diagnoses and Orders Diagnoses and all orders for  this visit:  Housing or economic circumstance  Need for vaccination -     PFF77 - Flu Vaccine IIV3, IM PF (5MO+)(Fluarix, Flulaval, Fluzone)  Nausea -     ondansetron  (ZOFRAN ) 4 MG tablet; Take 1 tablet (4 mg total) by mouth every 8 (eight) hours as needed  Ingrown toenail          Future Appointments     Date/Time Provider Department Center Visit Type   11/13/2024 1:00 PM (Arrive by 12:45 PM) Dante Alicia Celestia CLORE, MD [facility] OFFICE VISIT   11/22/2024 4:20 PM (Arrive by 3:50 PM) Jon Franky Loving, MD [facility] RETURN VISIT   02/02/2025 9:15 AM Devetski, Debby RAMAN, OD [facility] OPH COS RETURN       This visit was coded based on medical decision making (MDM).     This note has been created using automated tools and reviewed for accuracy by Alicia CELESTIA BYNUM IV. "

## 2024-10-27 ENCOUNTER — Emergency Department

## 2024-10-27 ENCOUNTER — Emergency Department: Admission: EM | Admit: 2024-10-27 | Discharge: 2024-10-28 | Disposition: A

## 2024-10-27 DIAGNOSIS — J4489 Other specified chronic obstructive pulmonary disease: Secondary | ICD-10-CM | POA: Diagnosis not present

## 2024-10-27 DIAGNOSIS — E119 Type 2 diabetes mellitus without complications: Secondary | ICD-10-CM | POA: Insufficient documentation

## 2024-10-27 DIAGNOSIS — I10 Essential (primary) hypertension: Secondary | ICD-10-CM | POA: Insufficient documentation

## 2024-10-27 DIAGNOSIS — T438X5A Adverse effect of other psychotropic drugs, initial encounter: Secondary | ICD-10-CM | POA: Diagnosis not present

## 2024-10-27 DIAGNOSIS — T887XXA Unspecified adverse effect of drug or medicament, initial encounter: Secondary | ICD-10-CM | POA: Insufficient documentation

## 2024-10-27 DIAGNOSIS — F3164 Bipolar disorder, current episode mixed, severe, with psychotic features: Secondary | ICD-10-CM

## 2024-10-27 DIAGNOSIS — R8281 Pyuria: Secondary | ICD-10-CM | POA: Diagnosis not present

## 2024-10-27 DIAGNOSIS — R443 Hallucinations, unspecified: Secondary | ICD-10-CM

## 2024-10-27 DIAGNOSIS — F319 Bipolar disorder, unspecified: Secondary | ICD-10-CM | POA: Diagnosis not present

## 2024-10-27 DIAGNOSIS — D72829 Elevated white blood cell count, unspecified: Secondary | ICD-10-CM | POA: Diagnosis not present

## 2024-10-27 DIAGNOSIS — F29 Unspecified psychosis not due to a substance or known physiological condition: Secondary | ICD-10-CM | POA: Insufficient documentation

## 2024-10-27 DIAGNOSIS — R4182 Altered mental status, unspecified: Secondary | ICD-10-CM

## 2024-10-27 LAB — URINALYSIS, ROUTINE W REFLEX MICROSCOPIC
Bacteria, UA: NONE SEEN
Bilirubin Urine: NEGATIVE
Glucose, UA: NEGATIVE mg/dL
Hgb urine dipstick: NEGATIVE
Ketones, ur: NEGATIVE mg/dL
Nitrite: NEGATIVE
Protein, ur: NEGATIVE mg/dL
Specific Gravity, Urine: 1.013 (ref 1.005–1.030)
pH: 5 (ref 5.0–8.0)

## 2024-10-27 LAB — COMPREHENSIVE METABOLIC PANEL WITH GFR
ALT: 23 U/L (ref 0–44)
AST: 93 U/L — ABNORMAL HIGH (ref 15–41)
Albumin: 4 g/dL (ref 3.5–5.0)
Alkaline Phosphatase: 69 U/L (ref 38–126)
Anion gap: 10 (ref 5–15)
BUN: 14 mg/dL (ref 6–20)
CO2: 29 mmol/L (ref 22–32)
Calcium: 9.4 mg/dL (ref 8.9–10.3)
Chloride: 104 mmol/L (ref 98–111)
Creatinine, Ser: 1.15 mg/dL — ABNORMAL HIGH (ref 0.44–1.00)
GFR, Estimated: 55 mL/min — ABNORMAL LOW
Glucose, Bld: 161 mg/dL — ABNORMAL HIGH (ref 70–99)
Potassium: 4 mmol/L (ref 3.5–5.1)
Sodium: 143 mmol/L (ref 135–145)
Total Bilirubin: 0.6 mg/dL (ref 0.0–1.2)
Total Protein: 7.5 g/dL (ref 6.5–8.1)

## 2024-10-27 LAB — CBC
HCT: 33 % — ABNORMAL LOW (ref 36.0–46.0)
Hemoglobin: 10.9 g/dL — ABNORMAL LOW (ref 12.0–15.0)
MCH: 29 pg (ref 26.0–34.0)
MCHC: 33 g/dL (ref 30.0–36.0)
MCV: 87.8 fL (ref 80.0–100.0)
Platelets: 199 K/uL (ref 150–400)
RBC: 3.76 MIL/uL — ABNORMAL LOW (ref 3.87–5.11)
RDW: 13.2 % (ref 11.5–15.5)
WBC: 10.6 K/uL — ABNORMAL HIGH (ref 4.0–10.5)
nRBC: 0 % (ref 0.0–0.2)

## 2024-10-27 LAB — CBG MONITORING, ED: Glucose-Capillary: 178 mg/dL — ABNORMAL HIGH (ref 70–99)

## 2024-10-27 NOTE — BH Assessment (Signed)
 This Clinical research associate contacted [facility] via phone to request an assessment, request has been made, assessment is currently pending

## 2024-10-27 NOTE — ED Provider Notes (Addendum)
 "  [facility] Provider Note    Event Date/Time   First MD Initiated Contact with Patient 10/27/24 0945     (approximate)   History   Altered Mental Status  Patient's caregiver reports patient has been having auditory and verbal hallucination X 2 days and is sleeping more than usual.    HPI Alicia Mayo is a 58 y.o. female PMH multiple prior stroke with right-sided weakness, bipolar disorder, diabetes, COPD/asthma, fibromyalgia, hypertension, MDD, PTSD, prior UTI for evaluation of altered mental status - Patient is not able to tell me why she is here or where she is.  Denies any pain.  Says I can call her caregiver [name] for more information. - attempted to gather collateral from pt's emergency contact / caregiver Sydnee Mulch), no answer - Per triage note, patient has been having auditory and verbal hallucinations for the past 2 days and is sleeping more than usual  Per chart review, patient was recently admitted 08/19/2024-08/20/2024 for altered mental status,.  Says had multiple similar prior admissions as well.  Medical workup unremarkable, ultimately thought to be hypoactive delirium from medication side effects, apparently had been taking double the prescribed dose of her psychiatric medications.  Appears on multiple prior admissions they primarily focused on psychiatric medication reconciliation.      Physical Exam   Triage Vital Signs: ED Triage Vitals  Encounter Vitals Group     BP 10/27/24 0748 137/88     Girls Systolic BP Percentile --      Girls Diastolic BP Percentile --      Boys Systolic BP Percentile --      Boys Diastolic BP Percentile --      Pulse Rate 10/27/24 0746 (!) 105     Resp 10/27/24 0746 20     Temp 10/27/24 0746 98 F (36.7 C)     Temp Source 10/27/24 0746 Oral     SpO2 10/27/24 0746 94 %     Weight 10/27/24 0747 224 lb (101.6 kg)     Height 10/27/24 0747 5' 7 (1.702 m)     Head Circumference --      Peak Flow --       Pain Score --      Pain Loc --      Pain Education --      Exclude from Growth Chart --     Most recent vital signs: Vitals:   10/27/24 1027 10/27/24 1439  BP:  (!) 158/82  Pulse:  66  Resp:  18  Temp:  97.7 F (36.5 C)  SpO2: 97% 100%     General: Awake, no distress.  HEENT: Normocephalic, atraumatic, no meningismus, able to range neck without difficulty, no midline neck pain CV:  Good peripheral perfusion. RRR, RP 2+ Resp:  Normal effort. CTAB Abd:  No distention. Nontender to deep palpation throughout Neuro:  Alert, unclear orientation, face symmetric, moving all extremities spontaneously, no clear focal motor deficit appreciated   ED Results / Procedures / Treatments   Labs (all labs ordered are listed, but only abnormal results are displayed) Labs Reviewed  CBC - Abnormal; Notable for the following components:      Result Value   WBC 10.6 (*)    RBC 3.76 (*)    Hemoglobin 10.9 (*)    HCT 33.0 (*)    All other components within normal limits  URINALYSIS, ROUTINE W REFLEX MICROSCOPIC - Abnormal; Notable for the following components:   Color, Urine  YELLOW (*)    APPearance HAZY (*)    Leukocytes,Ua TRACE (*)    All other components within normal limits  COMPREHENSIVE METABOLIC PANEL WITH GFR - Abnormal; Notable for the following components:   Glucose, Bld 161 (*)    Creatinine, Ser 1.15 (*)    AST 93 (*)    GFR, Estimated 55 (*)    All other components within normal limits  CBG MONITORING, ED - Abnormal; Notable for the following components:   Glucose-Capillary 178 (*)    All other components within normal limits  URINE CULTURE     EKG  Ecg = sinus tachycardia, rate 103, no gross ST elevation or depression, isolated T wave inversions in lead III are nonspecific, borderline left axis deviation, normal intervals.  No clear evidence of ischemia nor arrhythmia my interpretation.   RADIOLOGY Radiology interpreted by myself and radiology reports  reviewed.  No acute pathology identified.    PROCEDURES:  Critical Care performed: No  Procedures   MEDICATIONS ORDERED IN ED: Medications - No data to display   IMPRESSION / MDM / ASSESSMENT AND PLAN / ED COURSE  I reviewed the triage vital signs and the nursing notes.                              DDX/MDM/AP: Differential diagnosis includes, but is not limited to, infectious encephalopathy (consider UTI, pneumonia, do not suspect intra-abdominal source nor meningitis at this time), consider for medication adherence, no clear evidence of stroke though consider possible intracranial hemorrhage.  Consider metabolic encephalopathy.  Plan: - Labs - CT head - Chest x-ray - Reassess  Patient's presentation is most consistent with acute presentation with potential threat to life or bodily function.  The patient is on the cardiac monitor to evaluate for evidence of arrhythmia and/or significant heart rate changes.  ED course below.  Laboratory workup overall unremarkable, at baseline.  Urinalysis not clearly consistent with UTI.  No clear evidence of medical pathology at this time and patient does have known history of bipolar disorder and is on multiple psychiatric medications.  Discussed with patient's caretaker who later came to emergency department and said that patient had been hallucinating more over the past week or so and a little slower to respond to questions than usual.  Will consult psychiatry for recommendations on possible medication management for patient, will defer admission at this time given no clear medical pathology on my eval. Signed out to oncoming ED provider pending psychiatric evaluation.  Clinical Course as of 10/27/24 1651  Fri Oct 27, 2024  1020 CBC with very mild leukocytosis, stable anemia [MM]  1317 UA with mild pyuria, trace LE [MM]  1443 CTH: IMPRESSION: 1. No acute intracranial abnormality.   [MM]  1444 CXR: IMPRESSION: No active disease.    [MM]    Clinical Course User Index [MM] Clarine Ozell LABOR, MD     FINAL CLINICAL IMPRESSION(S) / ED DIAGNOSES   Final diagnoses:  Altered mental status, unspecified altered mental status type  Hallucinations     Rx / DC Orders   ED Discharge Orders     None        Note:  This document was prepared using Dragon voice recognition software and may include unintentional dictation errors.   Clarine Ozell LABOR, MD 10/27/24 1651    Clarine Ozell LABOR, MD 10/27/24 1652  "

## 2024-10-27 NOTE — BH Assessment (Addendum)
 Comprehensive Clinical Assessment (CCA) Screening, Triage and Referral Note  10/27/2024 Alicia Mayo [id number]  Chief Complaint:  Chief Complaint  Patient presents with   Altered Mental Status   Visit Diagnosis: Bipolar (Per chart history)  Alicia Mayo is 58 year old female who presents to the ER du to her caregivers having concern about her sleeping more and responding more to internal stimuli. The caregiver in the room Sydnee) states, he noticed when she receives her monthly injection to address her mental health, she becomes lethargic. This has happened for approximately three months. However, the most recent was administered approximately a week an ago and she has been sleeping more, and responding to internal stimuli. She' seeing children in her home, animals and having conversations with people that are not there. The caregiver shared he seen her like this in the past but it was due to a UTI. Per the other caregiver [phone]), she noticed the patient behaviors when she takes certain of her medications that are narcotics, but nothing in comparison to her current presentation. They have reduced some of the narcotics but the symptoms has increased.  Patient lives independently and the caregivers come to her home and aid her with her day to day responsibilities. Overall the patient is able to perform her ADL's without assistance. However, the female caregiver had to help dress the patient in order to bring her to the ER. He has known the patient for approximately thirty years.  The information for this consult was provided by the two caregivers, because the patient was unable to stay awake. She was only able to give her first name before going back to sleep. TTS made several attempts to engage the patient but was unsuccessful.   Patient Reported Information How did you hear about us ? Family/Friend  What Is the Reason for Your Visit/Call Today? Patient brought to the ER due to her  caregivers having concern about her sleeping more and responding more to internal stimuli.  How Long Has This Been Causing You Problems? 1 wk - 1 month  What Do You Feel Would Help You the Most Today? Treatment for Depression or other mood problem   Have You Recently Had Any Thoughts About Hurting Yourself? No  Are You Planning to Commit Suicide/Harm Yourself At This time? No   Have you Recently Had Thoughts About Hurting Someone Sherral? No  Are You Planning to Harm Someone at This Time? No  Explanation: No data recorded  Have You Used Any Alcohol or Drugs in the Past 24 Hours? No  How Long Ago Did You Use Drugs or Alcohol? No data recorded What Did You Use and How Much? No data recorded  Do You Currently Have a Therapist/Psychiatrist? Yes  Name of Therapist/Psychiatrist: No data recorded  Have You Been Recently Discharged From Any Office Practice or Programs? No  Explanation of Discharge From Practice/Program: No data recorded   CCA Screening Triage Referral Assessment Type of Contact: Face-to-Face  Telemedicine Service Delivery:   Is this Initial or Reassessment?   Date Telepsych consult ordered in [facility]:    Time Telepsych consult ordered in [facility]:    Location of Assessment: [facility] ED  Provider Location: [facility] ED    Collateral Involvement: No data recorded  Does Patient Have a Court Appointed Legal Guardian? No data recorded Name and Contact of Legal Guardian: No data recorded If Minor and Not Living with Parent(s), Who has Custody? No data recorded Is CPS involved or ever been involved? Never  Is  APS involved or ever been involved? Never   Patient Determined To Be At Risk for Harm To Self or Others Based on Review of Patient Reported Information or Presenting Complaint? No  Method: No data recorded Availability of Means: No data recorded Intent: No data recorded Notification Required: No data recorded Additional Information for Danger to Others Potential: No data  recorded Additional Comments for Danger to Others Potential: No data recorded Are There Guns or Other Weapons in Your Home? No data recorded Types of Guns/Weapons: No data recorded Are These Weapons Safely Secured?                            No data recorded Who Could Verify You Are Able To Have These Secured: No data recorded Do You Have any Outstanding Charges, Pending Court Dates, Parole/Probation? No data recorded Contacted To Inform of Risk of Harm To Self or Others: No data recorded  Does Patient Present under Involuntary Commitment? No   County of Residence: [place]   Patient Currently Receiving the Following Services: Medication Management   Determination of Need: Emergent (2 hours)   Options For Referral: ED Visit   Disposition Recommendation per psychiatric provider: Pending Psych Consult  Kiki DOROTHA Barge MS, LCAS, [facility], [facility] Therapeutic Triage Specialist 10/27/2024 5:46 PM

## 2024-10-27 NOTE — ED Notes (Signed)
 This RN and [employer] change pt out.  Pair of white socks Black and white shirt Pink purse Associate Professor with a clear care  [employer] house slippers  Kept at nursing station

## 2024-10-27 NOTE — ED Triage Notes (Signed)
 Patient's caregiver reports patient has been having auditory and verbal hallucination X 2 days and is sleeping more than usual.

## 2024-10-28 ENCOUNTER — Inpatient Hospital Stay (HOSPITAL_COMMUNITY)
Admission: AD | Admit: 2024-10-28 | Discharge: 2024-11-10 | DRG: 885 | Disposition: A | Discharge status: Home / Self Care | Source: Intra-hospital

## 2024-10-28 ENCOUNTER — Other Ambulatory Visit: Payer: Self-pay

## 2024-10-28 ENCOUNTER — Encounter (HOSPITAL_COMMUNITY): Payer: Self-pay | Admitting: Psychiatry

## 2024-10-28 DIAGNOSIS — F259 Schizoaffective disorder, unspecified: Principal | ICD-10-CM

## 2024-10-28 DIAGNOSIS — K589 Irritable bowel syndrome without diarrhea: Secondary | ICD-10-CM | POA: Diagnosis present

## 2024-10-28 DIAGNOSIS — K219 Gastro-esophageal reflux disease without esophagitis: Secondary | ICD-10-CM | POA: Diagnosis present

## 2024-10-28 DIAGNOSIS — Z7951 Long term (current) use of inhaled steroids: Secondary | ICD-10-CM

## 2024-10-28 DIAGNOSIS — I10 Essential (primary) hypertension: Secondary | ICD-10-CM | POA: Diagnosis present

## 2024-10-28 DIAGNOSIS — Z8673 Personal history of transient ischemic attack (TIA), and cerebral infarction without residual deficits: Secondary | ICD-10-CM | POA: Diagnosis not present

## 2024-10-28 DIAGNOSIS — F0280 Dementia in other diseases classified elsewhere without behavioral disturbance: Secondary | ICD-10-CM | POA: Diagnosis not present

## 2024-10-28 DIAGNOSIS — E1143 Type 2 diabetes mellitus with diabetic autonomic (poly)neuropathy: Secondary | ICD-10-CM | POA: Diagnosis present

## 2024-10-28 DIAGNOSIS — F17213 Nicotine dependence, cigarettes, with withdrawal: Secondary | ICD-10-CM | POA: Diagnosis present

## 2024-10-28 DIAGNOSIS — F03B2 Unspecified dementia, moderate, with psychotic disturbance: Principal | ICD-10-CM | POA: Diagnosis present

## 2024-10-28 DIAGNOSIS — E1142 Type 2 diabetes mellitus with diabetic polyneuropathy: Secondary | ICD-10-CM | POA: Diagnosis present

## 2024-10-28 DIAGNOSIS — F0392 Unspecified dementia, unspecified severity, with psychotic disturbance: Secondary | ICD-10-CM

## 2024-10-28 DIAGNOSIS — G20A1 Parkinson's disease without dyskinesia, without mention of fluctuations: Secondary | ICD-10-CM | POA: Diagnosis present

## 2024-10-28 DIAGNOSIS — G8929 Other chronic pain: Secondary | ICD-10-CM | POA: Diagnosis present

## 2024-10-28 DIAGNOSIS — F319 Bipolar disorder, unspecified: Secondary | ICD-10-CM | POA: Diagnosis not present

## 2024-10-28 DIAGNOSIS — F3164 Bipolar disorder, current episode mixed, severe, with psychotic features: Secondary | ICD-10-CM

## 2024-10-28 DIAGNOSIS — M25511 Pain in right shoulder: Secondary | ICD-10-CM | POA: Diagnosis present

## 2024-10-28 DIAGNOSIS — Z7984 Long term (current) use of oral hypoglycemic drugs: Secondary | ICD-10-CM | POA: Diagnosis not present

## 2024-10-28 DIAGNOSIS — M797 Fibromyalgia: Secondary | ICD-10-CM | POA: Diagnosis present

## 2024-10-28 DIAGNOSIS — Z79899 Other long term (current) drug therapy: Secondary | ICD-10-CM

## 2024-10-28 DIAGNOSIS — E785 Hyperlipidemia, unspecified: Secondary | ICD-10-CM | POA: Diagnosis present

## 2024-10-28 DIAGNOSIS — F489 Nonpsychotic mental disorder, unspecified: Secondary | ICD-10-CM | POA: Diagnosis present

## 2024-10-28 DIAGNOSIS — K3184 Gastroparesis: Secondary | ICD-10-CM | POA: Diagnosis present

## 2024-10-28 DIAGNOSIS — F251 Schizoaffective disorder, depressive type: Secondary | ICD-10-CM | POA: Diagnosis present

## 2024-10-28 DIAGNOSIS — I69354 Hemiplegia and hemiparesis following cerebral infarction affecting left non-dominant side: Secondary | ICD-10-CM | POA: Diagnosis not present

## 2024-10-28 DIAGNOSIS — G252 Other specified forms of tremor: Secondary | ICD-10-CM | POA: Diagnosis present

## 2024-10-28 DIAGNOSIS — J4489 Other specified chronic obstructive pulmonary disease: Secondary | ICD-10-CM | POA: Diagnosis present

## 2024-10-28 DIAGNOSIS — G3183 Dementia with Lewy bodies: Secondary | ICD-10-CM | POA: Diagnosis present

## 2024-10-28 MED ORDER — ALBUTEROL SULFATE HFA 108 (90 BASE) MCG/ACT IN AERS: 2.0000 | INHALATION_SPRAY | RESPIRATORY_TRACT | Status: DC | PRN

## 2024-10-28 MED ORDER — LACTULOSE 10 GM/15ML PO SOLN: 30.0000 g | Freq: Every day | ORAL | Status: DC | PRN

## 2024-10-28 MED ORDER — BUSPIRONE HCL 15 MG PO TABS
15.0000 mg | ORAL_TABLET | Freq: Three times a day (TID) | ORAL | Status: DC
  Administered 2024-10-28 – 2024-10-30 (×7): 15 mg via ORAL
  Filled 2024-10-28 (×7): qty 1

## 2024-10-28 MED ORDER — LURASIDONE HCL 60 MG PO TABS
2.0000 | ORAL_TABLET | Freq: Every day | ORAL | Status: DC
  Administered 2024-10-28 – 2024-10-29 (×2): 120 mg via ORAL
  Filled 2024-10-28 (×3): qty 2

## 2024-10-28 MED ORDER — HYDROXYZINE HCL 10 MG PO TABS: 10.0000 mg | ORAL_TABLET | ORAL | Status: DC | PRN

## 2024-10-28 MED ORDER — NICOTINE 14 MG/24HR TD PT24
14.0000 mg | MEDICATED_PATCH | Freq: Every day | TRANSDERMAL | Status: DC
  Administered 2024-10-28 – 2024-11-02 (×6): 14 mg via TRANSDERMAL
  Filled 2024-10-28 (×10): qty 1

## 2024-10-28 MED ORDER — LORATADINE 10 MG PO TABS
10.0000 mg | ORAL_TABLET | Freq: Every day | ORAL | Status: DC
  Administered 2024-10-28 – 2024-10-31 (×4): 10 mg via ORAL
  Filled 2024-10-28 (×4): qty 1

## 2024-10-28 MED ORDER — LOSARTAN POTASSIUM 50 MG PO TABS
100.0000 mg | ORAL_TABLET | Freq: Every day | ORAL | Status: DC
  Administered 2024-10-29 – 2024-11-10 (×13): 100 mg via ORAL
  Filled 2024-10-28 (×10): qty 2

## 2024-10-28 MED ORDER — BUPRENORPHINE HCL 2 MG SL SUBL: 2.0000 mg | SUBLINGUAL_TABLET | Freq: Two times a day (BID) | SUBLINGUAL | Status: DC

## 2024-10-28 MED ORDER — ESCITALOPRAM OXALATE 10 MG PO TABS
20.0000 mg | ORAL_TABLET | Freq: Every day | ORAL | Status: DC
  Administered 2024-10-28 – 2024-10-31 (×4): 20 mg via ORAL
  Filled 2024-10-28 (×4): qty 2

## 2024-10-28 MED ORDER — FLUTICASONE PROPIONATE 50 MCG/ACT NA SUSP: 2.0000 | Freq: Every day | NASAL | Status: DC

## 2024-10-28 MED ORDER — ROSUVASTATIN CALCIUM 5 MG PO TABS
5.0000 mg | ORAL_TABLET | Freq: Every day | ORAL | Status: DC
  Administered 2024-10-28 – 2024-10-30 (×3): 5 mg via ORAL
  Filled 2024-10-28 (×3): qty 1

## 2024-10-28 MED ORDER — METOPROLOL TARTRATE 50 MG PO TABS
50.0000 mg | ORAL_TABLET | Freq: Two times a day (BID) | ORAL | Status: DC
  Administered 2024-10-28 – 2024-11-10 (×27): 50 mg via ORAL
  Filled 2024-10-28 (×2): qty 1
  Filled 2024-10-28: qty 2
  Filled 2024-10-28: qty 1
  Filled 2024-10-28: qty 2
  Filled 2024-10-28 (×16): qty 1

## 2024-10-28 MED ORDER — FLUTICASONE FUROATE-VILANTEROL 200-25 MCG/ACT IN AEPB
1.0000 | INHALATION_SPRAY | Freq: Every day | RESPIRATORY_TRACT | Status: DC
  Administered 2024-10-28 – 2024-11-10 (×14): 1 via RESPIRATORY_TRACT
  Filled 2024-10-28: qty 28

## 2024-10-28 MED ORDER — LINACLOTIDE 145 MCG PO CAPS
290.0000 ug | ORAL_CAPSULE | Freq: Every day | ORAL | Status: DC
  Administered 2024-10-28 – 2024-10-31 (×4): 290 ug via ORAL
  Filled 2024-10-28 (×4): qty 2

## 2024-10-28 MED ORDER — BUPRENORPHINE HCL 2 MG SL SUBL
2.0000 mg | SUBLINGUAL_TABLET | Freq: Every day | SUBLINGUAL | Status: DC
  Administered 2024-10-28: 2 mg via SUBLINGUAL
  Filled 2024-10-28: qty 1

## 2024-10-28 MED ORDER — FUROSEMIDE 20 MG PO TABS: 20.0000 mg | ORAL_TABLET | Freq: Every day | ORAL | Status: DC

## 2024-10-28 MED ORDER — METOPROLOL TARTRATE 50 MG PO TABS: 50.0000 mg | ORAL_TABLET | Freq: Two times a day (BID) | ORAL | Status: DC

## 2024-10-28 MED ORDER — METFORMIN HCL ER 500 MG PO TB24
1000.0000 mg | ORAL_TABLET | Freq: Two times a day (BID) | ORAL | Status: DC
  Administered 2024-10-28 – 2024-11-10 (×27): 1000 mg via ORAL
  Filled 2024-10-28 (×21): qty 2

## 2024-10-28 MED ORDER — ALUM & MAG HYDROXIDE-SIMETH 200-200-20 MG/5ML PO SUSP
30.0000 mL | ORAL | Status: DC | PRN
  Administered 2024-11-03: 30 mL via ORAL
  Filled 2024-10-28: qty 30

## 2024-10-28 MED ORDER — PREGABALIN 75 MG PO CAPS
150.0000 mg | ORAL_CAPSULE | Freq: Every day | ORAL | Status: DC
  Administered 2024-10-28: 150 mg via ORAL
  Filled 2024-10-28: qty 2

## 2024-10-28 MED ORDER — METOPROLOL TARTRATE 50 MG PO TABS
50.0000 mg | ORAL_TABLET | Freq: Two times a day (BID) | ORAL | Status: DC
  Administered 2024-10-28: 50 mg via ORAL
  Filled 2024-10-28: qty 1

## 2024-10-28 MED ORDER — LOSARTAN POTASSIUM 50 MG PO TABS
100.0000 mg | ORAL_TABLET | Freq: Every day | ORAL | Status: DC
  Administered 2024-10-28: 100 mg via ORAL
  Filled 2024-10-28: qty 2

## 2024-10-28 MED ORDER — PANTOPRAZOLE SODIUM 40 MG PO TBEC
40.0000 mg | DELAYED_RELEASE_TABLET | Freq: Every day | ORAL | Status: DC
  Administered 2024-10-28 – 2024-11-10 (×14): 40 mg via ORAL
  Filled 2024-10-28 (×11): qty 1

## 2024-10-28 MED ORDER — BUPRENORPHINE HCL 2 MG SL SUBL
2.0000 mg | SUBLINGUAL_TABLET | Freq: Every day | SUBLINGUAL | Status: DC
  Administered 2024-10-29 – 2024-10-31 (×3): 2 mg via SUBLINGUAL
  Filled 2024-10-28 (×3): qty 1

## 2024-10-28 MED ORDER — AMMONIUM LACTATE 12 % EX CREA: 1.0000 | TOPICAL_CREAM | CUTANEOUS | Status: DC | PRN

## 2024-10-28 NOTE — Group Note (Signed)
 Date:  10/28/2024 Time:  10:28 AM  Group Topic/Focus: Goals group  Patients began by introducing themselves and sharing their favorite food as an research scientist (life sciences) activity. After that, they were given SMART goal worksheets to complete. Examples of SMART goals were provided, and the patients had time to fill out their worksheets. Once finished, each patient shared their icebreaker responses and SMART goals with the group, encouraging social interaction and goal-setting.   Participation Level:  Did Not Attend  Participation Quality:  N/A  Affect:  N/A  Cognitive:  N/A  Insight: None  Engagement in Group:  None  Modes of Intervention:  N/A  Additional Comments:  Pt did not attend goals group  Kristi HERO [facility] 10/28/2024, 10:28 AM

## 2024-10-28 NOTE — Progress Notes (Signed)
 Admission Note: report received from Mathews RN patient is a 75 VOL year old female from  Pt was calm and cooperative during assessment. Denies SI/HI/AVH. Admission plan of care reviewed with pt, consent signed.  Personal belongings/skin assessment completed.   No contraband found.  Patient oriented to the unit, staff and room.  Routine safety checks initiated.  Verbalizes understanding of unit rules/protocols.   Patient is presently safe on the unit. No unsafe behaviors noted.  Q 15 minute safety checks maintained per unit protocol.

## 2024-10-28 NOTE — Progress Notes (Signed)
" °   10/28/24 1000  Psych Admission Type (Psych Patients Only)  Admission Status Voluntary  Psychosocial Assessment  Patient Complaints None  Eye Contact Brief  Facial Expression Animated  Affect Appropriate to circumstance  Speech Logical/coherent  Interaction Assertive  Motor Activity Unsteady  Appearance/Hygiene In scrubs  Behavior Characteristics Cooperative  Mood Pleasant  Thought Process  Coherency WDL  Content WDL  Delusions None reported or observed  Perception Hallucinations  Hallucination Auditory  Judgment Impaired  Confusion None  Danger to Self  Current suicidal ideation? Denies  Danger to Others  Danger to Others None reported or observed    "

## 2024-10-28 NOTE — BHH Group Notes (Signed)
 LCSW Group Therapy Note  10/28/2024   10:00am-11:00am  Type of Therapy and Topic:  Group Therapy: Gratitude  Participation Level:  Did Not Attend   Description of Group:   In this group, patients shared and discussed the importance of acknowledging the elements in their lives for which they are grateful and how this can positively impact their mood.  The group discussed how bringing the positive elements of their lives to the forefront of their minds can help with recovery from any illness, physical or mental.  An exercise was done as a group in which a list was made of gratitude items in order to encourage participants to consider other potential positives in their lives.  Therapeutic Goals: Patients will discuss quotes about gratitude and explore how a change of attitude can make life more joyful. Patients will identify one or more items for which they are grateful in each of 6 categories:  people, experiences, things, places, skills, and other. Patients will discuss how it is possible to seek out gratitude in even bad situations. Patients will explore how the lack of gratitude can bring them down.   Summary of Patient Progress:  Patient was invited to group, did not attend.   Therapeutic Modalities:   Solution-Focused Therapy Activity  Mishika Flippen J Grossman-Orr, LCSW .
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
## 2024-10-28 NOTE — Group Note (Signed)
 Date:  10/28/2024 Time:  4:36 PM  Group Topic/Focus: Karaoke and bingo  Patients participated in a 20-minute karaoke session, which provided an opportunity for them to express their creativity, build confidence, and offer support to each other. Following the karaoke, the group spent the remainder of the hour playing bingo with this MHT. The activity fostered engagement and social interaction among the patients. Winners of [employer] game were rewarded with an extra snack as a positive reinforcement.    Participation Level:  Did Not Attend  Participation Quality:  N/A  Affect:  N/A  Cognitive:  N/A  Insight: None  Engagement in Group:  None  Modes of Intervention:  N/A  Additional Comments:  Pt did not attend this group  Kristi HERO [facility] 10/28/2024, 4:36 PM

## 2024-10-28 NOTE — BHH Group Notes (Signed)
 Pt did not attend the CSW group today, 10/28/24 (1000-1100)

## 2024-10-28 NOTE — Plan of Care (Signed)
   Problem: Education: Goal: Knowledge of [facility] Education information/materials will improve Outcome: Progressing Goal: Emotional status will improve Outcome: Progressing Goal: Mental status will improve Outcome: Progressing Goal: Verbalization of understanding the information provided will improve Outcome: Progressing

## 2024-10-28 NOTE — Group Note (Signed)
 Date:  10/28/2024 Time:  10:57 AM  Group Topic/Focus: Social wellness  This group focused on the concept of control--gaining it, losing it, and sharing it. The patients sat in a circle with a blank piece of paper and were instructed to draw something on it. A timer was set for one minute, and when it went off, patients were to pass their papers to the left. They were encouraged to add to the drawing, whether it related to the original theme or not. At the end of the activity, patients shared their drawings, reflected on what was added, and discussed their experiences together. The group then explored how it felt to share control and consider different perspectives. This activity promoted creative thinking and fostered positive social interactions.    Participation Level:  Active  Participation Quality:  Appropriate, Attentive, Sharing, and Supportive  Affect:  Appropriate  Cognitive:  Alert and Appropriate  Insight: Good and Improving  Engagement in Group:  Engaged and Improving  Modes of Intervention:  Activity, Discussion, Exploration, Socialization, and Support  Additional Comments:  Pt attended and actively participated in this group  Kristi HERO [facility] 10/28/2024, 10:57 AM

## 2024-10-28 NOTE — ED Provider Notes (Addendum)
----------------------------------------- °  12:41 AM on 10/28/2024 -----------------------------------------  Psychiatry recommends inpatient behavioral health treatment, will be transferred to another facility.   Gordan Huxley, MD 10/28/24 [id number]    Gordan Huxley, MD 10/28/24 [phone]

## 2024-10-28 NOTE — BHH Suicide Risk Assessment (Addendum)
 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 female with a past psychiatric history of bipolar I disorder and . Patient initially arrived to [facility] on 10/27/24 for visual and auditory hallucinations and was admitted to [facility] on 10/28/24 for crisis stabalization and stabilization of acute on chronic psychiatric conditions. PMHx is significant for hypertension, asthma, COPD, OSA, gastroparesis, NASH, type 2 diabetes, multiple strokes, osteoarthritis, recurrent UTIs, chronic low back pain, urinary incontinence.   On initial assessment, patient reports she was brought to the hospital by her caretaker, [name], due to having worsening visual and auditory hallucinations. Per chart review, patient has two recent hospitalizations, most recently on 08/19/24, for altered mental status and delirium secondary to severe UTI's in which she experienced similar symptoms. [name] brought her to [facility] this time because he thought she may have another UTI, but urinalysis was performed and was unremarkable. Patient states she has been having visual hallucinations of small children and animals crawling around her home. Additionally, she has been having extensive, in-depth conversations with her family members who are not actually there. She reports her caretaker [name] recorded her talking to herself for one hour, while she mistakenly thought she had  been conversing with her niece. She also reports poor sleep. She denies SI/HI.   Patient is unable to recall her current medication regimen, but does report she gets an Invega  Sustenna shot monthly. She received her first injection in September. She reports she missed her November injection due to the provider being out of town, but recently received her December injection. Medically, she reports a history of having 8 prior strokes and a history of seizures. She denies substance use. She denies prior psychiatric history, however appears to have followed outpatient at [facility] for PTSD, depression and has a previous hospitalization for depression.  Continued Clinical Symptoms:  Alcohol Use Disorder Identification Test Final Score (AUDIT): 0 The Alcohol Use Disorders Identification Test, Guidelines for Use in Primary Care, Second Edition.  [employer] [facility]). Score between 0-7:  no or low risk or alcohol related problems. Score between 8-15:  moderate risk of alcohol related problems. Score between 16-19:  high risk of alcohol related problems. Score 20 or above:  warrants further diagnostic evaluation for alcohol dependence and treatment.   CLINICAL FACTORS:   Schizophrenia Chronic Pain Currently Psychotic Previous Psychiatric Diagnoses and Treatments   Musculoskeletal: Strength & Muscle Tone: residual left sided weakness from stroke Gait & Station: uses wheelchair; residual left sided weakness from stroke Patient leans: N/A  Psychiatric Specialty Exam:  Presentation  General Appearance:  Appropriate for Environment; Casual  Eye Contact: Good  Speech: Slow; Clear and Coherent  Speech Volume: Normal  Handedness:No data recorded  Mood and Affect  Mood: Euthymic  Affect: Appropriate; Full Range   Thought Process  Thought Processes: Disorganized  Descriptions of Associations:Circumstantial  Orientation:No data recorded Thought Content:WDL  History of  Schizophrenia/Schizoaffective disorder:No data recorded Duration of Psychotic Symptoms:No data recorded Hallucinations:Hallucinations: Visual; Auditory  Ideas  of Reference:None  Suicidal Thoughts:Suicidal Thoughts: No  Homicidal Thoughts:Homicidal Thoughts: No   Sensorium  Memory: Immediate Fair; Recent Fair  Judgment: Fair  Insight: Fair   Chartered Certified Accountant: Fair  Attention Span: Fair  Recall: [employer] of Knowledge: Fair  Language: Fair   Psychomotor Activity  Psychomotor Activity: Psychomotor Activity: Normal   Assets  Assets: Desire for Improvement; Housing; Social Support; Resilience   Sleep  Sleep: Sleep: Poor    Physical Exam: Physical Exam Vitals and nursing note reviewed.  Constitutional:      General: She is not in acute distress.    Appearance: Normal appearance. She is normal weight. She is not ill-appearing.  HENT:     Head: Normocephalic and atraumatic.  Pulmonary:     Effort: Pulmonary effort is normal. No respiratory distress.  Neurological:     General: No focal deficit present.     Mental Status: She is alert and oriented to person, place, and time. Mental status is at baseline.     Comments: Pill-rolling tremor and lower extremity tremor    Review of Systems  Gastrointestinal:  Negative for abdominal pain, constipation, diarrhea, nausea and vomiting.  Neurological:  Negative for dizziness and headaches.  All other systems reviewed and are negative.  Blood pressure (!) 135/91, pulse 71, temperature 98 F (36.7 C), temperature source Oral, resp. rate 16, height 5' 7 (1.702 m), weight 96.4 kg, SpO2 99%. Body mass index is 33.3 kg/m.   COGNITIVE FEATURES THAT CONTRIBUTE TO RISK:  None    SUICIDE RISK:  Minimal: No identifiable suicidal ideation.  Patients presenting with no risk factors but with morbid ruminations; may be classified as minimal risk based on the severity of the depressive  symptoms  PLAN OF CARE: Medication management, group therapy and case management  I certify that inpatient services furnished can reasonably be expected to improve the patient's condition.   Lamar Handler Jama Slain, DO 10/28/2024, 3:45 PM

## 2024-10-28 NOTE — Group Note (Signed)
 Date:  10/28/2024 Time:  1:27 PM  Group Topic/Focus: Physical wellness  This physical wellness group focused on Zumba and music-based movement using YouTube videos. Patients were encouraged to engage in dancing with peers as a fun, accessible way to increase physical activity and promote circulation as an alternative to gym-based exercise.    Participation Level:  Did Not Attend  Participation Quality:  N/A  Affect:  N/A  Cognitive:  N/A  Insight: None  Engagement in Group:  None  Modes of Intervention:  N/A  Additional Comments:  Pt did not attend this group  Kristi HERO [facility] 10/28/2024, 1:27 PM

## 2024-10-28 NOTE — ED Notes (Signed)
 VOL/pt accepted to [facility] Alicia Mayo and has been transported there through [employer] at [facility]

## 2024-10-28 NOTE — Group Note (Deleted)
 Date:  10/28/2024 Time:  8:44 PM  Group Topic/Focus:  Self Esteem Action Plan:   The focus of this group is to help patients create a plan to continue to build self-esteem after discharge.     Participation Level:  {BHH PARTICIPATION OZCZO:77735}  Participation Quality:  {BHH PARTICIPATION QUALITY:22265}  Affect:  {BHH AFFECT:22266}  Cognitive:  {BHH COGNITIVE:22267}  Insight: {BHH Insight2:20797}  Engagement in Group:  {BHH ENGAGEMENT IN HMNLE:77731}  Modes of Intervention:  {BHH MODES OF INTERVENTION:22269}  Additional Comments:  ***  Alicia Mayo 10/28/2024, 8:44 PM

## 2024-10-28 NOTE — Consult Note (Signed)
 [facility] Consult Note  Patient Name: Alicia Mayo MRN: [id number] DOB: 02/21/1966 DATE OF Consult: 10/28/2024 Consult Order details:  Orders (From admission, onward)     Start     Ordered   10/27/24 1605  CONSULT TO CALL ACT TEAM       Ordering Provider: Clarine Ozell LABOR, MD  Provider:  (Not yet assigned)  Question:  Reason for Consult?  Answer:  Psych consult   10/27/24 1604   10/27/24 1605  IP CONSULT TO PSYCHIATRY       Ordering Provider: Clarine Ozell LABOR, MD  Provider:  (Not yet assigned)  Question:  Reason for consult:  Answer:  Medication management   10/27/24 1604            PRIMARY PSYCHIATRIC DIAGNOSES   1.  Bipolar I Disorder, Mixed Type, Severe with Psychotic Sx's 2.  Medication Side Effects   RECOMMENDATIONS  Recommendations: Medication recommendations: At this point, it is quite unclear exactly what medications patient should have been taking and what medications she has been taking.  It appears that within the past week she may have received her Invega  Sustenna 234 mg IM.  At this point, I think it makes sense to hold her other meds, even her buprenorphine , until we can get some idea of what she has been taking.  Has problems with several psych meds, including delirium with benzos, and cannot tolerate Seroquel, per Epic.  For now will just use hydroxyzine  10 mg q4h PRN anxiety until she can get into inpatient care and more thorough medication evaluation can be done via consultations with pharmacies and caretakers Non-Medication/therapeutic recommendations: Patient is not confused at this point, but will benefit from close monitoring until she is admitted to Psychiatry, given her overall mental status, primarily for physical safety.  Continue with matter-of-fact emotional support in ED, pending transfer Is inpatient psychiatric hospitalization recommended for this patient? Yes (Explain why): Even though patient is not suicidal or homicidal, it does appear that  she is having some mental status changes that are secondary to meds and med changes over the past weeks.  Some of these may have been physician-suggested, some not.  Given the complexity of her medication regiment, and the uncertainty about which meds are now active and which may even be in withdrawal state, needs to be inpatient admission for careful titration of med to avoid further deterioration. Is another care setting recommended for this patient? (examples may include Crisis Stabilization Unit, Residential/Recovery Treatment, ALF/SNF, Memory Care Unit)  No (Explain why): As above From a psychiatric perspective, is this patient appropriate for discharge to an outpatient setting/resource or other less restrictive environment for continued care?  No (Explain why): As above Follow-Up Telepsychiatry C/L services: We will sign off for now. Please re-consult our service if needed for any concerning changes in the patient's condition, discharge planning, or questions. Communication: Treatment team members (and family members if applicable) who were involved in treatment/care discussions and planning, and with whom we spoke or engaged with via secure text/chat, include the following: Secure message sent to Dr. Gordan, ED attending, and ED staff, outlining recommendations.  At this point, it is unclear what is the etiology of the patient's hallucinations.  She is, however, well-oriented, and she is aware that she is need medication adjustments to help reduce her psychotic sx's.  Of note, she claims that one of her caretakers reduced her buprenorphine  dosage within the week:  this may have been at doctor's orders, but this  too could be playing some role in her current presentation.  I personally spent a total of 30 minutes in the care of the patient today including preparing to see the patient, getting/reviewing separately obtained history, performing a medically appropriate exam/evaluation, counseling and  educating, placing orders, referring and communicating with other health care professionals, documenting clinical information in the EHR, and independently interpreting results.  Thank you for involving us  in the care of this patient. If you have any additional questions or concerns, please call [phone] and ask for the provider on-call.   TELEPSYCHIATRY ATTESTATION & CONSENT   As the provider for this telehealth consult, I attest that I verified the patients identity using two separate identifiers, introduced myself to the patient, provided my credentials, disclosed my location, and performed this encounter via a HIPAA-compliant, real-time, face-to-face, two-way, interactive audio and video platform and with the full consent and agreement of the patient (or guardian as applicable.)   Patient physical location: ED, [facility]. Telehealth provider physical location: home office in state of [place] .  Video start time: 0000h EST  Video end time: 0015h EST   IDENTIFYING DATA  Alicia Mayo is a 58 y.o. year-old female for whom a psychiatric consultation has been ordered by the primary provider. The patient was identified using two separate identifiers.  CHIEF COMPLAINT/REASON FOR CONSULT   My medicines aren't right.  These voices have been worse, and I'm not sure if I'm on all the medicines I'm supposed to be on, or whether I'm on too many.   HISTORY OF PRESENT ILLNESS (HPI)  The patient presents with long history of bipolar complicated, complicated by past CVA's and other medical challenges.  Brought in because of increasing hallucinations and somnolence over previous days.  Patient was alert and Ox4 throughout the interview.  Mentioned that she has been having issues with auditory hallucinations that have not been command in nature, but that have become increasingly disturbing to her.  There is also the chance that her Subutex  (for pain) was recently decreased.  It is  unclear what meds she should or should not be taking, and she is finding it hard to complete her ADL's.  Denies drug/EtOh usage.  No suicidal or homicidal ideation.   PAST PSYCHIATRIC HISTORY  As above Otherwise as per HPI above.  PAST MEDICAL HISTORY  Past Medical History:  Diagnosis Date   Asthma    COPD (chronic obstructive pulmonary disease) (HCC)    Diabetes mellitus without complication (HCC)    Fibromyalgia    Hypertension    Stroke [facility]) 2023   2023, 2008 and others. 8 strokes. R hemiparesis     HOME MEDICATIONS  Facility Ordered Medications  Medication   hydrOXYzine  (ATARAX ) tablet 10 mg   PTA Medications  Medication Sig   albuterol  (VENTOLIN  HFA) 108 (90 Base) MCG/ACT inhaler Inhale 2 puffs into the lungs every 4 (four) hours as needed.   ammonium lactate  (AMLACTIN) 12 % cream Apply 1 Application topically as needed.   buprenorphine  (SUBUTEX ) 2 MG SUBL SL tablet Place 2 mg under the tongue in the morning, at noon, and at bedtime.   Lurasidone  HCl 120 MG TABS Take 1 tablet by mouth daily.   trospium (SANCTURA) 20 MG tablet Take 20 mg by mouth 2 (two) times daily.   polyethylene glycol (MIRALAX  / GLYCOLAX ) 17 g packet Take 17 g by mouth daily as needed.   pantoprazole  (PROTONIX ) 40 MG tablet Take 40 mg by mouth daily.  metoprolol  tartrate (LOPRESSOR ) 50 MG tablet Take 50 mg by mouth 2 (two) times daily.   metFORMIN  (GLUCOPHAGE -XR) 500 MG 24 hr tablet Take 1,000 mg by mouth 2 (two) times daily.   losartan  (COZAAR ) 100 MG tablet Take 100 mg by mouth daily.   busPIRone  (BUSPAR ) 15 MG tablet Take 15 mg by mouth 3 (three) times daily.   escitalopram  (LEXAPRO ) 20 MG tablet Take 20 mg by mouth daily.   fluticasone -salmeterol (ADVAIR) 250-50 MCG/ACT AEPB Inhale 1 puff into the lungs 2 (two) times daily.   lidocaine  (LIDODERM ) 5 % Place 1 patch onto the skin daily.   LINZESS  290 MCG CAPS capsule Take 290 mcg by mouth daily.   rosuvastatin  (CRESTOR ) 5 MG tablet Take 5 mg by  mouth daily.   paliperidone  (INVEGA ) 3 MG 24 hr tablet Take 2 tablets (6 mg total) by mouth at bedtime.   benztropine  (COGENTIN ) 1 MG tablet Take 0.5 tablets (0.5 mg total) by mouth 2 (two) times daily as needed for tremors.   pregabalin  (LYRICA ) 150 MG capsule Take 1 capsule (150 mg total) by mouth 2 (two) times daily.   ondansetron  (ZOFRAN -ODT) 4 MG disintegrating tablet Take 1 tablet (4 mg total) by mouth every 6 (six) hours as needed for nausea or vomiting.   lactulose  (CHRONULAC ) 10 GM/15ML solution Take 45 mLs (30 g total) by mouth daily as needed for moderate constipation or severe constipation.   fluticasone  (FLONASE ) 50 MCG/ACT nasal spray Place 2 sprays into the nose daily.   fexofenadine (ALLEGRA) 180 MG tablet Take 180 mg by mouth daily.   INVEGA  SUSTENNA 234 MG/1.5ML injection Inject 234 mg into the muscle once.   furosemide  (LASIX ) 20 MG tablet Take 20 mg by mouth daily.   naloxone  (NARCAN ) nasal spray 4 mg/0.1 mL Place 1 spray into the nose once.     ALLERGIES  Allergies[1]  SOCIAL & SUBSTANCE USE HISTORY  Social History   Socioeconomic History   Marital status: Single    Spouse name: Not on file   Number of children: Not on file   Years of education: Not on file   Highest education level: Not on file  Occupational History   Not on file  Tobacco Use   Smoking status: Every Day    Types: Cigarettes   Smokeless tobacco: Never  Vaping Use   Vaping status: Never Used  Substance and Sexual Activity   Alcohol use: Not Currently   Drug use: Never   Sexual activity: Not on file  Other Topics Concern   Not on file  Social History Narrative   Not on file   Social Drivers of Health   Tobacco Use: High Risk (10/27/2024)   Patient History    Smoking Tobacco Use: Every Day    Smokeless Tobacco Use: Never    Passive Exposure: Not on file  Financial Resource Strain: Low Risk  (08/31/2023)   Received from [facility]   Overall Financial Resource  Strain (CARDIA)    Difficulty of Paying Living Expenses: Not very hard  Food Insecurity: No Food Insecurity (08/20/2024)   Epic    Worried About Running Out of Food in the Last Year: Never true    Ran Out of Food in the Last Year: Never true  Transportation Needs: No Transportation Needs (08/20/2024)   Epic    Lack of Transportation (Medical): No    Lack of Transportation (Non-Medical): No  Physical Activity: Inactive (11/03/2022)   Received from [facility]  Exercise Vital Sign    On average, how many days per week do you engage in moderate to strenuous exercise (like a brisk walk)?: 0 days    On average, how many minutes do you engage in exercise at this level?: 0 min  Stress: No Stress Concern Present (11/03/2022)   Received from [facility] - Occupational Stress Questionnaire    Feeling of Stress : Only a little  Social Connections: Socially Isolated (11/03/2022)   Received from [facility]   Social Connection and Isolation Panel    In a typical week, how many times do you talk on the phone with family, friends, or neighbors?: More than three times a week    How often do you get together with friends or relatives?: Twice a week    How often do you attend church or religious services?: Never    Do you belong to any clubs or organizations such as church groups, unions, fraternal or athletic groups, or school groups?: No    How often do you attend meetings of the clubs or organizations you belong to?: Never    Are you married, widowed, divorced, separated, never married, or living with a partner?: Divorced  Depression (PHQ2-9): Not on file  Alcohol Screen: Not on file  Housing: Low Risk (08/20/2024)   Epic    Unable to Pay for Housing in the Last Year: No    Number of Times Moved in the Last Year: 0    Homeless in the Last Year: No  Utilities: Not At Risk (08/20/2024)   Epic     Threatened with loss of utilities: No  Health Literacy: Not on file   Tobacco Use History[2] Social History   Substance and Sexual Activity  Alcohol Use Not Currently   Social History   Substance and Sexual Activity  Drug Use Never    Additional pertinent information Lives alone, with two caretakers that come regularly to work with her.SABRA  FAMILY HISTORY  History reviewed. No pertinent family history. Family Psychiatric History (if known):  Not reported  MENTAL STATUS EXAM (MSE)  Mental Status Exam: General Appearance: Fairly Groomed  Orientation:  Full (Time, Place, and Person)  Memory:  Immediate;   Fair Recent;   Fair Remote;   Fair  Concentration:  Concentration: Poor and Attention Span: Poor  Recall:  Fair  Attention  Poor  Eye Contact:  Fair  Speech:  Clear and Coherent  Language:  Good  Volume:  Normal  Mood: I'm kind of nervous about everything  Affect:  Constricted  Thought Process:  Coherent and Descriptions of Associations: Circumstantial  Thought Content:  Hallucinations: Auditory and Rumination  Suicidal Thoughts:  No  Homicidal Thoughts:  No  Judgement:  Impaired  Insight:  Shallow  Psychomotor Activity:  Restlessness  Akathisia:  Negative  Fund of Knowledge:  Fair    Assets:  Communication Skills Desire for Improvement Housing Social Support  Cognition:  WNL  ADL's:  Impaired  AIMS (if indicated):       VITALS  Blood pressure 127/82, pulse 71, temperature 97.7 F (36.5 C), resp. rate 18, height 5' 7 (1.702 m), weight 101.6 kg, SpO2 96%.  LABS  Admission on 10/27/2024  Component Date Value Ref Range Status   WBC 10/27/2024 10.6 (H)  4.0 - 10.5 K/uL Final   RBC 10/27/2024 3.76 (L)  3.87 - 5.11 MIL/uL Final   Hemoglobin 10/27/2024 10.9 (L)  12.0 -  15.0 g/dL Final   HCT 87/80/7974 33.0 (L)  36.0 - 46.0 % Final   MCV 10/27/2024 87.8  80.0 - 100.0 fL Final   MCH 10/27/2024 29.0  26.0 - 34.0 pg Final   MCHC 10/27/2024 33.0  30.0 - 36.0 g/dL  Final   RDW 87/80/7974 13.2  11.5 - 15.5 % Final   Platelets 10/27/2024 199  150 - 400 K/uL Final   nRBC 10/27/2024 0.0  0.0 - 0.2 % Final   Performed at [facility], [address] [place], [place] [address]   Glucose-Capillary 10/27/2024 178 (H)  70 - 99 mg/dL Final   Glucose reference range applies only to samples taken after fasting for at least 8 hours.   Comment 1 10/27/2024 Notify RN   Final   Comment 2 10/27/2024 Document in Chart   Final   Color, Urine 10/27/2024 YELLOW (A)  YELLOW Final   APPearance 10/27/2024 HAZY (A)  CLEAR Final   Specific Gravity, Urine 10/27/2024 1.013  1.005 - 1.030 Final   pH 10/27/2024 5.0  5.0 - 8.0 Final   Glucose, UA 10/27/2024 NEGATIVE  NEGATIVE mg/dL Final   Hgb urine dipstick 10/27/2024 NEGATIVE  NEGATIVE Final   Bilirubin Urine 10/27/2024 NEGATIVE  NEGATIVE Final   Ketones, ur 10/27/2024 NEGATIVE  NEGATIVE mg/dL Final   Protein, ur 87/80/7974 NEGATIVE  NEGATIVE mg/dL Final   Nitrite 87/80/7974 NEGATIVE  NEGATIVE Final   Leukocytes,Ua 10/27/2024 TRACE (A)  NEGATIVE Final   RBC / HPF 10/27/2024 0-5  0 - 5 RBC/hpf Final   WBC, UA 10/27/2024 6-10  0 - 5 WBC/hpf Final   Bacteria, UA 10/27/2024 NONE SEEN  NONE SEEN Final   Squamous Epithelial / HPF 10/27/2024 0-5  0 - 5 /HPF Final   Mucus 10/27/2024 PRESENT   Final   Performed at [facility], [address] [place], [place] [address]   Sodium 10/27/2024 143  135 - 145 mmol/L Final   Potassium 10/27/2024 4.0  3.5 - 5.1 mmol/L Final   Chloride 10/27/2024 104  98 - 111 mmol/L Final   CO2 10/27/2024 29  22 - 32 mmol/L Final   Glucose, Bld 10/27/2024 161 (H)  70 - 99 mg/dL Final   Glucose reference range applies only to samples taken after fasting for at least 8 hours.   BUN 10/27/2024 14  6 - 20 mg/dL Final   Creatinine, Ser 10/27/2024 1.15 (H)  0.44 - 1.00 mg/dL Final   Calcium  10/27/2024 9.4  8.9 - 10.3 mg/dL Final   Total Protein 87/80/7974 7.5  6.5 - 8.1 g/dL Final    Albumin 87/80/7974 4.0  3.5 - 5.0 g/dL Final   AST 87/80/7974 93 (H)  15 - 41 U/L Final   HEMOLYSIS AT THIS LEVEL MAY AFFECT RESULT   ALT 10/27/2024 23  0 - 44 U/L Final   Alkaline Phosphatase 10/27/2024 69  38 - 126 U/L Final   Total Bilirubin 10/27/2024 0.6  0.0 - 1.2 mg/dL Final   GFR, Estimated 10/27/2024 55 (L)  >60 mL/min Final   Comment: (NOTE) Calculated using the CKD-EPI Creatinine Equation (2021)    Anion gap 10/27/2024 10  5 - 15 Final   Performed at [facility], [address] [place], [place] [address]    PSYCHIATRIC REVIEW OF SYSTEMS (ROS)  ROS: Notable for the following relevant positive findings: Review of Systems  Constitutional: Negative.   HENT: Negative.    Eyes: Negative.   Respiratory: Negative.  Cardiovascular: Negative.   Gastrointestinal: Negative.   Genitourinary: Negative.   Musculoskeletal: Negative.   Skin: Negative.   Neurological: Negative.   Endo/Heme/Allergies: Negative.   Psychiatric/Behavioral:  Positive for hallucinations. The patient is nervous/anxious.     Additional findings:      Musculoskeletal: No abnormal movements observed      Gait & Station: Laying/Sitting      Pain Screening: Present - mild to moderate (states that Subutex  dose has been decreased)      Nutrition & Dental Concerns:  Reviewed   RISK FORMULATION/ASSESSMENT  Is the patient experiencing any suicidal or homicidal ideations: No       Explain if yes:   She is, however, experiencing auditory hallucinations and difficulty being able to complete ADL's.  Protective factors considered for safety management:   Patient does have caretakers coming regularly to monitor her care, but patient's mental status has continue to decline, especially with psychotic sx's.  Risk factors/concerns considered for safety management:  Physical illness/chronic pain Impulsivity Isolation Barriers to accessing treatment Unmarried  Is there a safety management plan with the  patient and treatment team to minimize risk factors and promote protective factors: No           Explain: Caretakers have noted deterioration in status, even with continuing to monitor her closely   Is crisis care placement or psychiatric hospitalization recommended: Yes     Based on my current evaluation and risk assessment, patient is determined at this time to be at:  High risk  *RISK ASSESSMENT Risk assessment is a dynamic process; it is possible that this patient's condition, and risk level, may change. This should be re-evaluated and managed over time as appropriate. Please re-consult psychiatric consult services if additional assistance is needed in terms of risk assessment and management. If your team decides to discharge this patient, please advise the patient how to best access emergency psychiatric services, or to call 911, if their condition worsens or they feel unsafe in any way.   Adriana JINNY Pontes, MD Telepsychiatry Consult Services    [1]  Allergies Allergen Reactions   Celecoxib Other (See Comments) and Swelling    Other Reaction: OTHER REACTION = SWELLING   Gabapentin Rash and Swelling    Pt says she gets face and throat swelling.   Levetiracetam Rash   Oxycodone-Acetaminophen  Itching, Nausea Only and Swelling    Pt states that nausea was the most significant effect. Pt states that percocet is tolerable when given ondansetron  (ZOFRAN ).   Quetiapine Other (See Comments) and Rash    Increased blood sugars.   Shellfish Allergy Swelling    Scallops, shellfish => swelling   Soap Swelling    Irish spring soap   Acetaminophen  Nausea And Vomiting   Alprazolam Other (See Comments)    Memory loss   Atorvastatin     Other reaction(s): Muscle Pain   Diazepam Other (See Comments)    Memory loss   Dicyclomine Other (See Comments)    Over-sedation   Ibuprofen Nausea And Vomiting    Other Reaction: TONGUE SWELING/BLEEDING   Ivp Dye [Iodinated Contrast Media] Itching    Oxycodone Itching and Swelling    Swelling of tongue   Propoxyphene     With tylenol    Sertraline Other (See Comments)    Other reaction(s): Unknown unknown   Trazodone Other (See Comments)    Over-sedation   Acetaminophen -Codeine Itching and Nausea Only   Aspirin Nausea Only and Swelling    Sinus swelling  Codeine Itching and Nausea Only    No associated rash. Itching stops on its own after a few days, but faster when given diphenhydramine.   Tramadol Swelling    Facial swelling   Zolpidem Nausea Only and Other (See Comments)    Causes memory loss  [2]  Social History Tobacco Use  Smoking Status Every Day   Types: Cigarettes  Smokeless Tobacco Never

## 2024-10-28 NOTE — Group Note (Signed)
 Date:  10/28/2024 Time:  5:26 PM    Group Topic/Focus:  Dimensions of Wellness:   The focus of this group is to introduce the topic of wellness using collage. Patients created intuitive collages: serving as a creative outlet for expressing emotions, reducing anxiety, fostering group cohesion and enabling personal insight and healing.   Participation Level:  Did Not Attend  Alicia Mayo 10/28/2024, 5:26 PM

## 2024-10-28 NOTE — Tx Team (Signed)
 Initial Treatment Plan 10/28/2024 6:59 AM Deette GLESSIE EUSTICE FMW:[id number]    PATIENT STRESSORS: Financial difficulties   Health problems     PATIENT STRENGTHS: Ability for insight  Active sense of humor  Motivation for treatment/growth    PATIENT IDENTIFIED PROBLEMS: Pt A/O x4 with noted paranoia and A/V/H. Able to verbalize needs. Meds whole. ADL's with assistance. Ambulate using cane and W/C while at the facility. Incont. of B/B using briefs. Pt's mood Pleasant. Health issues: HTN, Prior Strokex 8 left side weakness, Asthma, COPD, DM.                     DISCHARGE CRITERIA:  Ability to meet basic life and health needs Improved stabilization in mood, thinking, and/or behavior Medical problems require only outpatient monitoring Safe-care adequate arrangements made  PRELIMINARY DISCHARGE PLAN: Attend PHP/IOP Return to previous living arrangement  PATIENT/FAMILY INVOLVEMENT: This treatment plan has been presented to and reviewed with the patient, Kenlei E Schue. The patient and family have been given the opportunity to ask questions and make suggestions.  Bronwyn ONEIDA Sharps, RN 10/28/2024, 6:59 AM

## 2024-10-28 NOTE — H&P (Addendum)
 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 female with a past psychiatric history of bipolar I disorder and . Patient initially arrived to [facility] on 10/27/24 for visual and auditory hallucinations and was admitted to [facility] voluntarily on 10/28/24 for crisis stabalization and stabilization of acute on chronic psychiatric conditions. PMHx is significant for hypertension, asthma, COPD, OSA, gastroparesis, NASH, type 2 diabetes, multiple strokes, osteoarthritis, recurrent UTIs, chronic low back pain, urinary incontinence.  On initial assessment, patient reports she was brought to the hospital by her caretaker, [name], due to having worsening visual and auditory hallucinations. Per chart review, patient has two recent hospitalizations, most recently on 08/19/24, for altered mental status and delirium secondary to severe UTI's in which she experienced similar symptoms. [name] brought her to [facility] this time because he thought she may have another UTI, but urinalysis was performed and was unremarkable. Patient states she has been having visual hallucinations of small children and animals crawling around her home. Additionally, she has been having extensive, in-depth conversations with her family members who are not actually there. She reports her caretaker [name] recorded her talking to herself for one hour, while she mistakenly thought she had been conversing with her niece. She also reports poor sleep. She denies SI/HI.  Patient  is unable to recall her current medication regimen, but does report she gets an Invega  Sustenna shot monthly. She received her first injection in September. She reports she missed her November injection due to the provider being out of town, but recently received her December injection. Medically, she reports a history of having 8 prior strokes and a history of seizures. She denies substance use. She denies prior psychiatric history, however appears to have followed outpatient at [facility] for PTSD, depression and has a previous hospitalization for depression.  Collateral from [name] (caretaker) at [phone]: Spoke with [name], who is patient's caregiver and friend of 30 years. States he first begin noticing changes in patient's mental status earlier this year, such as memory loss. He reports over the last 2 months patient has had worsening mental status including having visual hallucinations of cats and little kids in her house and conversations with family members who are not present. States she's had slurring speech and difficulty walking. Reports her narcotic pain medication (Subutex ) has been decreased, and they have been giving her less doses of some medication to try to less her medication burden as they believe it's contributing to her changing mental status. States he feels patient has gotten worse after getting Invega  Sustenna shot. States he felt after missing her shot in November, she improved, and then worsened again after recently receiving her December injection.  Psychiatric ROS Mood Symptoms None reported. Anxiety Symptoms None reported. Trauma Symptoms None reported. Psychosis Symptoms Reports seeing small children, animals, and family members that aren't there. Reports having extensive conversations with family members that aren't there.  Past Psychiatric History: Current psychiatrist: None Current therapist: None Previous psychiatric diagnoses: bipolar I disorder,  anxiety Psychiatric Medications:  -Home Meds: Invega  Sustenna 234 mg, Cogentin  1 mg BID PRN, Buspar  15 mg TID, Lexapro  20  mg, Latuda  120 mg daily  -Past Med Trials: Trintellix, Valium, Trazodone, Lexapro , amitriptyline, Remeron  Psychiatric hospitalization(s): Yes Psychotherapy history: Denies Neuromodulation history: Denies History of suicide (obtained from HPI): Denies History of homicide or aggression (obtained in HPI): Denies NSSIB: Denies   Substance Abuse History: Patient denies history of substance use.  Past Medical History: Medical diagnoses: diabetes, HTN, hx of 8 strokes with residual left-sided weakness (last stroke 1 year ago), seizures, peripheral neuropathy Medications: subutex , allegra, Flonase , Advair, lasix , lactulose , linzess , losartan , pantoprazole , metoprolol , Lyrica , Crestor , Seroquel Allergies: celecoxib, gabapentin, keppra, quetiapine, tramadol PCP: Elsie Rubinstein, MD Hospitalizations: recent hospitalizations for severe UTI Surgeries: cholecystectomy, appendectomy Trauma: Denies Seizures: Yes  Social History: Current living situation: Lives alone in an apartment. Has two caretakers who see her daily. Education: Finished 11th grade. Required extra assistance during grade school. Occupational history: Retired Marital status: Divorced Children: Yes, 6 children but 1 passed away 2 years ago Legal: Denies Military: No  Family Psychiatric History: Bipolar disorder in her maternal aunt and mother. Schizophrenia in her child.  Family Medical History: Unknown  Columbia Scale:  Flowsheet Row [facility] (Current[facility] from 10/27/2024 in [facility] Department [facility] ED to Hosp-Admission (Discharged) from 08/19/2024 in [facility] ([facility])  C-SSRS RISK CATEGORY No Risk No Risk No Risk     Tobacco Screening:  Tobacco Use History[1]  BH Tobacco  Counseling     Are you interested in Tobacco Cessation Medications?  No, patient refused Counseled patient on smoking cessation:  Refused/Declined practical counseling Reason Tobacco Screening Not Completed: Patient Refused Screening    Allergies:   Allergies[2]  OBJECTIVE:  Physical Examination:  Physical Exam Vitals and nursing note reviewed.  Constitutional:      General: She is not in acute distress.    Appearance: Normal appearance. She is normal weight. She is not ill-appearing.  HENT:     Head: Normocephalic and atraumatic.  Pulmonary:     Effort: Pulmonary effort is normal. No respiratory distress.  Neurological:     General: No focal deficit present.     Mental Status: She is alert and oriented to person, place, and time. Mental status is at baseline.     Comments: Pill-rolling tremor and leg tremor    Review of Systems  Gastrointestinal:  Negative for abdominal pain, constipation, diarrhea, nausea and vomiting.  Neurological:  Negative for dizziness and headaches.  All other systems reviewed and are negative.  Blood pressure (!) 135/91, pulse 71, temperature 98 F (36.7 C), temperature source Oral, resp. rate 16, height 5' 7 (1.702 m), weight 96.4 kg, SpO2 99%. Body mass index is 33.3 kg/m.  Metabolic disorder labs:  Lab Results  Component Value Date   HGBA1C 10.3 (H) 08/03/2024   MPG 248.91 08/03/2024   No results found for: PROLACTIN No results found for: CHOL, TRIG, HDL, CHOLHDL, VLDL, LDLCALC  Results for orders placed or performed during the hospital encounter of 10/27/24 (from the past 48 hours)  CBG monitoring, ED     Status: Abnormal   Collection Time: 10/27/24  8:02 AM  Result Value Ref Range   Glucose-Capillary 178 (H) 70 - 99 mg/dL    Comment: Glucose reference range applies only to samples taken after fasting for at least 8 hours.   Comment 1 Notify RN    Comment 2 Document in Chart   CBC     Status: Abnormal   Collection  Time:  10/27/24  8:39 AM  Result Value Ref Range   WBC 10.6 (H) 4.0 - 10.5 K/uL   RBC 3.76 (L) 3.87 - 5.11 MIL/uL   Hemoglobin 10.9 (L) 12.0 - 15.0 g/dL   HCT 66.9 (L) 63.9 - 53.9 %   MCV 87.8 80.0 - 100.0 fL   MCH 29.0 26.0 - 34.0 pg   MCHC 33.0 30.0 - 36.0 g/dL   RDW 86.7 88.4 - 84.4 %   Platelets 199 150 - 400 K/uL   nRBC 0.0 0.0 - 0.2 %    Comment: Performed at [facility], [address] [place], [place] [address]  Urinalysis, Routine w reflex microscopic -Urine, Clean Catch     Status: Abnormal   Collection Time: 10/27/24 11:58 AM  Result Value Ref Range   Color, Urine YELLOW (A) YELLOW   APPearance HAZY (A) CLEAR   Specific Gravity, Urine 1.013 1.005 - 1.030   pH 5.0 5.0 - 8.0   Glucose, UA NEGATIVE NEGATIVE mg/dL   Hgb urine dipstick NEGATIVE NEGATIVE   Bilirubin Urine NEGATIVE NEGATIVE   Ketones, ur NEGATIVE NEGATIVE mg/dL   Protein, ur NEGATIVE NEGATIVE mg/dL   Nitrite NEGATIVE NEGATIVE   Leukocytes,Ua TRACE (A) NEGATIVE   RBC / HPF 0-5 0 - 5 RBC/hpf   WBC, UA 6-10 0 - 5 WBC/hpf   Bacteria, UA NONE SEEN NONE SEEN   Squamous Epithelial / HPF 0-5 0 - 5 /HPF   Mucus PRESENT     Comment: Performed at [facility], [address] [place], [place] [address]  Comprehensive metabolic panel with GFR     Status: Abnormal   Collection Time: 10/27/24  2:42 PM  Result Value Ref Range   Sodium 143 135 - 145 mmol/L   Potassium 4.0 3.5 - 5.1 mmol/L   Chloride 104 98 - 111 mmol/L   CO2 29 22 - 32 mmol/L   Glucose, Bld 161 (H) 70 - 99 mg/dL    Comment: Glucose reference range applies only to samples taken after fasting for at least 8 hours.   BUN 14 6 - 20 mg/dL   Creatinine, Ser 8.84 (H) 0.44 - 1.00 mg/dL   Calcium  9.4 8.9 - 10.3 mg/dL   Total Protein 7.5 6.5 - 8.1 g/dL   Albumin 4.0 3.5 - 5.0 g/dL   AST 93 (H) 15 - 41 U/L    Comment: HEMOLYSIS AT THIS LEVEL MAY AFFECT RESULT   ALT 23 0 - 44 U/L   Alkaline Phosphatase 69 38 - 126 U/L   Total Bilirubin  0.6 0.0 - 1.2 mg/dL   GFR, Estimated 55 (L) >60 mL/min    Comment: (NOTE) Calculated using the CKD-EPI Creatinine Equation (2021)    Anion gap 10 5 - 15    Comment: Performed at [facility], [address] [place], [place] [address]   Blood alcohol level:  Lab Results  Component Value Date   [facility] <15 03/10/2024   Current Medications: Current Facility-Administered Medications  Medication Dose Route Frequency Provider Last Rate Last Admin   albuterol  (VENTOLIN  HFA) 108 (90 Base) MCG/ACT inhaler 2 puff  2 puff Inhalation Q4H PRN Bobbitt, Shalon E, NP       alum & mag hydroxide-simeth (MAALOX/MYLANTA) 200-200-20 MG/5ML suspension 30 mL  30 mL Oral Q4H PRN Bobbitt, Shalon E, NP       ammonium lactate  (AMLACTIN) 12 % cream 1 Application  1 Application Topical PRN Mannie Ashley SAILOR, MD       [  START ON 10/29/2024] buprenorphine  (SUBUTEX ) SL tablet 2 mg  2 mg Sublingual Daily Chandra Charleston Christian Jama, DO       busPIRone  (BUSPAR ) tablet 15 mg  15 mg Oral TID Stevens, Briana N, MD   15 mg at 10/28/24 1451   escitalopram  (LEXAPRO ) tablet 20 mg  20 mg Oral Daily Mannie Ashley SAILOR, MD   20 mg at 10/28/24 1451   fluticasone  furoate-vilanterol (BREO ELLIPTA ) 200-25 MCG/ACT 1 puff  1 puff Inhalation Daily Mannie Ashley SAILOR, MD   1 puff at 10/28/24 1450   linaclotide  (LINZESS ) capsule 290 mcg  290 mcg Oral Daily Mannie Ashley SAILOR, MD   290 mcg at 10/28/24 1451   loratadine  (CLARITIN ) tablet 10 mg  10 mg Oral Daily Stevens, Briana N, MD   10 mg at 10/28/24 1451   [START ON 10/29/2024] losartan  (COZAAR ) tablet 100 mg  100 mg Oral Daily Bobbitt, Shalon E, NP       Lurasidone  HCl TABS 120 mg  2 tablet Oral Daily Mannie Ashley SAILOR, MD   120 mg at 10/28/24 1454   metFORMIN  (GLUCOPHAGE -XR) 24 hr tablet 1,000 mg  1,000 mg Oral BID Bobbitt, Shalon E, NP   1,000 mg at 10/28/24 1003   metoprolol  tartrate (LOPRESSOR ) tablet 50 mg  50 mg Oral BID Bobbitt, Shalon E, NP   50 mg at 10/28/24 1003   nicotine   (NICODERM CQ  - dosed in mg/24 hours) patch 14 mg  14 mg Transdermal Daily Pashayan, Alexander S, DO   14 mg at 10/28/24 1002   pantoprazole  (PROTONIX ) EC tablet 40 mg  40 mg Oral Daily Stevens, Briana N, MD   40 mg at 10/28/24 1451   pregabalin  (LYRICA ) capsule 150 mg  150 mg Oral QHS Mannie Ashley SAILOR, MD       rosuvastatin  (CRESTOR ) tablet 5 mg  5 mg Oral Daily Mannie Ashley SAILOR, MD   5 mg at 10/28/24 1451    PTA Medications: Medications Prior to Admission  Medication Sig Dispense Refill Last Dose/Taking   albuterol  (VENTOLIN  HFA) 108 (90 Base) MCG/ACT inhaler Inhale 2 puffs into the lungs every 4 (four) hours as needed.      ammonium lactate  (AMLACTIN) 12 % cream Apply 1 Application topically as needed.      benztropine  (COGENTIN ) 1 MG tablet Take 0.5 tablets (0.5 mg total) by mouth 2 (two) times daily as needed for tremors.      buprenorphine  (SUBUTEX ) 2 MG SUBL SL tablet Place 2 mg under the tongue in the morning, at noon, and at bedtime.      busPIRone  (BUSPAR ) 15 MG tablet Take 15 mg by mouth 3 (three) times daily.      escitalopram  (LEXAPRO ) 20 MG tablet Take 20 mg by mouth daily.      fexofenadine (ALLEGRA) 180 MG tablet Take 180 mg by mouth daily.      fluticasone  (FLONASE ) 50 MCG/ACT nasal spray Place 2 sprays into the nose daily.      fluticasone -salmeterol (ADVAIR) 250-50 MCG/ACT AEPB Inhale 1 puff into the lungs 2 (two) times daily.      furosemide  (LASIX ) 20 MG tablet Take 20 mg by mouth daily.      INVEGA  SUSTENNA 234 MG/1.5ML injection Inject 234 mg into the muscle once.      lactulose  (CHRONULAC ) 10 GM/15ML solution Take 45 mLs (30 g total) by mouth daily as needed for moderate constipation or severe constipation. 236 mL 0    lidocaine  (LIDODERM ) 5 % Place  1 patch onto the skin daily.      LINZESS  290 MCG CAPS capsule Take 290 mcg by mouth daily.      losartan  (COZAAR ) 100 MG tablet Take 100 mg by mouth daily.      Lurasidone  HCl 120 MG TABS Take 1 tablet by mouth daily.       metFORMIN  (GLUCOPHAGE -XR) 500 MG 24 hr tablet Take 1,000 mg by mouth 2 (two) times daily.      metoprolol  tartrate (LOPRESSOR ) 50 MG tablet Take 50 mg by mouth 2 (two) times daily.      naloxone  (NARCAN ) nasal spray 4 mg/0.1 mL Place 1 spray into the nose once.      ondansetron  (ZOFRAN -ODT) 4 MG disintegrating tablet Take 1 tablet (4 mg total) by mouth every 6 (six) hours as needed for nausea or vomiting. 20 tablet 0    paliperidone  (INVEGA ) 3 MG 24 hr tablet Take 2 tablets (6 mg total) by mouth at bedtime. 30 tablet 0    pantoprazole  (PROTONIX ) 40 MG tablet Take 40 mg by mouth daily.      polyethylene glycol (MIRALAX  / GLYCOLAX ) 17 g packet Take 17 g by mouth daily as needed.      pregabalin  (LYRICA ) 150 MG capsule Take 1 capsule (150 mg total) by mouth 2 (two) times daily.      rosuvastatin  (CRESTOR ) 5 MG tablet Take 5 mg by mouth daily.      trospium (SANCTURA) 20 MG tablet Take 20 mg by mouth 2 (two) times daily.       Mental Status Exam:  Appearance: black female wearing hospital scrubs, sitting in wheelchair  Behavior: calm and cooperative; makes appropriate eye contact  Attitude: Pleasant  Speech: slow rate. Normal rhythm and tone  Mood: Euthymic  Affect: Congruent, full, appropriate  Thought Process: Circumstantial  Thought Content: No delusional or paranoid thought content endorsed.  SI/HI: Denies  Perceptions: Reports seeing small children, animals, and family members that aren't there. Reports having extensive conversations with family members that aren't there.  Judgement: Fair  Insight: Fair  Fund of Knowledge: WNL    Diagnosis - Schizoaffective disorder bipolar type vs major neurocognitive disorder secondary to numerous strokes - Rule out Lewy body dementia/Parkinson's due to tremors and severe decline in physical and mental health after receiving risperidone LAI - Rule out tardive dyskinesia   ASSESSMENT: Patient is a 58 year old female with a history of bipolar  I disorder and multiple prior strokes, presenting with visual and auditory hallucinations. She has had several recent medical hospitalizations for altered mental status, previously thought to be due to hypoactive delirium secondary to recurrent UTIs and medication polypharmacy. Urinalysis during the current admission was unremarkable. CT Head notable for frontal lobe atrophy. Physical exam notable for pill-rolling tremor and lower extremity tremor. Possible etiologies for her current presentation include worsening of her primary psychiatric disorder, major neurocognitive disorder with psychotic features, and persistent low-level delirium, likely in combination. Her presentation appears predominantly neuropsychiatric in nature. Notably, her symptoms temporarily worsen following administration of Invega  Sustenna injections. If the patient has an underlying movement disorder such as Parkinsons disease, Lewy body dementia (LBD), or tardive dyskinesia (TD) this could explain her negative reaction to Invega  given its strong dopamine antagonism. Further assessment is needed to evaluate for Parkinsons disease, LBD, and TD.  During a prior hospitalization in May 2023 at [facility], patient exhibited generalized weakness, intermittent dysarthria, slurred speech, and gait difficulties in the setting of delirium and acute toxic-metabolic encephalopathy. According to  her caregiver, similar symptoms have been present for the past two months. This history suggests the possibility of an unresolved, chronic low-level delirium. Medication management goals include reducing polypharmacy and anticholinergic burden while avoiding strong dopamine antagonists. The patient received her most recent Invega  Sustenna injection earlier this week, therefore, serum levels will need time to decrease. Strong dopamine-antagonists should be avoided in the future.  PLAN:  Psychiatric Diagnoses and Treatment  # Schizoaffective disorder-depressive  type # Major neurocognitive disorder of unknown etiology with psychotic disturbance (8 x reported strokes) # Rule out Parkinson's or Lewy body dementia due to patient's decline on risperidone and apparent left handed pill-rolling tremor and tremor in the legs. -- Discontinue Cogentin  due to strong anticholinergic properties and risk of delirium -- Continue Buspar  15 mg TID -- Continue Lexapro  20 mg daily -- Continue Latuda  120 mg daily -- Recommend discontinuing Invega  LAI due to recent decline in physical and mental status after administration in early December. -- Consider a trial of carbidopa /levodopa   PRN's - Trazodone 50 mg at bedtime as needed for insomnia - Atarax  10 mg QID as needed for anxiety - Agitation Protocol: As  2. Active Medical Issues  #Chronic pain / neuropathy -- Decrease Lyrica  to 150 mg qhs -- Reduce Subutex  2 mg sublingual daily due to potential of excessive sedation and delirium  #Asthma / Allergies -- Albuterol  -- Breo-Ellipta -- Flonase  -- Loratidine  #HTN / HLD -- Losartan  100 mg daily -- Metoprolol  50 mg daily -- Crestor  5 mg daily  #T2DM -- Metformin  1000 mg BID  #GERD / IBS -- Protonix  -- Linzess   #Nicotine  withdrawal - Patient in need of nicotine  replacement; nicotine  patch 14 mg / 24 hours ordered. Smoking cessation encouraged  Other as needed medications  Tylenol  650 mg every 6 hours as needed for pain Mylanta 30 mL every 4 hours as needed for indigestion Milk of magnesia 30 mL daily as needed for constipation  The risks/benefits/side-effects/alternatives to the above medication(s) were discussed in detail with the patient and time was given for questions. The patient consents to medication trial. FDA black box warnings, if present, were discussed.  3. Safety and Monitoring: - Voluntary admission to inpatient psychiatric unit for safety, stabilization and treatment - Daily contact with patient to assess and evaluate symptoms and  progress in treatment - Patient's case to be discussed in multi-disciplinary team meeting - Observation Level: q15 minute checks - Vital signs:  q12 hours - Precautions: suicide, elopement, and assault  4. Routine and other pertinent labs: EKG monitoring: QTc: 440 on 10/27/24  Metabolism / endocrine: BMI: Body mass index is 33.3 kg/m.  CBC: WBC 10.6, Hgb 10.9 CMP: Glucose 161, Cr 1.15, AST 93, GFR 55 UDS: not performed Ethanol: not performed UA: unremarkable TSH: not performed A1c: 10.3 (08/03/24) Lipid panel: not performed  CT Head - evidence of frontal lobe atrophy  5.   Group Therapy: - Encouraged patient to participate in unit milieu and in scheduled group therapies  - Short Term Goals: Ability to maintain clinical measurements within normal limits will improve - Long Term Goals: Improvement in symptoms so as ready for discharge - Patient is encouraged to participate in group therapy while admitted to the psychiatric unit. - We will address other chronic and acute stressors, which contributed to the patient's Schizoaffective disorder (HCC) in order to reduce the risk of self-harm at discharge.  7.   Discharge Planning:  - Social work and case management to assist with discharge planning and identification  of hospital follow-up needs prior to discharge - Estimated LOS: 5-7 days - Discharge Concerns: Need to establish a safety plan; Medication compliance and effectiveness - Discharge Goals: Return home with outpatient referrals for mental health follow-up including medication management/psychotherapy  I certify that inpatient services furnished can reasonably be expected to improve the patient's condition.      Lamar Handler Jama Slain, DO PGY-1, Psychiatry Residency  12/20/20253:43 PM        Total Time Spent in Direct Patient Care:  I personally spent 75 minutes on the unit in direct patient care. The direct patient care time included face-to-face time with the  patient, reviewing the patient's chart, communicating with other professionals, and coordinating care.   I have independently evaluated the patient during a face-to-face assessment. I reviewed the patient's chart, and I participated in key portions of the service. I discussed the case with the Resident, and I agree with the assessment and plan of care as documented in the Resident's note, as addended by me or notated below:   Patient presents reported recent weakness, onset of auditory/visual hallucinations that are vivid in nature.  Patient has a long history of cerebrovascular accidents (around 8 strokes) as well as numerous episodes of acute weakness and altered mental status.  In reading her charts, there has been concern of polypharmacy in the past and the patient presenting with altered mental status likely due to excessive medication burden. Recent decline has been secondary to administration of Invega  LAI as well as recent UTI.  Her caregiver believes that her medication burden has worsened her cognition and caused a steep decline in physical status.  The rapid decline in both mental and physical health after administration of Invega  LAI indicates a possible parkinsonian disease such as Lewy body dementia or Parkinson's disease.   Today will discontinue Cogentin , reduce Lyrica  and reduce buprenorphine .  Recommend patient no longer receives strong neuroleptic medications and utilize antipsychotics with low D2 blockade.  Fall precautions.  Differential diagnosis was likely a combination of sequelae from previous strokes, low-grade delirium, possible Lewy body/Parkinson's disease and/or primary thought disorder.    Goals for this admission will be to reduce her anticholinergic burden, reduce excessively sedating medications such as opioids and reduce strong antidopamine agents.  Also to determine greater diagnostic clarity.  Lamar Slain DO Psychiatrist      [1]  Social History Tobacco Use   Smoking Status Every Day   Current packs/day: 1.00   Types: Cigarettes  Smokeless Tobacco Never  [2]  Allergies Allergen Reactions   Celecoxib Other (See Comments) and Swelling    Other Reaction: OTHER REACTION = SWELLING   Gabapentin Rash and Swelling    Pt says she gets face and throat swelling.   Levetiracetam Rash   Oxycodone-Acetaminophen  Itching, Nausea Only and Swelling    Pt states that nausea was the most significant effect. Pt states that percocet is tolerable when given ondansetron  (ZOFRAN ).   Quetiapine Other (See Comments) and Rash    Increased blood sugars.   Shellfish Allergy Swelling    Scallops, shellfish => swelling   Soap Swelling    Irish spring soap   Acetaminophen  Nausea And Vomiting   Alprazolam Other (See Comments)    Memory loss   Atorvastatin     Other reaction(s): Muscle Pain   Diazepam Other (See Comments)    Memory loss   Dicyclomine Other (See Comments)    Over-sedation   Ibuprofen Nausea And Vomiting    Other Reaction: TONGUE  SWELING/BLEEDING   Ivp Dye [Iodinated Contrast Media] Itching   Oxycodone Itching and Swelling    Swelling of tongue   Propoxyphene     With tylenol    Sertraline Other (See Comments)    Other reaction(s): Unknown unknown   Trazodone Other (See Comments)    Over-sedation   Acetaminophen -Codeine Itching and Nausea Only   Aspirin Nausea Only and Swelling    Sinus swelling    Codeine Itching and Nausea Only    No associated rash. Itching stops on its own after a few days, but faster when given diphenhydramine.   Tramadol Swelling    Facial swelling   Zolpidem Nausea Only and Other (See Comments)    Causes memory loss

## 2024-10-28 NOTE — Plan of Care (Signed)

## 2024-10-29 DIAGNOSIS — F0392 Unspecified dementia, unspecified severity, with psychotic disturbance: Secondary | ICD-10-CM | POA: Diagnosis not present

## 2024-10-29 LAB — URINE CULTURE

## 2024-10-29 LAB — GLUCOSE, CAPILLARY
Glucose-Capillary: 183 mg/dL — ABNORMAL HIGH (ref 70–99)
Glucose-Capillary: 126 mg/dL — ABNORMAL HIGH (ref 70–99)

## 2024-10-29 LAB — LIPID PANEL
Cholesterol: 118 mg/dL (ref 0–200)
HDL: 30 mg/dL — ABNORMAL LOW
LDL Cholesterol: 48 mg/dL (ref 0–99)
Total CHOL/HDL Ratio: 3.9 ratio
Triglycerides: 200 mg/dL — ABNORMAL HIGH
VLDL: 40 mg/dL (ref 0–40)

## 2024-10-29 LAB — HEMOGLOBIN A1C
Hgb A1c MFr Bld: 9.7 % — ABNORMAL HIGH (ref 4.8–5.6)
Mean Plasma Glucose: 231.69 mg/dL

## 2024-10-29 MED ORDER — MELATONIN 3 MG PO TABS
3.0000 mg | ORAL_TABLET | Freq: Once | ORAL | Status: AC | PRN
  Administered 2024-10-30: 3 mg via ORAL
  Filled 2024-10-29: qty 1

## 2024-10-29 MED ORDER — LURASIDONE HCL 80 MG PO TABS
80.0000 mg | ORAL_TABLET | Freq: Every day | ORAL | Status: DC
  Administered 2024-10-30: 80 mg via ORAL
  Filled 2024-10-29: qty 1

## 2024-10-29 NOTE — Plan of Care (Cosign Needed Addendum)
 Pt has been in room all day with the exception of coming to the med room for scheduled medications. Pt has had sitter all day which has assisted pt with all ADLs (eating, changing, toiletting, peri-care). Pt is incontinent at times and needs strong coaxing to eat. She puts the utensil to mouth but doesn't put it in unless MHT reminds to do so. At times she will just look at the food. She is A&Ox4. She is pleasant in demeanor. Geri-psych is highly recommended.

## 2024-10-29 NOTE — Progress Notes (Signed)
 Resting with eyes closed, resp even and unlabored, 1:1 maintained without problems. Pt took night meds prior to bedtime without problem.

## 2024-10-29 NOTE — Progress Notes (Signed)
 Patient in restroom, denies any complaints. States she has some diarrhea. Pt states she doesn't need anything for it because she took a laxative earlier d/t being constipated for 4 days.

## 2024-10-29 NOTE — Progress Notes (Signed)
 Resting with eyes closed, resp even and unlabored. Pt had 3 loose Bms since beginning of shift and has been to the restroom for just urination since then. No problems noted or stated. 1:1 observation maintained.

## 2024-10-29 NOTE — Group Note (Signed)
 Date:  10/29/2024 Time:  10:13 PM  Group Topic/Focus:  Wrap-Up Group:   The focus of this group is to help patients review their daily goal of treatment and discuss progress on daily workbooks.    Participation Level:  Did Not Attend  Participation Quality:  none  Affect:  n/a  Cognitive:  n/a  Insight: None  Engagement in Group:  None  Modes of Intervention:  none  Additional Comments:   Pt did not attend wrap up group  Amia Rynders A Addisynn Vassell 10/29/2024, 10:13 PM

## 2024-10-29 NOTE — Progress Notes (Signed)
 Patient resting in bed with eyes closed and didn't wake up when this nurse spoke with sitter present in the room. Walker next to bed. Asked sitter to tell pt meds are ready when she wakes up.

## 2024-10-29 NOTE — Group Note (Signed)
 Date:  10/29/2024 Time:  10:55 AM  Group Topic/Focus:  Goals Group:   The focus of this group is to help patients establish daily goals to achieve during treatment and discuss how the patient can incorporate goal setting into their daily lives to aide in recovery.    Participation Level:  Did Not Attend   Alicia Mayo 10/29/2024, 10:55 AM

## 2024-10-29 NOTE — Group Note (Signed)
 Date:  10/29/2024 Time:  7:39 PM  Group Topic/Focus: The affect illicit drugs have on dopamine and alternative means of getting dopamine hits safely. Making Healthy Choices:   The focus of this group is to help patients identify negative/unhealthy choices they were using prior to admission and identify positive/healthier coping strategies to replace them upon discharge.      Participation Level:  Did Not Attend   Alicia Mayo 10/29/2024, 7:39 PM

## 2024-10-29 NOTE — Progress Notes (Signed)
(  Sleep Hours) -10.0 as of 0530 (Any PRNs that were needed, meds refused, or side effects to meds)- none (Any disturbances and when (visitation, over night)-none (Concerns raised by the patient)- none (SI/HI/AVH)- denies all

## 2024-10-29 NOTE — Progress Notes (Addendum)
 [facility] Inpatient Psychiatry Progress Note  Date: 10/29/2024 Patient: Alicia Mayo MRN: [id number]  Assessment and Plan: Patient is a 58 year old female with a history of bipolar I disorder (though unable to identify any hospitalizations for this) and multiple prior strokes, presenting with visual and auditory hallucinations. She has had several recent medical hospitalizations for altered mental status, previously thought to be due to hypoactive delirium secondary to recurrent UTIs and medication polypharmacy. Urinalysis during the current admission was unremarkable. CT Head notable for frontal lobe atrophy. Physical exam notable for pill-rolling tremor and lower extremity tremor. Possible etiologies for her current presentation include worsening of her primary psychiatric disorder, major neurocognitive disorder with psychotic features, and persistent low-level delirium, likely in combination. Her presentation appears predominantly neuropsychiatric in nature. Notably, her symptoms temporarily worsen following administration of Invega  Sustenna injections. If the patient has an underlying movement disorder such as Parkinsons disease, Lewy body dementia (LBD), or tardive dyskinesia (TD) this could explain her negative reaction to Invega  given its strong dopamine antagonism. Further assessment is needed to evaluate for Parkinsons disease, LBD, and TD.  During a prior hospitalization in May 2023 at [facility], patient exhibited generalized weakness, intermittent dysarthria, slurred speech, and gait difficulties in the setting of delirium and acute toxic-metabolic encephalopathy. According to her caregiver, similar symptoms have been present for the past two months. This history suggests the possibility of an unresolved, chronic low-level delirium. Medication management goals include reducing polypharmacy and anticholinergic burden while avoiding strong dopamine antagonists. The patient  received her most recent Invega  Sustenna injection earlier this week, therefore, serum levels will need time to decrease. Strong dopamine-antagonists should be avoided in the future.      12/21:  Alert/oriented by 4.  Thought process is concrete and childlike.  I did not observe any hallucinations or delusional content.  Continues to have psychomotor slowing and likely experiencing sedating effects from her polypharmacy.  The primary goal of this admission going forward is optimization of her polypharmacy and further diagnostic clarity.  Attention is poor though this is likely due to reduced intellectual capability as she is unable to perform basic math skills.  Able to perform other basic attention tasks such as answering questions related to her health.  1:1 sitter reports no behavioral dysregulation, hallucinations, delusions or confusion. - Ordering ammonia, ACHS blood glucose, vitamin D , lipids and hemoglobin A1c - Due to polypharmacy, sedation and concern of chronic delirium I am reducing Latuda  to 80 mg and discontinuing Lyrica  today.  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 -- Discontinue Cogentin  due to strong anticholinergic properties and risk of delirium -- Reduce Latuda  80 mg on 10/29/2024 -- Continue Buspar  15 mg TID -- Continue Lexapro  20 mg daily -- Recommend discontinuing Invega  LAI due to recent decline in physical and mental status after administration in early December. -- Consider a trial of carbidopa /levodopa  vs Major NeuroCognitive D/O (hx of 8 x strokes)   Labs - Ordering ammonia, hemoglobin A1c, lipid panel, vitamin D  on 10/29/2024    #Chronic pain / neuropathy -- Discontinued  Lyrica  due to sedation on 10/29/2024 -- Reduce Subutex  2 mg sublingual daily due to potential of excessive sedation and delirium   #Asthma / Allergies -- Albuterol  -- Breo-Ellipta -- Flonase  --  Loratidine   #HTN / HLD -- Losartan  100 mg daily -- Metoprolol  50 mg daily -- Crestor  5 mg daily   #T2DM -- Metformin  1000 mg BID --AC nightly POCT blood glucose -Ordering hemoglobin A1c   #GERD / IBS -- Protonix  -- Linzess    #Nicotine  withdrawal - Patient in need of nicotine  replacement; nicotine  patch 14 mg / 24 hours ordered. Smoking cessation encouraged  Risk Assessment -   Discharge Planning Barriers to discharge: Functionality Estimated length of stay: 5 to 7 days Predicted Discharge location: Back home to her caregiver     Interval History and update:  Chart reviewed, no events overnight Vital signs stable On my assessment patient is found resting in her room.  She is easily aroused.  Oriented to person, place, time and situation.  Attention is generally poor and unable to list months of the year backwards or do any serial sevens.  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  Additionally, I suspect reduced IQ and likely intellectual disability given her history.  Her 1:1 mental health tech reports no confusion, no hallucinations, no aggression, no psychotic features and no delusional content.  Denies any falls or major patient complaints.  Does endorse some weakness and mild sedation.  Patient denied SI, HI and AVH.  She did not appear to be responding to internal stimuli and there is no delusional content elicited.  Sleep is excessive and appetite is adequate.  I encouraged the health tech to get the patient to recreation and get her out to have light and sunlight on her.  Patient has no complaints.  Review of systems -Denies nausea, vomiting, diarrhea,  abdominal pain, chest pain or shortness of breath.     Physical Exam MSK-week requires aid of wheelchair or walker Neuro-mildly sedated, alert and oriented to person, place, time and situation   Mental Status Exam Appearance -older than stated age and disheveled Attitude - Calm, polite, not guarded Speech -low volume, childlike speech Mood -doing good Affect -blunted Thought Process -concrete Thought Content - No delusional content expressed SI/HI - Denies  Perceptions - Denies AVH; not RIS Judgement/Insight -impaired Fund of knowledge -poor Language -fair      Lab Results:  No visits with results within 1 Day(s) from this visit.  Latest known visit with results is:  Admission on 10/27/2024, Discharged on 10/28/2024  Component Date Value Ref Range Status   WBC 10/27/2024 10.6 (H)  4.0 - 10.5 K/uL Final   RBC 10/27/2024 3.76 (L)  3.87 - 5.11 MIL/uL Final   Hemoglobin 10/27/2024 10.9 (L)  12.0 - 15.0 g/dL Final   HCT 87/80/7974 33.0 (L)  36.0 - 46.0 % Final   MCV 10/27/2024 87.8  80.0 - 100.0 fL Final   MCH 10/27/2024 29.0  26.0 - 34.0 pg Final   MCHC 10/27/2024 33.0  30.0 - 36.0 g/dL Final   RDW 87/80/7974 13.2  11.5 - 15.5 % Final   Platelets 10/27/2024 199  150 - 400 K/uL Final   nRBC 10/27/2024 0.0  0.0 - 0.2 % Final   Glucose-Capillary 10/27/2024 178 (H)  70 - 99 mg/dL Final   Comment 1 87/80/7974 Notify RN   Final   Comment 2 10/27/2024 Document in Chart   Final   Color, Urine 10/27/2024 YELLOW (A)  YELLOW Final   APPearance 10/27/2024 HAZY (A)  CLEAR Final   Specific Gravity, Urine 10/27/2024 1.013  1.005 - 1.030 Final  pH 10/27/2024 5.0  5.0 - 8.0 Final   Glucose, UA 10/27/2024 NEGATIVE  NEGATIVE mg/dL Final   Hgb urine dipstick 10/27/2024 NEGATIVE  NEGATIVE Final   Bilirubin Urine 10/27/2024 NEGATIVE  NEGATIVE Final   Ketones, ur 10/27/2024 NEGATIVE  NEGATIVE mg/dL Final   Protein, ur 87/80/7974 NEGATIVE  NEGATIVE mg/dL Final   Nitrite 87/80/7974 NEGATIVE   NEGATIVE Final   Leukocytes,Ua 10/27/2024 TRACE (A)  NEGATIVE Final   RBC / HPF 10/27/2024 0-5  0 - 5 RBC/hpf Final   WBC, UA 10/27/2024 6-10  0 - 5 WBC/hpf Final   Bacteria, UA 10/27/2024 NONE SEEN  NONE SEEN Final   Squamous Epithelial / HPF 10/27/2024 0-5  0 - 5 /HPF Final   Mucus 10/27/2024 PRESENT   Final   Sodium 10/27/2024 143  135 - 145 mmol/L Final   Potassium 10/27/2024 4.0  3.5 - 5.1 mmol/L Final   Chloride 10/27/2024 104  98 - 111 mmol/L Final   CO2 10/27/2024 29  22 - 32 mmol/L Final   Glucose, Bld 10/27/2024 161 (H)  70 - 99 mg/dL Final   BUN 87/80/7974 14  6 - 20 mg/dL Final   Creatinine, Ser 10/27/2024 1.15 (H)  0.44 - 1.00 mg/dL Final   Calcium  10/27/2024 9.4  8.9 - 10.3 mg/dL Final   Total Protein 87/80/7974 7.5  6.5 - 8.1 g/dL Final   Albumin 87/80/7974 4.0  3.5 - 5.0 g/dL Final   AST 87/80/7974 93 (H)  15 - 41 U/L Final   ALT 10/27/2024 23  0 - 44 U/L Final   Alkaline Phosphatase 10/27/2024 69  38 - 126 U/L Final   Total Bilirubin 10/27/2024 0.6  0.0 - 1.2 mg/dL Final   GFR, Estimated 10/27/2024 55 (L)  >60 mL/min Final   Anion gap 10/27/2024 10  5 - 15 Final   Specimen Description 10/27/2024    Final                   Value:URINE, CLEAN CATCH Performed at [facility], [address], [place], [place] [address]    Special Requests 10/27/2024    Final                   Value:NONE Performed at [facility], [address] [place], [place] [address]    Culture 10/27/2024    Final                   Value:TOO YOUNG TO READ Performed at [facility], [address], [place], [place] [address]    Report Status 10/27/2024 PENDING   Incomplete     Vitals: Blood pressure 134/72, pulse 86, temperature 98.1 F (36.7 C), temperature source Oral, resp. rate 16, height 5' 7 (1.702 m), weight 96.4 kg, SpO2 99%.    Lamar Handler Jama Slain, DO

## 2024-10-29 NOTE — Plan of Care (Signed)
   Problem: Education: Goal: Knowledge of [facility] Education information/materials will improve Outcome: Progressing Goal: Emotional status will improve Outcome: Progressing Goal: Mental status will improve Outcome: Progressing Goal: Verbalization of understanding the information provided will improve Outcome: Progressing

## 2024-10-29 NOTE — Progress Notes (Signed)
 Pt is resting in bed calmly with sitter in the room with her.

## 2024-10-30 ENCOUNTER — Encounter (HOSPITAL_COMMUNITY): Payer: Self-pay

## 2024-10-30 DIAGNOSIS — F251 Schizoaffective disorder, depressive type: Secondary | ICD-10-CM | POA: Diagnosis not present

## 2024-10-30 DIAGNOSIS — F0392 Unspecified dementia, unspecified severity, with psychotic disturbance: Secondary | ICD-10-CM | POA: Diagnosis not present

## 2024-10-30 DIAGNOSIS — F489 Nonpsychotic mental disorder, unspecified: Secondary | ICD-10-CM

## 2024-10-30 DIAGNOSIS — G8929 Other chronic pain: Secondary | ICD-10-CM

## 2024-10-30 DIAGNOSIS — F259 Schizoaffective disorder, unspecified: Secondary | ICD-10-CM | POA: Diagnosis not present

## 2024-10-30 DIAGNOSIS — G3183 Dementia with Lewy bodies: Secondary | ICD-10-CM | POA: Diagnosis not present

## 2024-10-30 DIAGNOSIS — F0280 Dementia in other diseases classified elsewhere without behavioral disturbance: Secondary | ICD-10-CM | POA: Diagnosis not present

## 2024-10-30 LAB — GLUCOSE, CAPILLARY
Glucose-Capillary: 118 mg/dL — ABNORMAL HIGH (ref 70–99)
Glucose-Capillary: 197 mg/dL — ABNORMAL HIGH (ref 70–99)
Glucose-Capillary: 149 mg/dL — ABNORMAL HIGH (ref 70–99)

## 2024-10-30 MED ORDER — ROSUVASTATIN CALCIUM 20 MG PO TABS
20.0000 mg | ORAL_TABLET | Freq: Every day | ORAL | Status: DC
  Administered 2024-10-31 – 2024-11-10 (×11): 20 mg via ORAL
  Filled 2024-10-30 (×8): qty 1

## 2024-10-30 MED ORDER — MELATONIN 5 MG PO TABS
10.0000 mg | ORAL_TABLET | Freq: Every day | ORAL | Status: DC
  Administered 2024-10-30: 10 mg via ORAL
  Filled 2024-10-30: qty 2

## 2024-10-30 MED ORDER — BUSPIRONE HCL 10 MG PO TABS
10.0000 mg | ORAL_TABLET | Freq: Three times a day (TID) | ORAL | Status: DC
  Administered 2024-10-30 – 2024-10-31 (×3): 10 mg via ORAL
  Filled 2024-10-30 (×2): qty 1
  Filled 2024-10-30: qty 2

## 2024-10-30 NOTE — BHH Group Notes (Signed)
Patient did not attend the Wrap-up group. 

## 2024-10-30 NOTE — Group Note (Signed)
 Date:  11/04/2024 Time:  4:12 PM  Group Topic/Focus:  Emotional Education:   The focus of this group is to discuss what feelings/emotions are, and how they are experienced. Goals Group:   The focus of this group is to help patients establish daily goals to achieve during treatment and discuss how the patient can incorporate goal setting into their daily lives to aide in recovery.    Participation Level:  Did Not Attend   Alicia Mayo Alicia Mayo 11/04/2024, 4:12 PM

## 2024-10-30 NOTE — Progress Notes (Signed)
 [facility] Inpatient Psychiatry Progress Note  Date: 10/30/2024 Patient: Alicia Mayo MRN: [id number]  Assessment and Plan: Patient is a 58 year old female with a history of bipolar I disorder (though unable to identify any hospitalizations for this) and multiple prior strokes, presenting with visual and auditory hallucinations. She has had several recent medical hospitalizations for altered mental status, previously thought to be due to hypoactive delirium secondary to recurrent UTIs and medication polypharmacy. Possible etiologies for her current presentation include worsening of her primary psychiatric disorder, major neurocognitive disorder with psychotic features, and persistent low-level delirium. Her presentation appears predominantly neuropsychiatric in nature. Notably, her symptoms temporarily worsen following administration of Invega  Sustenna injections. If the patient has an underlying movement disorder such as Parkinsons disease, Lewy body dementia (LBD), or tardive dyskinesia (TD) this could explain her negative reaction to Invega  given its strong dopamine antagonism. Medication management goals include reducing polypharmacy and anticholinergic burden while avoiding strong dopamine antagonists.  Reducing polypharmacy burden may provide further diagnostic clarity.  The patient received her most recent Invega  Sustenna injection earlier this week, therefore, serum levels will need time to decrease. Strong dopamine-antagonists should be avoided in the future.    12/22: A&ox4 Thought process is concrete and childlike. Did not observe any hallucinations or delusional content, and 1:1 sitter does not report any behavioral dysregulation, hallucinations, delusions or confusion.  Continues to have psychomotor slowing, could experiencing sedating effects from her polypharmacy, though likely secondary to dopamine antagonism given 1:1 sitter reported patient worsening after medication  administration this morning.  The primary goal of this admission going forward is optimization of polypharmacy and further diagnostic clarity. Patient's mental status remains the same on reassessment, has not been fluctuant and remains alert and oriented x 4.  Concern for chronic delirium is lower on differential, and patient polypharmacy burden is not majoly concerning, though will continue to reduce pill burden in order to accurately assess patient mental status baseline.  Consider OT evaluation once patient at psychiatric baseline.  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 -- Consider a trial of carbidopa /levodopa  vs Major NeuroCognitive D/O (hx of 8 x strokes)  Labs - Follow ammonia, vitamin D    #Chronic pain / neuropathy -- Discontinued Lyrica  due to sedation on 10/29/2024 -- Continue Subutex  2 mg sublingual daily-continue titration as able due to potential of excessive sedation and delirium   #Asthma / Allergies -- Continue albuterol  inhaler as needed -- Continue Breo-Ellipta 1 puff daily -- Continue loratidine 10 mg daily, consider discontinuation, can be contributing to excess sedation   #HTN / HLD -- Continue losartan  100 mg daily -- Continue metoprolol  50 mg daily -- Increase Crestor  to 20 mg daily to optimize triglycerides   #T2DM -- Continue metformin  1000 mg BID --AC nightly POCT CBG monitoring - HbA1c 9.7    #GERD / IBS -- Continue Protonix  40 mg daily -- Continue Linzess  290 mcg daily   #Nicotine  withdrawal - Patient in need of nicotine   replacement; nicotine  patch 14 mg / 24 hours ordered. Smoking cessation encouraged  Risk Assessment -high, secondary to functional limitations  Discharge Planning Barriers to discharge: Functionality  Estimated length of stay: 5 to 7 days Predicted Discharge location: Back home to [facility], her caregiver  Interval History and update:  Chart reviewed, no events overnight, VSS. On assessment patient is found resting on her right side in bed, eyes closed but easily aroused. Attention is generally poor and patient seems to drift to sleep while answering questions though says she is still thinking, possible bradyphrenia.  Patient A&O x 4, though difficulty with open-ended questioning; [name] helps with 1:1 MHT reports no confusion, hallucinations, aggression, psychotic features or delusional content.  Denies any falls or major patient complaints.  MHT also reports patient appeared somewhat worse after her medications this morning. Patient denied SI, HI and AVH.  She did not appear to be responding to internal stimuli and there is no delusional content elicited.  Patient is eating and drinking adequately with good sleep.  Patient has no complaints.  Review of systems -Denies nausea, vomiting, diarrhea, abdominal pain, chest pain or shortness of breath.   Physical Exam MSK-weak requires aid of wheelchair or walker, no increased tone or rigidity Neuro-mildly sedated, alert and oriented to person, place, time and situation  Mental Status Exam Appearance -appears stated age and stiff with shut eyes Behavior-Global bradykinesia, poor eye contact Attitude - Calm, polite, not guarded Speech -increased latency, soft-spoken Mood -good and bad Affect -blunted, scrunched Thought Process -bradyphrenia, possible thought blocking or derailments Thought Content - No delusional content expressed SI/HI - Denies  Perceptions - Denies AVH; not RIS Attention -poor Cognition-poor Judgement/Insight  -impaired Fund of knowledge -poor Language -fair  Lab Results:  Admission on 10/28/2024  Component Date Value Ref Range Status   Hgb A1c MFr Bld 10/29/2024 9.7 (H)  4.8 - 5.6 % Final   Mean Plasma Glucose 10/29/2024 231.69  mg/dL Final   Cholesterol 87/78/7974 118  0 - 200 mg/dL Final   Triglycerides 87/78/7974 200 (H)  <150 mg/dL Final   HDL 87/78/7974 30 (L)  >40 mg/dL Final   Total CHOL/HDL Ratio 10/29/2024 3.9  RATIO Final   VLDL 10/29/2024 40  0 - 40 mg/dL Final   LDL Cholesterol 10/29/2024 48  0 - 99 mg/dL Final   Glucose-Capillary 10/29/2024 183 (H)  70 - 99 mg/dL Final   Glucose-Capillary 10/29/2024 126 (H)  70 - 99 mg/dL Final   Comment 1 87/78/7974 Notify RN   Final   Comment 2 10/29/2024 Document in Chart   Final   Glucose-Capillary 10/30/2024 118 (H)  70 - 99 mg/dL Final   Glucose-Capillary 10/30/2024 197 (H)  70 - 99 mg/dL Final    Vitals: Blood pressure (!) 128/91, pulse 71, temperature 98.1 F (36.7 C), temperature source Oral, resp. rate 16, height 5' 7 (1.702 m), weight 96.4 kg, SpO2 99%.   Alfornia Light, DO

## 2024-10-30 NOTE — Progress Notes (Signed)
 This MHT is on 1:1 relieving another MHT for lunch. This MHT called for an RN to assess pt as her verbal responses are delayed. RN came and assessed pt. This MHT helped pt sit more comfortably on the edge of the bed so she could drink orange juice but pt was observed leaning backwards slowly and closing her eyes. This MHT assisted the patient to lay down in the bed. Bed remains locked and in lowest position. Pt is resting with eyes closed with even, unlabored breaths. Q50min safety checks and 1:1 remain in place.SABRA

## 2024-10-30 NOTE — Group Note (Signed)
 Date:  10/30/2024 Time:  9:27 AM  Group Topic/Focus:  Emotional Education:   The focus of this group is to discuss what feelings/emotions are, and how they are experienced. Goals Group:   The focus of this group is to help patients establish daily goals to achieve during treatment and discuss how the patient can incorporate goal setting into their daily lives to aide in recovery.    Participation Level:  Active  Participation Quality:  Appropriate  Affect:  Appropriate  Cognitive:  Appropriate  Insight: Good  Engagement in Group:  Engaged  Modes of Intervention:  Discussion    Alicia Mayo Molly 10/30/2024, 9:27 AM

## 2024-10-30 NOTE — Plan of Care (Signed)
 Pt was out in the milieu during part of the day acting appropriately. Spent much of the day in bed with sitter present. Needs total care with feeding, clothing, transfers, and toileting.  Didn't go to the cafeteria but ate adequately in room. Attending group activity and participated. Denies SI/HI/SH/paranoia/AVH. Will continue to monitor.

## 2024-10-30 NOTE — Progress Notes (Signed)
 Pt has been awake. She ask where her night meds were and said she is having problems going to sleep because she is in pain all over Nps notified and pt received 3mg  of melatonin and fell asleep a little after that. Resting per bed with eyes closed, resp even and unlabored. 1:1 maintained.

## 2024-10-30 NOTE — B[facility] Group Notes (Signed)
 Patient attended the Grief and Loss group.

## 2024-10-30 NOTE — BHH Group Notes (Signed)
 Patient did not attend the Physical Wellness group.

## 2024-10-30 NOTE — Progress Notes (Signed)
 Pt noted without distress. Pt 1:1 continues. Assumed care of pt 2300.

## 2024-10-30 NOTE — Group Note (Signed)
 Date:  10/30/2024 Time:  9:57 PM  Group Topic/Focus:  Healthy Communication:   The focus of this group is to discuss communication, barriers to communication, as well as healthy ways to communicate with others.    Participation Level:  Did Not Attend   Alicia Mayo 10/30/2024, 9:57 PM

## 2024-10-30 NOTE — Progress Notes (Signed)
(  Sleep Hours) -6.5 as of 0530 (Any PRNs that were needed, meds refused, or side effects to meds)- one-time melatonin 3mg  @ 0050 (Any disturbances and when (visitation, over night)-none (Concerns raised by the patient)- none (SI/HI/AVH)- denies all

## 2024-10-30 NOTE — Progress Notes (Signed)
 Patient resting per bed, 1:1 maintained. Pt assisted to restroom without difficulty. Pt moves very slowly and is slow to respond to all questions. Will continue to monitor.

## 2024-10-30 NOTE — BH IP Treatment Plan (Signed)
 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, with psychotic disturbance (HCC)   Nonpsychotic mental disorder, unspecified   Current Medications:  Current Facility-Administered Medications  Medication Dose Route Frequency Provider Last Rate Last Admin   albuterol  (VENTOLIN  HFA) 108 (90 Base) MCG/ACT inhaler 2 puff  2 puff Inhalation Q4H PRN Bobbitt, Shalon E, NP       alum & mag hydroxide-simeth (MAALOX/MYLANTA) 200-200-20 MG/5ML suspension 30 mL  30 mL Oral Q4H PRN Bobbitt, Shalon E, NP       ammonium lactate  (AMLACTIN) 12 % cream 1 Application  1 Application Topical PRN Mannie Ashley SAILOR, MD       buprenorphine  (SUBUTEX ) SL tablet 2 mg  2 mg Sublingual Daily Chandra Charleston Christian Lee, DO   2 mg at 10/30/24 9140   busPIRone  (BUSPAR ) tablet 10 mg  10 mg Oral TID Faunce, Alina, DO   10 mg at 10/30/24 1702   escitalopram  (LEXAPRO ) tablet 20 mg  20 mg Oral Daily Mannie Ashley SAILOR, MD   20 mg at 10/30/24 9140   fluticasone  furoate-vilanterol (BREO ELLIPTA ) 200-25 MCG/ACT 1 puff  1 puff Inhalation Daily Mannie Ashley SAILOR, MD   1 puff at 10/30/24 0901   linaclotide  (LINZESS ) capsule 290 mcg  290 mcg Oral Daily Mannie Ashley SAILOR, MD   290 mcg at 10/30/24 0857   loratadine  (CLARITIN ) tablet 10 mg  10 mg Oral Daily Stevens, Briana N, MD   10 mg at 10/30/24 9140   losartan  (COZAAR ) tablet 100 mg  100 mg Oral Daily Bobbitt, Shalon E, NP   100 mg at 10/30/24 0900   metFORMIN  (GLUCOPHAGE -XR) 24 hr tablet 1,000 mg  1,000 mg Oral BID Bobbitt, Shalon E, NP   1,000 mg at 10/30/24 1702   metoprolol  tartrate (LOPRESSOR ) tablet 50 mg  50 mg Oral BID Bobbitt, Shalon E, NP   50 mg at 10/30/24  1702   nicotine  (NICODERM CQ  - dosed in mg/24 hours) patch 14 mg  14 mg Transdermal Daily Pashayan, Alexander S, DO   14 mg at 10/30/24 0900   pantoprazole  (PROTONIX ) EC tablet 40 mg  40 mg Oral Daily Stevens, Briana N, MD   40 mg at 10/30/24 0859   [START ON 10/31/2024] rosuvastatin  (CRESTOR ) tablet 20 mg  20 mg Oral Daily Faunce, Alina, DO       PTA Medications: Medications Prior to Admission  Medication Sig Dispense Refill Last Dose/Taking   albuterol  (VENTOLIN  HFA) 108 (90 Base) MCG/ACT inhaler Inhale 2 puffs into the lungs every 4 (four) hours as needed.      ammonium lactate  (AMLACTIN) 12 % cream Apply 1 Application topically 2 (two) times daily as needed for dry skin (apply to affected areas).      benztropine  (COGENTIN ) 1 MG tablet Take 0.5 tablets (0.5 mg total) by mouth 2 (two) times daily as needed for tremors.      buprenorphine  (SUBUTEX ) 2 MG SUBL SL tablet Place 2 mg under the tongue in the morning, at noon, and at bedtime.      busPIRone  (BUSPAR ) 15 MG tablet Take 15 mg by mouth 3 (three) times daily.      escitalopram  (LEXAPRO ) 20 MG tablet Take 20 mg by mouth daily.  fexofenadine (ALLEGRA) 180 MG tablet Take 180 mg by mouth daily.      fluticasone  (FLONASE ) 50 MCG/ACT nasal spray Place 2 sprays into the nose daily.      fluticasone -salmeterol (ADVAIR) 250-50 MCG/ACT AEPB Inhale 1 puff into the lungs 2 (two) times daily.      furosemide  (LASIX ) 20 MG tablet Take 20 mg by mouth daily.      INVEGA  SUSTENNA 234 MG/1.5ML injection Inject 234 mg into the muscle once.      lactulose  (CHRONULAC ) 10 GM/15ML solution Take 45 mLs (30 g total) by mouth daily as needed for moderate constipation or severe constipation. 236 mL 0    lidocaine  (LIDODERM ) 5 % Place 1 patch onto the skin daily.      LINZESS  290 MCG CAPS capsule Take 290 mcg by mouth daily.      losartan  (COZAAR ) 100 MG tablet Take 100 mg by mouth daily.      Lurasidone  HCl 120 MG TABS Take 1 tablet by mouth daily.       metFORMIN  (GLUCOPHAGE -XR) 500 MG 24 hr tablet Take 1,000 mg by mouth 2 (two) times daily.      metoprolol  tartrate (LOPRESSOR ) 50 MG tablet Take 50 mg by mouth 2 (two) times daily.      naloxone  (NARCAN ) nasal spray 4 mg/0.1 mL Place 1 spray into the nose once.      paliperidone  (INVEGA ) 3 MG 24 hr tablet Take 2 tablets (6 mg total) by mouth at bedtime. 30 tablet 0    pantoprazole  (PROTONIX ) 40 MG tablet Take 40 mg by mouth daily.      polyethylene glycol (MIRALAX  / GLYCOLAX ) 17 g packet Take 17 g by mouth daily as needed.      pregabalin  (LYRICA ) 150 MG capsule Take 1 capsule (150 mg total) by mouth 2 (two) times daily.      rosuvastatin  (CRESTOR ) 5 MG tablet Take 5 mg by mouth daily.      trospium (SANCTURA) 20 MG tablet Take 20 mg by mouth 2 (two) times daily.       Patient Stressors: Financial difficulties   Health problems    Patient Strengths: Ability for insight  Active sense of humor  Motivation for treatment/growth   Treatment Modalities: Medication Management, Group therapy, Case management,  1 to 1 session with clinician, Psychoeducation, Recreational therapy.   Physician Treatment Plan for Primary Diagnosis: Schizoaffective disorder (HCC) Long Term Goal(s):     Short Term Goals: Ability to maintain clinical measurements within normal limits will improve  Medication Management: Evaluate patient's response, side effects, and tolerance of medication regimen.  Therapeutic Interventions: 1 to 1 sessions, Unit Group sessions and Medication administration.  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  Therapeutic Interventions: 1 to 1 sessions, Unit Group sessions and Medication administration.  Evaluation of Outcomes: Not Progressing   RN Treatment Plan for Primary Diagnosis: Schizoaffective disorder (HCC) Long Term Goal(s): Knowledge of disease and therapeutic regimen to maintain health will improve  Short Term Goals: Ability to remain free from injury  will improve, Ability to verbalize frustration and anger appropriately will improve, Ability to demonstrate self-control, Ability to participate in decision making will improve, Ability to verbalize feelings will improve, Ability to disclose and discuss suicidal ideas, Ability to identify and develop effective coping behaviors will improve, and Compliance with prescribed medications will improve  Medication Management: RN will administer medications as ordered by provider, will assess and evaluate patient's response and provide education to patient for prescribed medication. RN will report any adverse and/or side effects to prescribing provider.  Therapeutic Interventions: 1 on 1 counseling sessions, Psychoeducation, Medication administration, Evaluate responses to treatment, Monitor vital signs and CBGs as ordered, Perform/monitor CIWA, COWS, AIMS and Fall Risk screenings as ordered, Perform wound care treatments as ordered.  Evaluation of Outcomes: Not Progressing   LCSW Treatment Plan for Primary Diagnosis: Schizoaffective disorder (HCC) Long Term Goal(s): Safe transition to appropriate next level of care at discharge, Engage patient in therapeutic group addressing interpersonal concerns.  Short Term Goals: Engage patient in aftercare planning with referrals and resources, Increase social support, Increase ability to appropriately verbalize feelings, Increase emotional regulation, Facilitate  acceptance of mental health diagnosis and concerns, Facilitate patient progression through stages of change regarding substance use diagnoses and concerns, Identify triggers associated with mental health/substance abuse issues, and Increase skills for wellness and recovery  Therapeutic Interventions: Assess for all discharge needs, 1 to 1 time with Social worker, Explore available resources and support systems, Assess for adequacy in community support network, Educate family and significant other(s) on suicide prevention, Complete Psychosocial Assessment, Interpersonal group therapy.  Evaluation of Outcomes: Not Progressing   Progress in Treatment: Attending groups: attended some groups Participating in groups: Yes. Taking medication as prescribed: Yes. Toleration medication: Yes. Family/Significant other contact made: No, will contact:  [name] (friend/caregiver) [phone] Patient understands diagnosis: Yes. Discussing patient identified problems/goals with staff: Yes. Medical problems stabilized or resolved: Yes. Denies suicidal/homicidal ideation: Yes. Issues/concerns per patient self-inventory: No.  New problem(s) identified:  No  New Short Term/Long Term Goal(s):    medication stabilization, elimination of SI thoughts, development of comprehensive mental wellness plan.    Patient Goals:  I would like to review and adjust my medications to help with hallucinations.  Discharge Plan or Barriers:  Patient recently admitted. CSW will continue to follow and assess for appropriate referrals and possible discharge planning.    Reason for Continuation of Hospitalization: Hallucinations Medication stabilization  Estimated Length of Stay:  5 - 7 days  Last 3 [place] Suicide Severity Risk Score: Flowsheet Row [facility] (Current[facility] from 10/27/2024 in [facility] ED to  Hosp-Admission (Discharged) from 08/19/2024 in [facility] ([facility])  C-SSRS RISK CATEGORY No Risk No Risk No Risk    Last PHQ 2/9 Scores:     No data to display          Scribe for Treatment Team: Mushka Laconte O Shonita Rinck, LCSWA 10/30/2024 7:19 PM

## 2024-10-30 NOTE — Group Note (Signed)
 Recreation Therapy Group Note   Group Topic:Communication  Group Date: 10/30/2024 Start Time: 9065 End Time: 1010 Facilitators: Tiersa Dayley-McCall, LRT,CTRS Location: [address] Dayroom   Group Topic: Communication, Problem Solving   Goal Area(s) Addresses:  Patient will effectively listen to complete activity.  Patient will identify communication skills used to make activity successful.  Patient will identify how skills used during activity can be used to reach post d/c goals.    Behavioral Response: Engaged   Intervention: Building Surveyor Activity - Geometric pattern cards, pencils, blank paper    Activity: Geometric Drawings.  Three volunteers from the peer group will be shown an abstract picture with a particular arrangement of geometrical shapes.  Each round, one 'speaker' will describe the pattern, as accurately as possible without revealing the image to the group.  The remaining group members will listen and draw the picture to reflect how it is described to them. Patients with the role of 'listener' cannot ask clarifying questions but, may request that the speaker repeat a direction. Once the drawings are complete, the presenter will show the rest of the group the picture and compare how close each person came to drawing the picture. LRT will facilitate a post-activity discussion regarding effective communication and the importance of planning, listening, and asking for clarification in daily interactions with others.  Education: Environmental consultant, Active listening, Support systems, Discharge planning  Education Outcome: Acknowledges understanding/In group clarification offered/Needs additional education.    Affect/Mood: Appropriate   Participation Level: Engaged   Participation Quality: Independent   Behavior: Appropriate   Speech/Thought Process: Barely audible    Insight: Fair   Judgement: Fair    Modes of Intervention: Activity   Patient Response  to Interventions:  Engaged   Education Outcome:  In group clarification offered    Clinical Observations/Individualized Feedback: Pt was quiet but engaged in the activity. Pt was focused a attentive throughout group.     Plan: Continue to engage patient in RT group sessions 2-3x/week.   Kaylon Laroche-McCall, LRT,CTRS 10/30/2024 1:00 PM

## 2024-10-31 LAB — GLUCOSE, CAPILLARY
Glucose-Capillary: 112 mg/dL — ABNORMAL HIGH (ref 70–99)
Glucose-Capillary: 117 mg/dL — ABNORMAL HIGH (ref 70–99)
Glucose-Capillary: 112 mg/dL — ABNORMAL HIGH (ref 70–99)
Glucose-Capillary: 115 mg/dL — ABNORMAL HIGH (ref 70–99)

## 2024-10-31 LAB — CALCITRIOL (1,25 DI-OH VIT D): Vit D, 1,25-Dihydroxy: 15.5 pg/mL — ABNORMAL LOW (ref 24.8–81.5)

## 2024-10-31 LAB — CK: Total CK: 395 U/L — ABNORMAL HIGH (ref 38–234)

## 2024-10-31 MED ORDER — LORATADINE 10 MG PO TABS
10.0000 mg | ORAL_TABLET | Freq: Every day | ORAL | Status: DC
  Administered 2024-11-01 – 2024-11-10 (×10): 10 mg via ORAL
  Filled 2024-10-31 (×7): qty 1

## 2024-10-31 MED ORDER — VITAMIN D (ERGOCALCIFEROL) 1.25 MG (50000 UNIT) PO CAPS
50000.0000 [IU] | ORAL_CAPSULE | ORAL | Status: DC
  Administered 2024-11-01 – 2024-11-08 (×2): 50000 [IU] via ORAL
  Filled 2024-10-31: qty 1

## 2024-10-31 MED ORDER — BUSPIRONE HCL 7.5 MG PO TABS
7.5000 mg | ORAL_TABLET | Freq: Two times a day (BID) | ORAL | Status: DC
  Administered 2024-11-01: 7.5 mg via ORAL
  Filled 2024-10-31: qty 1

## 2024-10-31 MED ORDER — BUPRENORPHINE HCL 2 MG SL SUBL
1.0000 mg | SUBLINGUAL_TABLET | Freq: Every day | SUBLINGUAL | Status: DC
  Administered 2024-11-01 – 2024-11-07 (×7): 1 mg via SUBLINGUAL
  Filled 2024-10-31 (×6): qty 1

## 2024-10-31 MED ORDER — ESCITALOPRAM OXALATE 10 MG PO TABS
20.0000 mg | ORAL_TABLET | Freq: Every day | ORAL | Status: DC
  Administered 2024-11-01: 20 mg via ORAL
  Filled 2024-10-31: qty 2

## 2024-10-31 MED ORDER — LINACLOTIDE 145 MCG PO CAPS
290.0000 ug | ORAL_CAPSULE | Freq: Every day | ORAL | Status: DC
  Administered 2024-11-01 – 2024-11-10 (×10): 290 ug via ORAL
  Filled 2024-10-31 (×7): qty 2

## 2024-10-31 MED ORDER — MELATONIN 5 MG PO TABS
5.0000 mg | ORAL_TABLET | Freq: Every day | ORAL | Status: DC
  Administered 2024-10-31 – 2024-11-09 (×10): 5 mg via ORAL
  Filled 2024-10-31 (×7): qty 1

## 2024-10-31 NOTE — Progress Notes (Addendum)
(  Sleep Hours) - 7 (Any PRNs that were needed, meds refused, or side effects to meds)- none, new melatonin order (Any disturbances and when (visitation, over night)- 1:1 Re-ordered (Concerns raised by the patient)- none (SI/HI/AVH)- denies all

## 2024-10-31 NOTE — [facility] Group Notes (Signed)
 Patient did not attend the Goals and Orientation group.

## 2024-10-31 NOTE — Group Note (Signed)
 LCSW Group Therapy Note  Group Date: 10/31/2024 Start Time: 1100 End Time: 1200   Type of Therapy and Topic:  Group Therapy - Healthy vs Unhealthy Coping Skills  Participation Level:  Did Not Attend   Description of Group The focus of this group was to determine what unhealthy coping techniques typically are used by group members and what healthy coping techniques would be helpful in coping with various problems. Patients were guided in becoming aware of the differences between healthy and unhealthy coping techniques. Patients were asked to identify 2-3 healthy coping skills they would like to learn to use more effectively.  Therapeutic Goals Patients learned that coping is what human beings do all day long to deal with various situations in their lives Patients defined and discussed healthy vs unhealthy coping techniques Patients identified their preferred coping techniques and identified whether these were healthy or unhealthy Patients determined 2-3 healthy coping skills they would like to become more familiar with and use more often. Patients provided support and ideas to each other   Summary of Patient Progress:  Pt was invited but did not attend  Therapeutic Modalities Cognitive Behavioral Therapy Motivational Interviewing  Golda Louder, LCSWA 10/31/2024  12:56 PM

## 2024-10-31 NOTE — Progress Notes (Signed)
 1:1 NOTE  Patient continued on 1:1 for safety is ambulating on wheel chair with total assistance from Staff. Patient remains safe. Support ongoing. Denies SI/HI/A/VH and contracts for safety.

## 2024-10-31 NOTE — BHH Group Notes (Signed)
Patient did not attend the Wrap-up group. 

## 2024-10-31 NOTE — [facility] Suicide Risk Assessment (Signed)
 [facility] INPATIENT:  Family/Significant Other Suicide Prevention Education  Suicide Prevention Education:  Education Completed; [name] (caregiver) [phone],  (name of family member/significant other) has been identified by the patient as the family member/significant other with whom the patient will be residing, and identified as the person(s) who will aid the patient in the event of a mental health crisis (suicidal ideations/suicide attempt).  With written consent from the patient, the family member/significant other has been provided the following suicide prevention education, prior to the and/or following the discharge of the patient.  No access to weapons or firearms, states the house is secured and she has 2 caregivers. Pt does have a psychiatrist in [place] she sees for injection, will call back with that providers information.   The suicide prevention education provided includes the following: Suicide risk factors Suicide prevention and interventions National Suicide Hotline telephone number [facility] assessment telephone number [facility] Emergency Assistance 911 [facility] and/or Residential Mobile Crisis Unit telephone number  Request made of family/significant other to: Remove weapons (e.g., guns, rifles, knives), all items previously/currently identified as safety concern.   Remove drugs/medications (over-the-counter, prescriptions, illicit drugs), all items previously/currently identified as a safety concern.  The family member/significant other verbalizes understanding of the suicide prevention education information provided.  The family member/significant other agrees to remove the items of safety concern listed above.  Jenkins LULLA Primer 10/31/2024, 11:48 AM

## 2024-10-31 NOTE — [facility] Group Notes (Signed)
 Pihu did not attend wrap up group

## 2024-10-31 NOTE — Group Note (Signed)
 Date:  10/31/2024 Time:  4:37 PM  Group Topic/Focus:  Wellness Toolbox:   The focus of this group is to discuss various aspects of wellness, balancing those aspects and exploring ways to increase the ability to experience wellness.  Patients will create a wellness toolbox for use upon discharge.    Participation Level:  Did Not Attend   Almarie MALVA Lowers 10/31/2024, 4:37 PM

## 2024-10-31 NOTE — Progress Notes (Signed)
 1:1 NOTE  Patient continued on 1:1 Observation has some confusion but is very pleasant and requires total care for all her ADLS. 1: 1 Staff gave Patient a shower. Patient is incontinent of bladder. Denies SI/HI/A/VH and contracts for safety. Reoriented Patient to situation. Support ongoing,.

## 2024-10-31 NOTE — [facility] Group Notes (Signed)
 Patient did not attend the Social Work group.

## 2024-10-31 NOTE — Plan of Care (Signed)
   Problem: Education: Goal: Emotional status will improve Outcome: Progressing Goal: Mental status will improve Outcome: Progressing Goal: Verbalization of understanding the information provided will improve Outcome: Progressing

## 2024-10-31 NOTE — Progress Notes (Addendum)
Pt noted without distress. Pt 1:1 continues 

## 2024-10-31 NOTE — Group Note (Signed)
 Recreation Therapy Group Note   Group Topic:Animal Assisted Therapy   Group Date: 10/31/2024 Start Time: 0945 End Time: 1030 Facilitators: Lauraine Crespo-McCall, LRT,CTRS Location: [address] Dayroom   Animal-Assisted Activity (AAA) Program Checklist/Progress Notes Patient Eligibility Criteria Checklist & Daily Group note for Rec Tx Intervention  AAA/T Program Assumption of Risk Form signed by Patient/ or Parent Legal Guardian Yes  Patient understands his/her participation is voluntary Yes  Behavioral Response:    Education: Charity Fundraiser, Appropriate Animal Interaction   Education Outcome: Acknowledges education.    Affect/Mood: N/A   Participation Level: Did not attend    Clinical Observations/Individualized Feedback:      Plan: Continue to engage patient in RT group sessions 2-3x/week.   Baylin Cabal-McCall, LRT,CTRS 10/31/2024 12:58 PM

## 2024-10-31 NOTE — Progress Notes (Signed)
 [facility] Inpatient Psychiatry Progress Note  Date: 10/31/2024 Patient: Alicia Mayo MRN: [id number]  Assessment and Plan: Patient is a 58 year old female with a history of bipolar I disorder (though unable to identify any hospitalizations for this) and multiple prior strokes, presenting with visual and auditory hallucinations. She has had several recent medical hospitalizations for altered mental status, previously thought to be due to hypoactive delirium secondary to recurrent UTIs and medication polypharmacy. Possible etiologies for her current presentation include worsening of her primary psychiatric disorder, major neurocognitive disorder with psychotic features, and persistent low-level delirium. Her presentation appears predominantly neuropsychiatric in nature. Notably, her symptoms temporarily worsen following administration of Invega  Sustenna injections. If the patient has an underlying movement disorder such as Parkinsons disease, or Lewy body dementia (LBD) could explain her negative reaction to Invega  given its strong dopamine antagonism. Medication management goals include reducing polypharmacy and anticholinergic burden while avoiding strong dopamine antagonists.  Reducing polypharmacy burden may provide further diagnostic clarity.  The patient received her most recent Invega  Sustenna injection earlier this week, therefore, serum levels will need time to decrease. Strong dopamine-antagonists should be avoided in the future.    12/23: Did not observe any hallucinations or delusional content, and 1:1 sitter does not report any behavioral dysregulation, hallucinations, delusions or confusion, the patient endorses visual hallucinations of cats.  Continues to have psychomotor slowing, could be experiencing sedating effects from polypharmacy, though likely secondary to ongoing dopamine antagonism from previous dose antipsychotics.  Patient was talkative in the day room before  medication administration this morning, then became very sedated and did not improve throughout the day. Will continue to decrease dose and frequency of patient's medications, and adjust start time tomorrow to the afternoon, to monitor patient's behaviors without medication administration.  Concern for chronic delirium is lower on differential, and patient polypharmacy burden is not majoly concerning, though will continue to reduce pill burden in order to accurately assess patient mental status baseline. The primary goal of this admission going forward is optimization of polypharmacy and further diagnostic clarity.  Consider OT evaluation once patient at psychiatric baseline.  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 -- Consider a trial of carbidopa /levodopa  vs Major NeuroCognitive D/O (hx of 8 x strokes)  Labs - Follow ammonia, vitamin D  15.5  #Chronic pain / neuropathy -- Discontinued Lyrica  due to sedation on 10/29/2024 -- decrease Subutex  to 1 mg sublingual daily-continue titration as able due to potential of excessive sedation and delirium   #Asthma / Allergies -- Continue albuterol  inhaler as needed -- Continue Breo-Ellipta 1 puff daily -- Continue loratidine 10 mg daily, consider discontinuation, can be contributing to excess sedation   #HTN / HLD -- Continue losartan  100 mg daily -- Continue metoprolol  50 mg daily -- continue Crestor  20 mg daily to optimize triglycerides  #low vit D - Start ergocalciferol  50K units q 7 days   #T2DM -- Continue metformin  1000 mg BID --AC  nightly POCT CBG monitoring - HbA1c 9.7    #GERD / IBS -- Continue Protonix  40 mg daily -- Continue Linzess  290 mcg daily   #Nicotine  withdrawal - Patient in need  of nicotine  replacement; nicotine  patch 14 mg / 24 hours ordered. Smoking cessation encouraged  Risk Assessment -high, secondary to functional limitations  Discharge Planning Barriers to discharge: Functionality Estimated length of stay: 5 to 7 days Predicted Discharge location: Back home to [facility], her caregiver  Interval History and update:  Chart reviewed, no events overnight, VSS. On assessment patient is somewhat sedated but able to have conversation.  Patient says that she has some difficulty eating bacon without her teeth, though enjoys the cheesy grits for breakfast.patient says she would like for her caretaker [name] to bring more depends, though patient was reassured that we would have plenty for her while she is here.  Patient complains of feeling tired, not getting any sleep, though she appears to be sleeping for most of the day.  Patient endorses ongoing visual hallucinations, has been seeing cats in the hospital.  Denies any falls or major patient complaints. Patient denied SI, HI and AVH.  She did not appear to be responding to internal stimuli and there is no delusional content elicited.  Patient was subsequently reevaluated in the afternoon to monitor for any improvement with psychomotor slowing, though status was about the same as initial assessment today.  Patient is drinking adequately though has poor appetite with okay sleep.  Patient has no complaints.   Physical Exam MSK-weak requires aid of wheelchair or walker, some ratcheting in RUE Neuro-mildly sedated, alert and oriented to person, place, time and situation  Mental Status Exam Appearance -appears stated age and stiff with shut eyes Behavior-Global bradykinesia, poor eye contact Attitude - Calm, polite, not guarded Speech -increased latency,  soft-spoken Mood - tired Affect -blunted, scrunched Thought Process -bradyphrenia, possible thought blocking or derailments Thought Content - No delusional content expressed SI/HI - Denies  Perceptions -endorses VH of cats Attention -poor Cognition-poor Judgement/Insight -impaired Fund of knowledge -poor Language -fair  Lab Results:  Admission on 10/28/2024  Component Date Value Ref Range Status   Hgb A1c MFr Bld 10/29/2024 9.7 (H)  4.8 - 5.6 % Final   Mean Plasma Glucose 10/29/2024 231.69  mg/dL Final   Cholesterol 87/78/7974 118  0 - 200 mg/dL Final   Triglycerides 87/78/7974 200 (H)  <150 mg/dL Final   HDL 87/78/7974 30 (L)  >40 mg/dL Final   Total CHOL/HDL Ratio 10/29/2024 3.9  RATIO Final   VLDL 10/29/2024 40  0 - 40 mg/dL Final   LDL Cholesterol 10/29/2024 48  0 - 99 mg/dL Final   Glucose-Capillary 10/29/2024 183 (H)  70 - 99 mg/dL Final   Glucose-Capillary 10/29/2024 126 (H)  70 - 99 mg/dL Final   Comment 1 87/78/7974 Notify RN   Final   Comment 2 10/29/2024 Document in Chart   Final   Glucose-Capillary 10/30/2024 118 (H)  70 - 99 mg/dL Final   Glucose-Capillary 10/30/2024 197 (H)  70 - 99 mg/dL Final   Glucose-Capillary 10/30/2024 149 (H)  70 - 99 mg/dL Final   Glucose-Capillary 10/31/2024 112 (H)  70 - 99 mg/dL Final   Comment 1 87/76/7974 Notify RN   Final   Comment 2 10/31/2024 Document in Chart   Final    Vitals: Blood pressure (!) 151/77, pulse 72, temperature 98.4 F (36.9 C), temperature source Oral, resp. rate 20, height 5' 7 (1.702 m), weight 96.4 kg, SpO2 99%.   Alfornia Light, DO

## 2024-11-01 LAB — GLUCOSE, CAPILLARY
Glucose-Capillary: 126 mg/dL — ABNORMAL HIGH (ref 70–99)
Glucose-Capillary: 171 mg/dL — ABNORMAL HIGH (ref 70–99)
Glucose-Capillary: 125 mg/dL — ABNORMAL HIGH (ref 70–99)

## 2024-11-01 MED ORDER — BUSPIRONE HCL 5 MG PO TABS
5.0000 mg | ORAL_TABLET | Freq: Two times a day (BID) | ORAL | Status: DC
  Administered 2024-11-02 – 2024-11-10 (×17): 5 mg via ORAL
  Filled 2024-11-01 (×11): qty 1

## 2024-11-01 MED ORDER — MIRTAZAPINE 7.5 MG PO TABS: 7.5000 mg | ORAL_TABLET | Freq: Every day | ORAL | Status: DC

## 2024-11-01 MED ORDER — CARBIDOPA-LEVODOPA ER 25-100 MG PO TBCR
1.0000 | EXTENDED_RELEASE_TABLET | Freq: Two times a day (BID) | ORAL | Status: DC
  Administered 2024-11-01: 1 via ORAL
  Filled 2024-11-01 (×3): qty 1

## 2024-11-01 MED ORDER — CARBIDOPA-LEVODOPA 25-100 MG PO TABS
2.0000 | ORAL_TABLET | Freq: Three times a day (TID) | ORAL | Status: DC
  Administered 2024-11-02 – 2024-11-03 (×4): 2 via ORAL
  Filled 2024-11-01 (×9): qty 2

## 2024-11-01 MED ORDER — GLUCERNA SHAKE PO LIQD
237.0000 mL | Freq: Three times a day (TID) | ORAL | Status: DC
  Administered 2024-11-01 – 2024-11-09 (×24): 237 mL via ORAL
  Filled 2024-11-01 (×17): qty 237

## 2024-11-01 NOTE — Group Note (Signed)
 Recreation Therapy Group Note   Group Topic:Problem Solving  Group Date: 11/01/2024 Start Time: 0935 End Time: 1010 Facilitators: Nakia Remmers-McCall, LRT,CTRS Location: [address] Dayroom   Group Topic: Communication, Team Building, Problem Solving  Goal Area(s) Addresses:  Patient will effectively work with peer towards shared goal.  Patient will identify skills used to make activity successful.  Patient will share challenges and verbalize solution-driven approaches used. Patient will identify how skills used during activity can be used to reach post d/c goals.   Behavioral Response: Minimal  Intervention: STEM Activity   Activity: [employer]. Patients were provided the following materials: 5 drinking straws, 5 rubber bands, 5 paper clips, 2 index cards and 2 drinking cups. Using the provided materials patients were asked to build a launching mechanism to launch a ping pong ball across the room, approximately 10 feet. Patients were divided into teams of 3-5. Instructions required all materials be incorporated into the device, functionality of items left to the peer group's discretion.  Education: Pharmacist, Community, Scientist, Physiological, Air Cabin Crew, Building Control Surveyor.   Education Outcome: Acknowledges education/In group clarification offered/Needs additional education.    Affect/Mood: Appropriate   Participation Level: Minimal   Participation Quality: Independent   Behavior: Attentive    Speech/Thought Process: Relevant   Insight: Fair   Judgement: Fair    Modes of Intervention: STEM Activity   Patient Response to Interventions:  Attentive   Education Outcome:  In group clarification offered    Clinical Observations/Individualized Feedback: Pt attended and was attentive as peers engaged on how to put launcher together. Pt made a few comments but mostly watched.      Plan: Continue to engage patient in RT group sessions 2-3x/week.   Shadae Reino-McCall,  LRT,CTRS 11/01/2024 12:56 PM

## 2024-11-01 NOTE — Progress Notes (Signed)
 1:1 Note  Patient alert and oriented having difficult expressing herself. Voided x 1 with BM not able to collect urine. Eating less than 25% of her meals this shift. Compliant with Glucerna total fluids intake 1432 ml. Denies SI/HI/A/VH. 1:1  observation ongoing and Patient remains safe. Support ongoing.

## 2024-11-01 NOTE — Progress Notes (Signed)
(  Sleep Hours) -6.25 (Any PRNs that were needed, meds refused, or side effects to meds)- none (Any disturbances and when (visitation, over night)-none (Concerns raised by the patient)- none (SI/HI/AVH)-denied

## 2024-11-01 NOTE — Progress Notes (Signed)
 Patient continued on 1:1 attending programming this shift compliant with medications no adverse effects noted. Po Fluids encouraged. Patient with some confusion but pleasant. Support ongoing.

## 2024-11-01 NOTE — Progress Notes (Addendum)
 1:1 Nursing note:  Pt is laying in bed . Respirations are even and unlabored. 1:1 continued for pt safety. Safety maintained.Pt assisted  with ADL's and reposition

## 2024-11-01 NOTE — Plan of Care (Signed)
  Problem: Education: Goal: Emotional status will improve Outcome: Progressing Goal: Mental status will improve Outcome: Progressing   Problem: Coping: Goal: Ability to demonstrate self-control will improve Outcome: Progressing   Problem: Health Behavior/Discharge Planning: Goal: Compliance with treatment plan for underlying cause of condition will improve Outcome: Progressing   

## 2024-11-01 NOTE — Progress Notes (Signed)
 [facility] Inpatient Psychiatry Progress Note  Date: 11/01/2024 Patient: Alicia Mayo MRN: [id number]  Assessment and Plan: Patient is a 58 year old female with a history of bipolar I disorder (though unable to identify any hospitalizations for this) and multiple prior strokes, presenting with visual and auditory hallucinations. She has had several recent medical hospitalizations for altered mental status, previously thought to be due to hypoactive delirium secondary to recurrent UTIs and medication polypharmacy. Possible etiologies for her current presentation include worsening of her primary psychiatric disorder, major neurocognitive disorder with psychotic features, and persistent low-level delirium. Her presentation appears predominantly neuropsychiatric in nature. Notably, her symptoms temporarily worsen following administration of Invega  Sustenna injections.  This patient's presentation of visual hallucinations, parkinsonian features and antipsychotic sensitivity align mostly due to dementia with Lewy bodies (DLB).  Patient's occasional presentations of akinesia potentially concerning for catatonia, though patient's minimal activity fluctuates throughout the day and does not rigid posturing, and Ativan challenge at this time could potentially worsen presentation.  12/24: Did not observe any hallucinations or delusional content, and 1:1 sitter does not report any behavioral dysregulation, hallucinations, delusions or confusion.  Continues to have psychomotor slowing, will increase carbidopa  levodopa  and continue to monitor response. Will continue to decrease dose and frequency of patient's medications.  Could consider mirtazapine  at bedtime for appetite stimulation, though will hold for now to best monitor patient response to levodopa .  Patient CK mildly elevated and warrants monitoring I's and O's and encouraging food intake, could be secondary to residual antipsychotic or  statins.  UA ordered, nursing reported foul-smelling urine, VSS and reassuring. OT evaluation once patient at psychiatric baseline.  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 -- increase carbidopa /levodopa  TID, 1x dose today - monitor Is and Os - Follow-up UA  Labs - Follow ammonia, vitamin D  15.5, CK 395  #Chronic pain / neuropathy -- Discontinued Lyrica  due to sedation on 10/29/2024 -- continue Subutex  1 mg sublingual daily-continue titration as able due to potential of excessive sedation and delirium   #Asthma / Allergies -- Continue albuterol  inhaler as needed -- Continue Breo-Ellipta 1 puff daily -- Continue loratidine 10 mg daily, consider discontinuation, can be contributing to excess sedation   #HTN / HLD -- Continue losartan  100 mg daily -- Continue metoprolol  50 mg daily -- continue Crestor  20 mg daily to optimize triglycerides  #low vit D - continue ergocalciferol  50K units q 7 days   #T2DM -- Continue metformin  1000 mg BID --AC nightly POCT CBG monitoring - HbA1c 9.7    #GERD / IBS -- Continue Protonix  40 mg daily -- Continue Linzess  290 mcg daily   #Nicotine  withdrawal - Patient in need of nicotine  replacement; nicotine  patch 14 mg / 24 hours ordered. Smoking cessation encouraged  Risk Assessment -high, secondary to functional limitations  Discharge Planning Barriers to discharge: Functionality Estimated length of stay: 5 to 7 days Predicted Discharge location: Pending probable PT eval, or back home to [facility], her  caregiver  Interval History and update:  Chart reviewed, no events overnight, VSS.  Patient observed sitting up in day room enjoying movie with peers  in milieu.  Denies any falls or major patient complaints. Patient denied SI, HI and AVH.  She did not appear to be responding to internal stimuli and there is no delusional content elicited.  Reassuring given medications held this morning to see improvement with movements.  Patient was subsequently reevaluated in the afternoon to monitor for any improvement with psychomotor slowing following dose of levodopa , though patient drifting in and out of sleep.  Patient is eating 3-5 bites of food at each meal and reducing fluid intake.   Physical Exam MSK-weak requires aid of wheelchair or walker, some ratcheting in bilateral UE Neuro-mildly sedated, alert and oriented to person, place, time and situation  Mental Status Exam Appearance -older black woman with short curly hair sitting up in chair behavior-Global bradykinesia, poor eye contact Attitude - Calm, polite, not guarded Speech -increased latency, soft-spoken Mood -euthymic Affect -blunted Thought Process -bradyphrenia Thought Content - No delusional content expressed SI/HI - Denies  Perceptions -did not appear to be responding to internal stimuli Attention -poor Cognition-poor Judgement/Insight -impaired Fund of knowledge -poor Language -fair  Lab Results:  Admission on 10/28/2024  Component Date Value Ref Range Status   Hgb A1c MFr Bld 10/29/2024 9.7 (H)  4.8 - 5.6 % Final   Mean Plasma Glucose 10/29/2024 231.69  mg/dL Final   Cholesterol 87/78/7974 118  0 - 200 mg/dL Final   Triglycerides 87/78/7974 200 (H)  <150 mg/dL Final   HDL 87/78/7974 30 (L)  >40 mg/dL Final   Total CHOL/HDL Ratio 10/29/2024 3.9  RATIO Final   VLDL 10/29/2024 40  0 - 40 mg/dL Final   LDL Cholesterol 10/29/2024 48  0 - 99 mg/dL Final   Glucose-Capillary 10/29/2024 183 (H)  70 - 99 mg/dL Final    Glucose-Capillary 10/29/2024 126 (H)  70 - 99 mg/dL Final   Comment 1 87/78/7974 Notify RN   Final   Comment 2 10/29/2024 Document in Chart   Final   Vit D, 1,25-Dihydroxy 10/30/2024 15.5 (L)  24.8 - 81.5 pg/mL Final   Glucose-Capillary 10/30/2024 118 (H)  70 - 99 mg/dL Final   Glucose-Capillary 10/30/2024 197 (H)  70 - 99 mg/dL Final   Glucose-Capillary 10/30/2024 149 (H)  70 - 99 mg/dL Final   Glucose-Capillary 10/31/2024 112 (H)  70 - 99 mg/dL Final   Comment 1 87/76/7974 Notify RN   Final   Comment 2 10/31/2024 Document in Chart   Final   Glucose-Capillary 10/31/2024 117 (H)  70 - 99 mg/dL Final   Glucose-Capillary 10/31/2024 112 (H)  70 - 99 mg/dL Final   Total CK 87/76/7974 395 (H)  38 - 234 U/L Final   Glucose-Capillary 10/31/2024 115 (H)  70 - 99 mg/dL Final   Glucose-Capillary 11/01/2024 126 (H)  70 - 99 mg/dL Final    Vitals: Blood pressure 135/74, pulse 88, temperature 99.1 F (37.3 C), temperature source Oral, resp. rate 16, height 5' 7 (1.702 m), weight 96.4 kg, SpO2 95%.   Alfornia Light, DO

## 2024-11-01 NOTE — Progress Notes (Signed)
 1:1 Nursing note:  Pt is laying in bed . Respirations are even and unlabored.  1:1 continued for pt safety. Safety maintained.

## 2024-11-01 NOTE — BHH Group Notes (Signed)
 Spirituality Group   Description: Participant directed exploration of values, beliefs and meaning   **Group focused on self-compassion as additional supportive and healing method; locating our gifts   Following a brief framework of chaplains role and ground rules of group behavior, participants are invited to share concerns or questions that engage spiritual life. Emphasis placed on common themes and shared experiences and ways to make meaning and clarify living into ones values.   Theory/Process/Goal: Utilize the theoretical framework of group therapy established by Celena Kite, Relational Cultural Theory and Rogerian approaches to facilitate relational empathy and use of the here and now to foster reflection, self-awareness, and sharing.   Observations: Abbeygail was present for about 0.5 of the group. She participated as able.  Gyselle Matthew L. Delores HERO.Div

## 2024-11-01 NOTE — Plan of Care (Signed)
   Problem: Education: Goal: Emotional status will improve Outcome: Progressing

## 2024-11-01 NOTE — Group Note (Signed)
 Date:  11/01/2024 Time:  4:53 PM  Group Topic/Focus:  Wellness Toolbox:   The focus of this group is to discuss various aspects of wellness, balancing those aspects and exploring ways to increase the ability to experience wellness.  Patients will create a wellness toolbox for use upon discharge.    Participation Level:  Did Not Attend  Alicia Mayo 11/01/2024, 4:53 PM

## 2024-11-01 NOTE — Group Note (Signed)
 Date:  11/01/2024 Time:  10:27 AM  Group Topic/Focus: Recreational Therapy     Pt did attend recreational therapy group  Alicia Mayo 11/01/2024, 10:27 AM

## 2024-11-01 NOTE — Group Note (Signed)
 Date:  11/01/2024 Time:  10:19 AM  Group Topic/Focus:  Goals Group:   The focus of this group is to help patients establish daily goals to achieve during treatment and discuss how the patient can incorporate goal setting into their daily lives to aide in recovery. Orientation:   The focus of this group is to educate the patient on the purpose and policies of crisis stabilization and provide a format to answer questions about their admission.  The group details unit policies and expectations of patients while admitted.    Participation Level:  Did Not Attend   Lauris JONELLE Morales 11/01/2024, 10:19 AM

## 2024-11-01 NOTE — Progress Notes (Signed)
 Patient is compliant with medication 1:1 ongoing. Patient unable to void though she endorses having the urge. PO fluids encouraged. Patient is a two person transfer from bed to Wheel Chair ambulating a few steps on front wheel walker from the Bathroom door to the commode with Staff assistance and gait belt around her waist for safety. Support ongoing.

## 2024-11-01 NOTE — Group Note (Unsigned)
 Date:  11/01/2024 Time:  9:38 AM  Group Topic/Focus:  Goals Group:   The focus of this group is to help patients establish daily goals to achieve during treatment and discuss how the patient can incorporate goal setting into their daily lives to aide in recovery. Orientation:   The focus of this group is to educate the patient on the purpose and policies of crisis stabilization and provide a format to answer questions about their admission.  The group details unit policies and expectations of patients while admitted.     Participation Level:  {BHH PARTICIPATION OZCZO:77735}  Participation Quality:  {BHH PARTICIPATION QUALITY:22265}  Affect:  {BHH AFFECT:22266}  Cognitive:  {BHH COGNITIVE:22267}  Insight: {BHH Insight2:20797}  Engagement in Group:  {BHH ENGAGEMENT IN HMNLE:77731}  Modes of Intervention:  {BHH MODES OF INTERVENTION:22269}  Additional Comments:  ***  Alicia Mayo 11/01/2024, 9:38 AM

## 2024-11-01 NOTE — Progress Notes (Signed)
 Spiritual care group on grief and loss facilitated by Chaplain Rockie Sofia, B[facility]  Group Goal: Support / Education around grief and loss  Members engage in facilitated group support and psycho-social education.  Group Description:  Following introductions and group rules, group members engaged in facilitated group dialogue and support around topic of loss, with particular support around experiences of loss in their lives. Group Identified types of loss (relationships / self / things) and identified patterns, circumstances, and changes that precipitate losses. Reflected on thoughts / feelings around loss, normalized grief responses, and recognized variety in grief experience. Group encouraged individual reflection on safe space and on the coping skills that they are already utilizing.  Group drew on Adlerian / Rogerian and narrative framework  Patient Progress: Alicia Mayo attended group and actively engaged and participated in group conversation and activities.

## 2024-11-01 NOTE — Group Note (Signed)
 Date:  11/01/2024 Time:  10:33 PM  Group Topic/Focus:  NA Group:    Additional Comments:  Pt was encouraged to attend but opted out of attending the NA meeting.    Marquette GORMAN Dover 11/01/2024, 10:33 PM

## 2024-11-01 NOTE — Progress Notes (Signed)
 1:1 Nursing note:  Pt is laying in bed . Respirations are even and unlabored. No signs of distress. 1:1 continued for pt safety.Pt toileted and transferred to her wheelchair. MHT is currently at bedside with pt.

## 2024-11-02 DIAGNOSIS — F0280 Dementia in other diseases classified elsewhere without behavioral disturbance: Secondary | ICD-10-CM | POA: Diagnosis not present

## 2024-11-02 DIAGNOSIS — G3183 Dementia with Lewy bodies: Secondary | ICD-10-CM

## 2024-11-02 LAB — GLUCOSE, CAPILLARY
Glucose-Capillary: 117 mg/dL — ABNORMAL HIGH (ref 70–99)
Glucose-Capillary: 130 mg/dL — ABNORMAL HIGH (ref 70–99)
Glucose-Capillary: 156 mg/dL — ABNORMAL HIGH (ref 70–99)
Glucose-Capillary: 163 mg/dL — ABNORMAL HIGH (ref 70–99)

## 2024-11-02 LAB — URINALYSIS, W/ REFLEX TO CULTURE (INFECTION SUSPECTED)
Bacteria, UA: NONE SEEN
Bilirubin Urine: NEGATIVE
Glucose, UA: NEGATIVE mg/dL
Hgb urine dipstick: NEGATIVE
Ketones, ur: NEGATIVE mg/dL
Nitrite: NEGATIVE
Protein, ur: NEGATIVE mg/dL
Specific Gravity, Urine: 1.012 (ref 1.005–1.030)
pH: 6 (ref 5.0–8.0)

## 2024-11-02 MED ORDER — ENSURE PLUS HIGH PROTEIN PO LIQD: 237.0000 mL | Freq: Two times a day (BID) | ORAL | Status: DC

## 2024-11-02 NOTE — BHH Group Notes (Signed)
 Adult Psychoeducational Group Note  Date:  11/02/2024 Time:  9:12 PM  Group Topic/Focus:  Wrap-Up Group:   The focus of this group is to help patients review their daily goal of treatment and discuss progress on daily workbooks.  Participation Level:  Did Not Attend  Alicia Mayo 11/02/2024, 9:12 PM

## 2024-11-02 NOTE — Progress Notes (Signed)
 1:1 Note  1715  Patient sitting up in bed eating dinner.  Patient did go to recreation activity with group and 1:1 this afternoon.  Patient stated she enjoyed the activity.  Patient did take nap after activity.  Patient continues to deny SI and HI.  Denied A/V hallucinations.  Denied pain.  Safety maintained with 1:1 per MD order.

## 2024-11-02 NOTE — Group Note (Signed)
 Date:  11/02/2024 Time:  12:18 PM  Group Topic/Focus: Social Work    Pt did not attend social work group  Britaney Espaillat R Kaysi Ourada 11/02/2024, 12:18 PM

## 2024-11-02 NOTE — Progress Notes (Signed)
(  Sleep Hours) -9.5 as of 0530 (Any PRNs that were needed, meds refused, or side effects to meds)- none (Any disturbances and when (visitation, over night)-none (Concerns raised by the patient)- none (SI/HI/AVH)- denies all

## 2024-11-02 NOTE — [facility] Suicide Risk Assessment (Signed)
 [facility] INPATIENT:  Family/Significant Other Suicide Prevention Education  Suicide Prevention Education:  Education Completed; Alicia Mayo [phone] (caretaker) has been identified by the patient as the family member/significant other with whom the patient will be residing, and identified as the person(s) who will aid the patient in the event of a mental health crisis (suicidal ideations/suicide attempt).  With written consent from the patient, the family member/significant other has been provided the following suicide prevention education, prior to the and/or following the discharge of the patient.  The suicide prevention education provided includes the following: Suicide risk factors Suicide prevention and interventions National Suicide Hotline telephone number [facility] assessment telephone number [facility] Emergency Assistance 911 [facility] and/or Residential Mobile Crisis Unit telephone number  Request made of family/significant other to: Remove weapons (e.g., guns, rifles, knives), all items previously/currently identified as safety concern.   Remove drugs/medications (over-the-counter, prescriptions, illicit drugs), all items previously/currently identified as a safety concern.  The family member/significant other verbalizes understanding of the suicide prevention education information provided.  The family member/significant other agrees to remove the items of safety concern listed above.  *[name] denies firearms in the home.   Alicia Mayo 11/02/2024, 9:20 AM

## 2024-11-02 NOTE — Progress Notes (Signed)
(  Sleep Hours) -6.25  (Any PRNs that were needed, meds refused, or side effects to meds)- none  (Any disturbances and when (visitation, over night)-none  (Concerns raised by the patient)- none  (SI/HI/AVH)- denies

## 2024-11-02 NOTE — Progress Notes (Signed)
 Resting per bed with eyes closed, resp even and unlabored, No distress noted. 1:1 observation continues. Will continue to try and collect urine sample. Pt has not went to restroom as of yet this shift.

## 2024-11-02 NOTE — Progress Notes (Addendum)
 1:1 Note  1930    Patient laying in bed resting.  Ate her dinner.  Respirations even and unlabored.  No signs/symptoms of pain/distress noted on patient's face/body movements.  Patient denied SI and HI, contracts for safety.  Denied A/V hallucinations.  Denied pain.  Patient  stated she appreciated all assistance from [facility] staff.  Everyone has been nice to her.  That she has enjoyed singing songs and conversations.  1:1 continues for safety per MD orders.

## 2024-11-02 NOTE — Progress Notes (Signed)
 [facility] Inpatient Psychiatry Progress Note  Date: 11/02/2024 Patient: Alicia Mayo MRN: [id number]  Assessment and Plan: Patient is a 58 year old female with a history of bipolar I disorder (though unable to identify any hospitalizations for this) and multiple prior strokes, presenting with visual and auditory hallucinations. She has had several recent medical hospitalizations for altered mental status, previously thought to be due to hypoactive delirium secondary to recurrent UTIs and medication polypharmacy. Possible etiologies for her current presentation include worsening of her primary psychiatric disorder, major neurocognitive disorder with psychotic features, and persistent low-level delirium. Her presentation appears predominantly neuropsychiatric in nature. Notably, her symptoms temporarily worsen following administration of Invega  Sustenna injections.  This patient's presentation of visual hallucinations, parkinsonian features and antipsychotic sensitivity align mostly due to dementia with Lewy bodies (DLB).  Patient's occasional presentations of akinesia potentially concerning for catatonia, though patient's minimal activity fluctuates throughout the day and does not rigid posturing, and Ativan challenge at this time could potentially worsen presentation.  12/25: Did not observe any hallucinations or delusional content, and 1:1 sitter does not report any behavioral dysregulation, hallucinations, delusions or confusion. Patient reports some auditory hallucinations of her brother at night but denies any visual hallucinations or delusions. Continues to have psychomotor slowing, though appears much improved from previous assessments, and thought process without bradyphrenia today, though occasionally latent. Patient now able to better assist and with transfers. Reassuring, so will continue carbidopa  levodopa  and continue to monitor response. Could consider mirtazapine  at  bedtime for appetite stimulation, though will hold for now to best monitor patient response to levodopa  and patient appetite much improved today.  Patient CK mildly elevated yesterday and warrants further monitoring I's and O's and continuing encouraging food intake, could be secondary to residual antipsychotic or statins. Follow UA, VSS and reassuring. OT evaluation once patient at psychiatric baseline.  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 -- continue carbidopa /levodopa  TID 50-200 - continue monitor Is and Os - Follow-up UA  Labs - Follow ammonia, vitamin D  15.5, CK 395  #Chronic pain / neuropathy -- Discontinued Lyrica  due to sedation on 10/29/2024 -- continue Subutex  1 mg sublingual daily-continue titration as able due to potential of excessive sedation and delirium   #Asthma / Allergies -- Continue albuterol  inhaler as needed -- Continue Breo-Ellipta 1 puff daily -- Continue loratidine 10 mg daily, consider discontinuation, can be contributing to excess sedation   #HTN / HLD -- Continue losartan  100 mg daily -- Continue metoprolol  50 mg daily -- continue Crestor  20 mg daily to optimize triglycerides  #low vit D - continue ergocalciferol  50K units q 7 days   #T2DM -- Continue metformin  1000 mg BID --AC nightly POCT CBG monitoring - HbA1c 9.7    #GERD / IBS -- Continue Protonix  40 mg daily -- Continue Linzess  290 mcg daily   #Nicotine  withdrawal - Patient in need of nicotine  replacement; nicotine  patch 14 mg / 24 hours ordered. Smoking cessation encouraged  Risk  Assessment -high, secondary to functional limitations  Discharge Planning Barriers to discharge: Functionality Estimated length of stay: 5 to 7 days Predicted Discharge location: Pending probable PT eval, or back home  to [name], her caregiver  Interval History and update:  Chart reviewed, no events overnight, VSS.  Patient had a shower this morning and was able to have urine sample collected. Patient denied SI, HI and AVH.  She did not appear to be responding to internal stimuli and there is no delusional content elicited, though endorses auditory hallucination last night of her brother speaking to her (unable to provide specifics). 1:1 sitter reports subjective improvement with motor functioning since observation Monday.  Sitter also reports patient providing more effort with transfers.  Patient has improvement with eating, had several bites of her breakfast, drinking adequate water and ginger ale and Glucerna well.  Patient later reevaluated again in the afternoon, sitting up in bed and clapping hands together to show enthusiasm for the holiday.   Physical Exam MSK-weak requires aid of wheelchair or walker, minimally increased tone of bilateral UE, some increased tone in bilateral LE, and tremor in RLE Neuro-mildly sedated, alert and oriented to person, place, time and situation  Mental Status Exam Appearance -older black woman with short curly hair sitting up in chair  behavior-Global bradykinesia, poor eye contact Attitude - Calm, polite, not guarded Speech -some minimally increased latency, soft-spoken Mood - encouraged Affect -full range, laughs appropriately Thought Process -occasionally latent but much improved, GD Thought Content - No delusional content expressed SI/HI - Denies  Perceptions -did not appear to be responding to internal stimuli Attention -poor Cognition-poor Judgement/Insight -impaired Fund of knowledge -poor Language -fair  Lab Results:  Admission on  10/28/2024  Component Date Value Ref Range Status   Hgb A1c MFr Bld 10/29/2024 9.7 (H)  4.8 - 5.6 % Final   Mean Plasma Glucose 10/29/2024 231.69  mg/dL Final   Cholesterol 87/78/7974 118  0 - 200 mg/dL Final   Triglycerides 87/78/7974 200 (H)  <150 mg/dL Final   HDL 87/78/7974 30 (L)  >40 mg/dL Final   Total CHOL/HDL Ratio 10/29/2024 3.9  RATIO Final   VLDL 10/29/2024 40  0 - 40 mg/dL Final   LDL Cholesterol 10/29/2024 48  0 - 99 mg/dL Final   Glucose-Capillary 10/29/2024 183 (H)  70 - 99 mg/dL Final   Glucose-Capillary 10/29/2024 126 (H)  70 - 99 mg/dL Final   Comment 1 87/78/7974 Notify RN   Final   Comment 2 10/29/2024 Document in Chart   Final   Vit D, 1,25-Dihydroxy 10/30/2024 15.5 (L)  24.8 - 81.5 pg/mL Final   Glucose-Capillary 10/30/2024 118 (H)  70 - 99 mg/dL Final   Glucose-Capillary 10/30/2024 197 (H)  70 - 99 mg/dL Final   Glucose-Capillary 10/30/2024 149 (H)  70 - 99 mg/dL Final   Glucose-Capillary 10/31/2024 112 (H)  70 - 99 mg/dL Final   Comment 1 87/76/7974 Notify RN   Final   Comment 2 10/31/2024 Document in Chart   Final   Glucose-Capillary 10/31/2024 117 (H)  70 - 99 mg/dL Final   Glucose-Capillary 10/31/2024 112 (H)  70 - 99 mg/dL Final   Total CK 87/76/7974 395 (H)  38 - 234 U/L Final   Glucose-Capillary 10/31/2024 115 (H)  70 - 99 mg/dL Final   Glucose-Capillary 11/01/2024 126 (H)  70 - 99 mg/dL Final   Glucose-Capillary 11/01/2024 171 (H)  70 - 99 mg/dL Final   Glucose-Capillary 11/01/2024 125 (H)  70 - 99 mg/dL Final   Glucose-Capillary 11/02/2024 117 (H)  70 - 99 mg/dL Final    Vitals: Blood pressure (!) 147/81, pulse 71, temperature 99.1 F (37.3 C), temperature  source Oral, resp. rate 12, height 5' 7 (1.702 m), weight 96.4 kg, SpO2 100%.   Alfornia Light, DO

## 2024-11-02 NOTE — Progress Notes (Signed)
 0715  1:1 Note Patient sitting in wheelchair in her room.  1:1 present for safety.  Patient has just completed her shower with 1:1 assistance.  Shower chair was used.  Patient stated she slept all night.  Patient denied SI and HI, contracts for safety.  Denied A/V hallucinations.  Denied pain.  1:1 will continue for safety per MD orders.

## 2024-11-02 NOTE — Progress Notes (Signed)
 Pt resting per bed, resp. Even and unlabored, no distress noted. 1:1 observation maintained

## 2024-11-02 NOTE — Group Note (Signed)
 Date:  11/02/2024 Time:  4:09 PM  Group Topic/Focus:  Emotional Wellness: focused on anger is to help participants understand, manage, and express their anger in healthy ways. The group seeks to foster a supportive environment where individuals can explore the emotions behind their anger and develop tools for coping with and responding to anger constructively. Educate patients to realize that anger is often a response to other emotions (like feeling misunderstood, ignored, or helpless) and guide them in exploring those deeper feelings.     Participation Level:  Did Not Attend  Alicia Mayo 11/02/2024, 4:09 PM

## 2024-11-02 NOTE — Plan of Care (Signed)
 Nurse discussed coping skills, medications, anxiety, depression with patient.

## 2024-11-02 NOTE — Progress Notes (Signed)
 Resting per bed at this time. Pt did get up and went to bathroom with assist of one without problems. Hat is in the toilet to catch urine, but patient only made a few drops. I&O is being kept. 1:1 person has been offering fluids while awake. Resp even and unlabored. Will continue to monitor.

## 2024-11-02 NOTE — Progress Notes (Signed)
 UA collected early morning and placed in lab refrigerator for next pick up.

## 2024-11-02 NOTE — Plan of Care (Signed)
?  Problem: Education: ?Goal: Emotional status will improve ?Outcome: Progressing ?Goal: Mental status will improve ?Outcome: Progressing ?Goal: Verbalization of understanding the information provided will improve ?Outcome: Progressing ?  ?Problem: Activity: ?Goal: Sleeping patterns will improve ?Outcome: Progressing ?  ?Problem: Coping: ?Goal: Ability to demonstrate self-control will improve ?Outcome: Progressing ?  ?

## 2024-11-02 NOTE — Progress Notes (Signed)
 1030  1:1 Note Patient laying in her bed on R side, resting comfortably.  Respirations even and unlabored.  No signs/symptoms of pain/distress noted on patient's face/body movements.  Patient has been to bathroom after breakfast using walker and 1:1 present for assistance.  Patient stated she ate good breakfast.  Patient has been up in wheelchair talking on phone in 400 hallway.  1:1 continues for patient's safety per MD order.

## 2024-11-02 NOTE — Final Progress Note (Signed)
 1:1 Note 1530  Patient ate her lunch.  Denied SI and HI, contracts for safety.  Denied A/V hallucinations.  Denied pain.  Respirations even and unlabored.   No signs/symptoms of pain/distress noted on patient's face/body movements.  Safety maintained with 1:1 per MD order.    Patient did have BM this morning per 1:1.  Also patient has been ambulating to bathroom with walker.  Minimal assistance by 1:1 for bathroom.

## 2024-11-02 NOTE — Progress Notes (Signed)
 1:1 Note  1315   Patient sitting up in bed eating lunch.  1:1 present with patient for safety.  Patient continues to deny SI and HI, contracts for safety.  Denied A/V hallucinations.  Respirations even and unlabored.  No signs/symptoms of pain/distress noted on patient's face/body movements.  1:1 continues for patient safety per MD order.

## 2024-11-02 NOTE — Group Note (Signed)
 Date:  11/02/2024 Time:  9:48 AM  Group Topic/Focus:  Goals Group:   The focus of this group is to help patients establish daily goals to achieve during treatment and discuss how the patient can incorporate goal setting into their daily lives to aide in recovery.    Participation Level:  Did Not Attend   Alicia Mayo 11/02/2024, 9:48 AM

## 2024-11-02 NOTE — Group Note (Signed)
 LCSW Group Therapy Note   Group Date: 11/02/2024 Start Time: 1100 End Time: 1200   Participation:  did not attend  Type of Therapy:  Group Therapy  Topic:  Understanding Your Path to Change  Objective:  The goal is to help individuals understand the stages of change, identify where they currently are in the process, and provide actionable next steps to continue moving forward in their journey of change.  Goals:  Learn about the six stages of change:  Pre-contemplation, Contemplation, Preparation, Action, Maintenance, and Relapse 2.  Reflect on Current Change Efforts:  Recognize which stage participants are in regarding a personal change. 3.  Plan Next Steps for Moving Forward:  Create an action plan based on their current stage of change.  Summary:  In this session, we explored the Stages of Change as a framework to understand the process of change.  We discussed how each stage helps individuals recognize where they are in their personal journey and used the Stages of Change Worksheet for self-reflection. Participants answered questions to better understand their current stage, challenges, and progress. We also emphasized the importance of moving forward, even if setbacks (Relapse) occur, and created actionable steps to help participants continue progressing. By the end of the session, participants gained a clearer understanding of their path to change and left with a clear plan for next steps.  Therapeutic Modalities:  Elements of CBT (cognitive restructuring, problem solving)  Element of DBT (mindfulness, distress tolerance)   Jackston [place] O Daine Gunther, LCSWA 11/02/2024  3:22 PM

## 2024-11-02 NOTE — Group Note (Signed)
 Date:  11/02/2024 Time:  8:22 PM  Group Topic/Focus:  Dimensions of Wellness:   The focus of this group is to introduce the topic of wellness and discuss the role each dimension of wellness plays in total health. Example: How our gut flora affects our mood.      Participation Level:  Did Not Attend Berwyn GORMAN Acosta 11/02/2024, 8:22 PM

## 2024-11-02 NOTE — Plan of Care (Signed)
   Problem: Education: Goal: Knowledge of [facility] Education information/materials will improve Outcome: Progressing Goal: Emotional status will improve Outcome: Progressing Goal: Mental status will improve Outcome: Progressing Goal: Verbalization of understanding the information provided will improve Outcome: Progressing

## 2024-11-03 ENCOUNTER — Encounter (HOSPITAL_COMMUNITY): Payer: Self-pay

## 2024-11-03 DIAGNOSIS — F0280 Dementia in other diseases classified elsewhere without behavioral disturbance: Secondary | ICD-10-CM | POA: Diagnosis not present

## 2024-11-03 DIAGNOSIS — G3183 Dementia with Lewy bodies: Secondary | ICD-10-CM | POA: Diagnosis not present

## 2024-11-03 LAB — COMPREHENSIVE METABOLIC PANEL WITH GFR
ALT: 10 U/L (ref 0–44)
AST: 15 U/L (ref 15–41)
Albumin: 4.1 g/dL (ref 3.5–5.0)
Alkaline Phosphatase: 72 U/L (ref 38–126)
Anion gap: 13 (ref 5–15)
BUN: 10 mg/dL (ref 6–20)
CO2: 25 mmol/L (ref 22–32)
Calcium: 9.8 mg/dL (ref 8.9–10.3)
Chloride: 102 mmol/L (ref 98–111)
Creatinine, Ser: 0.87 mg/dL (ref 0.44–1.00)
GFR, Estimated: >60 mL/min
Glucose, Bld: 182 mg/dL — ABNORMAL HIGH (ref 70–99)
Potassium: 3.4 mmol/L — ABNORMAL LOW (ref 3.5–5.1)
Sodium: 140 mmol/L (ref 135–145)
Total Bilirubin: 0.5 mg/dL (ref 0.0–1.2)
Total Protein: 7.9 g/dL (ref 6.5–8.1)

## 2024-11-03 LAB — GLUCOSE, CAPILLARY
Glucose-Capillary: 106 mg/dL — ABNORMAL HIGH (ref 70–99)
Glucose-Capillary: 171 mg/dL — ABNORMAL HIGH (ref 70–99)
Glucose-Capillary: 137 mg/dL — ABNORMAL HIGH (ref 70–99)
Glucose-Capillary: 164 mg/dL — ABNORMAL HIGH (ref 70–99)

## 2024-11-03 LAB — CK: Total CK: 135 U/L (ref 38–234)

## 2024-11-03 MED ORDER — ONDANSETRON 4 MG PO TBDP
4.0000 mg | ORAL_TABLET | Freq: Three times a day (TID) | ORAL | Status: DC | PRN
  Administered 2024-11-03 – 2024-11-10 (×14): 4 mg via ORAL
  Filled 2024-11-03 (×8): qty 1

## 2024-11-03 MED ORDER — CARBIDOPA-LEVODOPA 25-100 MG PO TABS
1.0000 | ORAL_TABLET | Freq: Three times a day (TID) | ORAL | Status: AC
  Administered 2024-11-03: 1 via ORAL
  Filled 2024-11-03: qty 1

## 2024-11-03 MED ORDER — CARBIDOPA-LEVODOPA 25-100 MG PO TABS
1.0000 | ORAL_TABLET | Freq: Three times a day (TID) | ORAL | Status: DC
  Administered 2024-11-04 – 2024-11-06 (×8): 1 via ORAL
  Filled 2024-11-03 (×9): qty 1

## 2024-11-03 NOTE — Plan of Care (Signed)
   Problem: Education: Goal: Emotional status will improve Outcome: Progressing Goal: Mental status will improve Outcome: Progressing   Problem: Activity: Goal: Interest or engagement in activities will improve Outcome: Progressing

## 2024-11-03 NOTE — Progress Notes (Signed)
 1:1 Note  Patient observation ongoing for safety . Patient remains safe is complaint with medication. Was siting up for breakfast and medications. Went to group and came back to her room. Its reported by [facility] that she was complaining of nausea. Patient is in bed sleeping at present. Respirations noted. Patient ambulating in wheelchair. Support ongoing.

## 2024-11-03 NOTE — Group Note (Signed)
 Date:  11/03/2024 Time:  10:59 AM  Group Topic/Focus: Goal and orientation  Goals Group:   The focus of this group is to help patients establish daily goals to achieve during treatment and discuss how the patient can incorporate goal setting into their daily lives to aide in recovery. Orientation:   The focus of this group is to educate the patient on the purpose and policies of crisis stabilization and provide a format to answer questions about their admission.  The group details unit policies and expectations of patients while admitted.    Participation Level:  Active  Participation Quality:  Appropriate  Affect:  Appropriate  Cognitive:  Alert  Insight: Appropriate  Engagement in Group:  Engaged  Modes of Intervention:  Orientation  Additional Comments:    Dolores CHRISTELLA Fredericks 11/03/2024, 10:59 AM

## 2024-11-03 NOTE — Progress Notes (Signed)
 Patient nauseated and vomited liquid emesis. C/O feeling quizy and gas in her abdomen. Reported ate breakfast. Prn Mylanta given for indigestion, and Provider notified. Prn Zofran  0DT give at 1300. Patient in bed at present. Will continue to monitor,. 1:1 Observation ongoing for safety. Patient remains safe.

## 2024-11-03 NOTE — Group Note (Signed)
 Date:  11/03/2024 Time:  4:09 PM  Group Topic/Focus: Social wellness  Wellness Toolbox:   The focus of this group is to discuss various aspects of wellness, balancing those aspects and exploring ways to increase the ability to experience wellness.  Patients will create a wellness toolbox for use upon discharge.    Participation Level:  Did Not Attend    Alicia Mayo 11/03/2024, 4:09 PM

## 2024-11-03 NOTE — Group Note (Signed)
 Recreation Therapy Group Note   Group Topic:Problem Solving  Group Date: 11/03/2024 Start Time: 0935 End Time: 1005 Facilitators: Savvas Roper-McCall, LRT,CTRS Location: [address] Dayroom   Group Topic: Communication, Team Building, Problem Solving  Goal Area(s) Addresses:  Patient will effectively work with peer towards shared goal.  Patient will identify skills used to make activity successful.  Patient will identify how skills used during activity can be used to reach post d/c goals.   Behavioral Response: Observant  Intervention: STEM Activity  Activity: Straw Bridge. In teams of 3-5, patients were given 15 plastic drinking straws and an equal length of masking tape. Using the materials provided, patients were instructed to build a free standing bridge-like structure to suspend an everyday item (ex: puzzle box) off of the floor or table surface. All materials were required to be used by the team in their design. LRT facilitated post-activity discussion reviewing team process. Patients were encouraged to reflect how the skills used in this activity can be generalized to daily life post discharge.   Education: Pharmacist, Community, Scientist, Physiological, Discharge Planning   Education Outcome: Acknowledges education/In group clarification offered/Needs additional education.    Affect/Mood: Appropriate   Participation Level: Minimal   Participation Quality: Independent   Behavior: Attentive  and On-looking   Speech/Thought Process: Relevant   Insight: None   Judgement: None   Modes of Intervention: STEM Activity   Patient Response to Interventions:  Attentive   Education Outcome:  In group clarification offered    Clinical Observations/Individualized Feedback: Pt was quiet but observant. Pt assisted with handing supplies to peers. Pt was attentive throughout group.      Plan: Continue to engage patient in RT group sessions 2-3x/week.   Jong Rickman-McCall,  LRT,CTRS 11/03/2024 11:59 AM

## 2024-11-03 NOTE — Progress Notes (Signed)
 [facility] Progress Note  Date: 11/03/2024 Patient: Alicia Mayo MRN: [id number]  Assessment and Plan: Patient is a 58 year old female with a history of bipolar I disorder (though unable to identify any hospitalizations for this) and multiple prior strokes, presenting with visual and auditory hallucinations. She has had several recent medical hospitalizations for altered mental status, previously thought to be due to hypoactive delirium secondary to recurrent UTIs and medication polypharmacy. Possible etiologies for her current presentation include worsening of her primary psychiatric disorder, major neurocognitive disorder with psychotic features, and persistent low-level delirium. Her presentation appears predominantly neuropsychiatric in nature. Notably, her symptoms temporarily worsen following administration of Invega  Sustenna injections.  This patient's presentation of visual hallucinations, parkinsonian features and antipsychotic sensitivity align mostly due to dementia with Lewy bodies (DLB).  Patient's occasional presentations of akinesia potentially concerning for catatonia, though patient's minimal activity fluctuates throughout the day and does not rigid posturing, and Ativan challenge at this time could potentially worsen presentation.  12/26: Did not observe any hallucinations or delusional content, and 1:1 sitter does not report any behavioral dysregulation, hallucinations, delusions or confusion. Thought processes is more goal-directed and psychomotor slowing has improved significantly.  Patient now able to better assist and with transfers. Reassuring, so will start to taper carbidopa  levodopa  and continue to monitor response to this decrease. Patient reporting nausea, likely side effect from initiation of Sinemet , dose reduction should help and patient received one-time dose of Zofran .  Could consider mirtazapine  at bedtime for appetite stimulation,  though will hold for now as appetite is improving. UA WNL, VSS and reassuring. OT evaluation once patient at psychiatric baseline.  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 -- Decrease carbidopa /levodopa  TID to 25-100 and monitor movements - continue monitor Is and Os - UA WNL  Labs - Follow ammonia, vitamin D  15.5, CK 395 - reorder CMP, CK  #Chronic pain / neuropathy -- Discontinued Lyrica  due to sedation on 10/29/2024 -- continue Subutex  1 mg sublingual daily   #Asthma / Allergies -- Continue albuterol  inhaler as needed -- Continue Breo-Ellipta 1 puff daily -- Continue loratidine 10 mg daily   #HTN / HLD -- Continue losartan  100 mg daily -- Continue metoprolol  50 mg daily -- continue Crestor  20 mg daily   #low vit D - continue ergocalciferol  50K units q 7 days   #T2DM -- Continue metformin  1000 mg BID --AC nightly POCT CBG monitoring - HbA1c 9.7    #GERD / IBS -- Continue Protonix  40 mg daily -- Continue Linzess  290 mcg daily   #Nicotine  withdrawal - Patient in need of nicotine  replacement; nicotine  patch 14 mg / 24 hours ordered. Smoking cessation encouraged  Risk Assessment -high, secondary to functional limitations  Discharge Planning Barriers to discharge: Functionality Estimated length of stay: 5 to 7 days Predicted Discharge location: Pending probable PT eval, or back home to [place], her caregiver  Interval History and update:  Chart reviewed, no events overnight, VSS, UA returned WNL. Patient denied SI, HI and AVH.  She did not appear to be responding to  internal stimuli and there is no delusional content elicited.  Yesterday, 1:1 sitter  reports subjective improvement with motor functioning, patient participated with milieu, went outside, and came back and was picking things up in her room, sitter also reports patient providing more effort with transfers.  However today, patient is overall less active today, and nauseous, suspected side effects from Sinemet .  Patient has improvement with eating, reported to be full following Christmas meal yesterday.     Physical Exam MSK-weak requires aid of wheelchair or walker, minimally increased tone of bilateral UE, some increased tone in bilateral LE, and tremor in RLE Neuro-mildly sedated, alert and oriented to person, place, time and situation  Mental Status Exam Appearance -older black woman with short curly hair laying down in her bed behavior-Global bradykinesia, poor eye contact Attitude - Calm, polite, not guarded Speech -some minimally increased latency, soft-spoken Mood - euthymic Affect -blunted Thought Process -occasionally latent but much improved, GD Thought Content - No delusional content expressed SI/HI - Denies  Perceptions -did not appear to be responding to internal stimuli Attention -poor Cognition-poor Judgement/Insight -impaired Fund of knowledge -poor Language -fair  Lab Results:  Admission on 10/28/2024  Component Date Value Ref Range Status   Hgb A1c MFr Bld 10/29/2024 9.7 (H)  4.8 - 5.6 % Final   Mean Plasma Glucose 10/29/2024 231.69  mg/dL Final   Cholesterol 87/78/7974 118  0 - 200 mg/dL Final   Triglycerides 87/78/7974 200 (H)  <150 mg/dL Final   HDL 87/78/7974 30 (L)  >40 mg/dL Final   Total CHOL/HDL Ratio 10/29/2024 3.9  RATIO Final   VLDL 10/29/2024 40  0 - 40 mg/dL Final   LDL Cholesterol 10/29/2024 48  0 - 99 mg/dL Final   Glucose-Capillary 10/29/2024 183 (H)  70 - 99 mg/dL Final   Glucose-Capillary 10/29/2024 126 (H)  70 - 99 mg/dL Final   Comment 1  87/78/7974 Notify RN   Final   Comment 2 10/29/2024 Document in Chart   Final   Vit D, 1,25-Dihydroxy 10/30/2024 15.5 (L)  24.8 - 81.5 pg/mL Final   Glucose-Capillary 10/30/2024 118 (H)  70 - 99 mg/dL Final   Glucose-Capillary 10/30/2024 197 (H)  70 - 99 mg/dL Final   Glucose-Capillary 10/30/2024 149 (H)  70 - 99 mg/dL Final   Glucose-Capillary 10/31/2024 112 (H)  70 - 99 mg/dL Final   Comment 1 87/76/7974 Notify RN   Final   Comment 2 10/31/2024 Document in Chart   Final   Glucose-Capillary 10/31/2024 117 (H)  70 - 99 mg/dL Final   Glucose-Capillary 10/31/2024 112 (H)  70 - 99 mg/dL Final   Total CK 87/76/7974 395 (H)  38 - 234 U/L Final   Glucose-Capillary 10/31/2024 115 (H)  70 - 99 mg/dL Final   Glucose-Capillary 11/01/2024 126 (H)  70 - 99 mg/dL Final   Specimen Source 11/02/2024 URINE, CLEAN CATCH   Final   Color, Urine 11/02/2024 YELLOW  YELLOW Final   APPearance 11/02/2024 CLEAR  CLEAR Final   Specific Gravity, Urine 11/02/2024 1.012  1.005 - 1.030 Final   pH 11/02/2024 6.0  5.0 - 8.0 Final   Glucose, UA 11/02/2024 NEGATIVE  NEGATIVE mg/dL Final   Hgb urine dipstick 11/02/2024 NEGATIVE  NEGATIVE Final   Bilirubin Urine 11/02/2024 NEGATIVE  NEGATIVE Final   Ketones, ur 11/02/2024 NEGATIVE  NEGATIVE mg/dL Final   Protein, ur 87/74/7974 NEGATIVE  NEGATIVE mg/dL Final   Nitrite 87/74/7974 NEGATIVE  NEGATIVE Final   Leukocytes,Ua 11/02/2024 SMALL (A)  NEGATIVE Final   RBC / HPF 11/02/2024 0-5  0 -  5 RBC/hpf Final   WBC, UA 11/02/2024 0-5  0 - 5 WBC/hpf Final   Bacteria, UA 11/02/2024 NONE SEEN  NONE SEEN Final   Squamous Epithelial / HPF 11/02/2024 0-5  0 - 5 /HPF Final   Mucus 11/02/2024 PRESENT   Final   Hyaline Casts, UA 11/02/2024 PRESENT   Final   Glucose-Capillary 11/01/2024 171 (H)  70 - 99 mg/dL Final   Glucose-Capillary 11/01/2024 125 (H)  70 - 99 mg/dL Final   Glucose-Capillary 11/02/2024 117 (H)  70 - 99 mg/dL Final   Glucose-Capillary 11/02/2024 130 (H)  70 - 99  mg/dL Final   Comment 1 87/74/7974 Notify RN   Final   Glucose-Capillary 11/02/2024 156 (H)  70 - 99 mg/dL Final   Glucose-Capillary 11/02/2024 163 (H)  70 - 99 mg/dL Final   Glucose-Capillary 11/03/2024 106 (H)  70 - 99 mg/dL Final    Vitals: Blood pressure (!) 143/75, pulse 78, temperature 98.3 F (36.8 C), temperature source Oral, resp. rate 16, height 5' 7 (1.702 m), weight 96.4 kg, SpO2 100%.   Alfornia Light, DO

## 2024-11-03 NOTE — Group Note (Signed)
 Date:  11/03/2024 Time:  4:58 PM  Group Topic/Focus:  Overcoming Stress:   The focus of this group is to define stress and help patients assess their triggers. Identifying Stressors: External and Internal What Kind of Stressors Brought You in to the Hospital and What Kind of Coping Skills Can You Use to Alleviate Them?    Participation Level:  Did Not Attend   Inocente PARAS [facility] 11/03/2024, 4:58 PM

## 2024-11-03 NOTE — BH IP Treatment Plan (Signed)
 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, with psychotic disturbance (HCC)   Nonpsychotic mental disorder, unspecified   Current Medications:  Current Facility-Administered Medications  Medication Dose Route Frequency Provider Last Rate Last Admin   albuterol  (VENTOLIN  HFA) 108 (90 Base) MCG/ACT inhaler 2 puff  2 puff Inhalation Q4H PRN Bobbitt, Shalon E, NP       alum & mag hydroxide-simeth (MAALOX/MYLANTA) 200-200-20 MG/5ML suspension 30 mL  30 mL Oral Q4H PRN Bobbitt, Shalon E, NP   30 mL at 11/03/24 1224   ammonium lactate  (AMLACTIN) 12 % cream 1 Application  1 Application Topical PRN Mannie Ashley SAILOR, MD       buprenorphine  (SUBUTEX ) SL tablet 1 mg  1 mg Sublingual Daily Faunce, Alina, DO   1 mg at 11/03/24 0815   busPIRone  (BUSPAR ) tablet 5 mg  5 mg Oral BID Faunce, Alina, DO   5 mg at 11/03/24 1800   [START ON 11/04/2024] carbidopa -levodopa  (SINEMET  IR) 25-100 MG per tablet immediate release 1 tablet  1 tablet Oral TID Faunce, Alina, DO       feeding supplement (GLUCERNA SHAKE) (GLUCERNA SHAKE) liquid 237 mL  237 mL Oral TID BM Bouchard, Marc A, DO   237 mL at 11/03/24 1500   fluticasone  furoate-vilanterol (BREO ELLIPTA ) 200-25 MCG/ACT 1 puff  1 puff Inhalation Daily Mannie Ashley SAILOR, MD   1 puff at 11/03/24 9187   linaclotide  (LINZESS ) capsule 290 mcg  290 mcg Oral Daily Faunce, Alina, DO   290 mcg at 11/03/24 9188   loratadine  (CLARITIN ) tablet 10 mg  10 mg Oral Daily Faunce, Alina, DO   10 mg at 11/03/24 9187   losartan  (COZAAR ) tablet 100 mg  100 mg Oral Daily Bobbitt, Shalon E, NP   100 mg at 11/03/24 0811   melatonin tablet 5 mg  5 mg Oral QHS  Bouchard, Marc A, DO   5 mg at 11/02/24 2101   metFORMIN  (GLUCOPHAGE -XR) 24 hr tablet 1,000 mg  1,000 mg Oral BID Bobbitt, Shalon E, NP   1,000 mg at 11/03/24 1800   metoprolol  tartrate (LOPRESSOR ) tablet 50 mg  50 mg Oral BID Bobbitt, Shalon E, NP   50 mg at 11/03/24 1800   nicotine  (NICODERM CQ  - dosed in mg/24 hours) patch 14 mg  14 mg Transdermal Daily Pashayan, Alexander S, DO   14 mg at 11/02/24 0757   ondansetron  (ZOFRAN -ODT) disintegrating tablet 4 mg  4 mg Oral Q8H PRN Collene Gouge I, NP   4 mg at 11/03/24 1300   pantoprazole  (PROTONIX ) EC tablet 40 mg  40 mg Oral Daily Stevens, Briana N, MD   40 mg at 11/03/24 9188   rosuvastatin  (CRESTOR ) tablet 20 mg  20 mg Oral Daily Faunce, Alina, DO   20 mg at 11/03/24 9188   Vitamin D  (Ergocalciferol ) (DRISDOL ) 1.25 MG (50000 UNIT) capsule 50,000 Units  50,000 Units Oral Q7 days Idelle Nakai, DO   50,000 Units at 11/01/24 1110   PTA Medications: Medications Prior to Admission  Medication Sig Dispense Refill Last Dose/Taking   albuterol  (VENTOLIN  HFA) 108 (90 Base) MCG/ACT inhaler Inhale 2 puffs into the lungs every 4 (four) hours as needed.      ammonium lactate  (AMLACTIN)  12 % cream Apply 1 Application topically 2 (two) times daily as needed for dry skin (apply to affected areas).      benztropine  (COGENTIN ) 1 MG tablet Take 0.5 tablets (0.5 mg total) by mouth 2 (two) times daily as needed for tremors.      buprenorphine  (SUBUTEX ) 2 MG SUBL SL tablet Place 2 mg under the tongue in the morning, at noon, and at bedtime.      busPIRone  (BUSPAR ) 15 MG tablet Take 15 mg by mouth 3 (three) times daily.      escitalopram  (LEXAPRO ) 20 MG tablet Take 20 mg by mouth daily.      fexofenadine (ALLEGRA) 180 MG tablet Take 180 mg by mouth daily.      fluticasone  (FLONASE ) 50 MCG/ACT nasal spray Place 2 sprays into the nose daily.      fluticasone -salmeterol (ADVAIR) 250-50 MCG/ACT AEPB Inhale 1 puff into the lungs 2 (two) times daily.      furosemide  (LASIX )  20 MG tablet Take 20 mg by mouth daily.      INVEGA  SUSTENNA 234 MG/1.5ML injection Inject 234 mg into the muscle once.      lactulose  (CHRONULAC ) 10 GM/15ML solution Take 45 mLs (30 g total) by mouth daily as needed for moderate constipation or severe constipation. 236 mL 0    lidocaine  (LIDODERM ) 5 % Place 1 patch onto the skin daily.      LINZESS  290 MCG CAPS capsule Take 290 mcg by mouth daily.      losartan  (COZAAR ) 100 MG tablet Take 100 mg by mouth daily.      Lurasidone  HCl 120 MG TABS Take 1 tablet by mouth daily.      metFORMIN  (GLUCOPHAGE -XR) 500 MG 24 hr tablet Take 1,000 mg by mouth 2 (two) times daily.      metoprolol  tartrate (LOPRESSOR ) 50 MG tablet Take 50 mg by mouth 2 (two) times daily.      naloxone  (NARCAN ) nasal spray 4 mg/0.1 mL Place 1 spray into the nose once.      paliperidone  (INVEGA ) 3 MG 24 hr tablet Take 2 tablets (6 mg total) by mouth at bedtime. 30 tablet 0    pantoprazole  (PROTONIX ) 40 MG tablet Take 40 mg by mouth daily.      polyethylene glycol (MIRALAX  / GLYCOLAX ) 17 g packet Take 17 g by mouth daily as needed.      pregabalin  (LYRICA ) 150 MG capsule Take 1 capsule (150 mg total) by mouth 2 (two) times daily.      rosuvastatin  (CRESTOR ) 5 MG tablet Take 5 mg by mouth daily.      trospium (SANCTURA) 20 MG tablet Take 20 mg by mouth 2 (two) times daily.       Patient Stressors: Financial difficulties   Health problems    Patient Strengths: Ability for insight  Active sense of humor  Motivation for treatment/growth   Treatment Modalities: Medication Management, Group therapy, Case management,  1 to 1 session with clinician, Psychoeducation, Recreational therapy.   Physician Treatment Plan for Primary Diagnosis: Schizoaffective disorder (HCC) Long Term Goal(s):     Short Term Goals: Ability to maintain clinical measurements within normal limits will improve  Medication Management: Evaluate patient's response, side effects, and tolerance of medication  regimen.  Therapeutic Interventions: 1 to 1 sessions, Unit Group sessions and Medication administration.  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  Therapeutic Interventions: 1 to 1 sessions, Unit Group sessions and Medication administration.  Evaluation of Outcomes: Progressing   RN Treatment Plan for Primary Diagnosis: Schizoaffective disorder (HCC) Long Term Goal(s): Knowledge of disease and therapeutic regimen to maintain health will improve  Short Term Goals: Ability to remain free from injury will improve, Ability to verbalize frustration and anger appropriately will improve, Ability to verbalize feelings will improve, and Ability to disclose and discuss suicidal ideas  Medication Management: RN will administer medications as ordered by provider, will assess and evaluate patient's response and provide education to patient for prescribed medication. RN will report any adverse and/or side effects to prescribing provider.  Therapeutic Interventions: 1 on 1 counseling sessions, Psychoeducation, Medication administration, Evaluate responses to treatment, Monitor vital signs and CBGs as ordered, Perform/monitor CIWA, COWS, AIMS and Fall Risk screenings as ordered, Perform wound care treatments as ordered.  Evaluation of Outcomes: Progressing   LCSW Treatment Plan for Primary Diagnosis: Schizoaffective disorder (HCC) Long Term Goal(s): Safe transition to appropriate next level  of care at discharge, Engage patient in therapeutic group addressing interpersonal concerns.  Short Term Goals: Engage patient in aftercare planning with referrals and resources, Increase ability to appropriately verbalize feelings, Facilitate acceptance of mental health diagnosis and concerns, and Identify triggers associated with mental health/substance abuse issues  Therapeutic Interventions: Assess for all discharge needs, 1 to 1 time with Social worker, Explore available resources and support systems, Assess for adequacy in community support network, Educate family and significant other(s) on suicide prevention, Complete Psychosocial Assessment, Interpersonal group therapy.  Evaluation of Outcomes: Progressing   Progress in Treatment: Attending groups: attended some groups Participating in groups: Yes. Taking medication as prescribed: Yes. Toleration medication: Yes. Family/Significant other contact made: Yes, contacted [name] [phone] (caretaker)  Patient understands diagnosis: Yes. Discussing patient identified problems/goals with staff: Yes. Medical problems stabilized or resolved: Yes. Denies suicidal/homicidal ideation: Yes. Issues/concerns per patient self-inventory: No.   New problem(s) identified:  No   New Short Term/Long Term Goal(s):     medication stabilization, elimination of SI thoughts, development of comprehensive mental wellness plan.      Patient Goals:  I would like to review and adjust my medications to help with hallucinations.   Discharge Plan or Barriers:  Patient recently admitted. CSW will continue to follow and assess for appropriate referrals and possible discharge planning.      Reason for Continuation of Hospitalization: Hallucinations Medication stabilization   Estimated Length of Stay:  4 - 6 days  Last 3 [place] Suicide Severity Risk Score: Flowsheet Row [facility] 10/27/2024 in [facility] ED to Hosp-Admission (Discharged) from 08/19/2024 in [facility] ([facility]A)  C-SSRS RISK CATEGORY No Risk No Risk No Risk    Last PHQ 2/9 Scores:     No data to display          Scribe for Treatment Team: Tesslyn Baumert O Rosebud Koenen, LCSWA 11/03/2024 8:27 PM

## 2024-11-03 NOTE — Progress Notes (Signed)
 Patient stated she is feeling better after the Zofran  today her speech is better more clear with pleasant affect and moving a bit better. 1:1 Observation ongoing for safety and Patient remains safe.

## 2024-11-03 NOTE — Group Note (Addendum)
 Date:  11/03/2024 Time:  11:15 AM  Group Topic/Focus: Recreational Therapy Critical thinking in recreational therapy for mental health patients is essential for providing personalized, effective care. It enables therapists to assess each patient's unique needs, goals, and abilities, ensuring that interventions are tailored to promote emotional, cognitive, and social well-being. By critically evaluating patients' interests and challenges, therapists can select appropriate activities that align with therapeutic goals, adapt them as needed, and troubleshoot barriers to participation. Critical thinking also helps therapists track progress and make necessary adjustments to ensure activities are effective. Additionally, it encourages patients to develop self-reflection and problem-solving skills, empowering them to gain insight into their own behaviors and emotions. Overall, critical thinking in recreational therapy fosters a flexible, responsive approach that supports positive mental health outcomes and helps patients achieve greater independence and coping skills.    Participation Level Patient did attend   Alicia Mayo 11/03/2024, 11:15 AM

## 2024-11-04 ENCOUNTER — Encounter (HOSPITAL_COMMUNITY): Payer: Self-pay | Admitting: Psychiatry

## 2024-11-04 DIAGNOSIS — F259 Schizoaffective disorder, unspecified: Secondary | ICD-10-CM | POA: Diagnosis not present

## 2024-11-04 LAB — GLUCOSE, CAPILLARY
Glucose-Capillary: 105 mg/dL — ABNORMAL HIGH (ref 70–99)
Glucose-Capillary: 156 mg/dL — ABNORMAL HIGH (ref 70–99)
Glucose-Capillary: 129 mg/dL — ABNORMAL HIGH (ref 70–99)

## 2024-11-04 NOTE — Group Note (Signed)
 Date:  11/04/2024 Time:  6:30 PM  Group Topic/Focus:  Emotional Education:   The focus of this group is to discuss what feelings/emotions are, and how they are experienced.    Participation Level:  Did Not Attend  Participation Quality:    Affect:    Cognitive:    Insight:   Engagement in Group:    Modes of Intervention:    Additional Comments:    Asberry CROME Rachel Samples 11/04/2024, 6:30 PM

## 2024-11-04 NOTE — Plan of Care (Signed)
   Problem: Education: Goal: Knowledge of Alicia Mayo General Education information/materials will improve Outcome: Progressing Goal: Emotional status will improve Outcome: Progressing Goal: Mental status will improve Outcome: Progressing Goal: Verbalization of understanding the information provided will improve Outcome: Progressing   Problem: Activity: Goal: Interest or engagement in activities will improve Outcome: Progressing Goal: Sleeping patterns will improve Outcome: Progressing   Problem: Coping: Goal: Ability to verbalize frustrations and anger appropriately will improve Outcome: Progressing Goal: Ability to demonstrate self-control will improve Outcome: Progressing

## 2024-11-04 NOTE — Plan of Care (Signed)

## 2024-11-04 NOTE — Group Note (Signed)
 Date:  11/04/2024 Time:  4:12 PM  Group Topic/Focus:  Nurse group    Participation Level:  Did Not Attend  Logan LITTIE Molly 11/04/2024, 4:12 PM

## 2024-11-04 NOTE — [facility] Group Notes (Signed)
 The focus of this group is to help patients establish daily goals to achieve during treatment and discuss how the patient can incorporate goal setting into their daily lives to aide in recovery.    Scale 1-10  10  Goal:  Stay feeling better

## 2024-11-04 NOTE — Progress Notes (Signed)
 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 female with a past psychiatric history of bipolar I disorder and . Patient initially arrived to [facility] on 10/27/24 for visual and auditory hallucinations and was admitted to [facility] voluntarily on 10/28/24 for crisis stabalization and stabilization of acute on chronic psychiatric conditions. PMHx is significant for hypertension, asthma, COPD, OSA, gastroparesis, NASH, type 2 diabetes, multiple strokes, osteoarthritis, recurrent UTIs, chronic low back pain, urinary incontinence.   (admitted on 10/28/2024, total  LOS: 7 days )  Chart review: Overnight Events --complaint of nausea, given PRN zofran  Vital signs: slightly hypertensive this AM, normotensive prior   MAR was reviewed and patient was compliant with medications.  Patient received PRN zofran  Sleep: 10.25 hours  Nursing notes: attended a couple of groups -CK wnl, cmp with slightly hypokalemia, otherwise wnl  Information Obtained Today During Patient Interview: Patient evaluated at her bedside with 1:1 sitter. She reports that she is feeling somewhat nauseated after eating breakfast this AM. She reports PRN zofran  helped. She reports otherwise her mood is good and reports that she is not seeing kittens anymore which is part of what led to her admission. She is oriented to self, place, year, month and is aware of current president though could not name past president. She reports that she feels that she is improved compared to prior to admission and asked about when her discharge was. We discussed would continue to  evaluate for improvement and she states that she is not rushing. She denies SI/HI/AVH.   Past Psychiatric History:  Current psychiatrist: None Current therapist: None Previous psychiatric diagnoses: bipolar I disorder, anxiety Psychiatric Medications:             -Home Meds: Invega  Sustenna 234 mg, Cogentin  1 mg BID PRN, Buspar  15 mg TID, Lexapro  20 mg, Latuda  120 mg daily             -Past Med Trials: Trintellix, Valium, Trazodone, Lexapro , amitriptyline, Remeron  Psychiatric hospitalization(s): Yes Psychotherapy history: Denies Neuromodulation history: Denies History of suicide (obtained from HPI): Denies History of homicide or aggression (obtained in HPI): Denies NSSIB: Denies  Family Psychiatric History:  Bipolar disorder in her maternal aunt and mother. Schizophrenia in her child.   Social History:  Current living situation: Lives alone in an apartment. Has two caretakers who see her daily. Education: Finished 11th grade. Required extra assistance during grade school. Occupational history: Retired Marital status: Divorced Children: Yes, 6 children but 1 passed away 2 years ago Legal: Denies Military: No  Past Medical History:  Past Medical History:  Diagnosis Date   Asthma    COPD (chronic obstructive pulmonary disease) (HCC)    Diabetes mellitus without complication (HCC)    Fibromyalgia    Hypertension    Stroke (HCC) 2023   2023, 2008 and others. 8 strokes. R hemiparesis   Family History: History reviewed. No pertinent family history.  Current Medications: Current Facility-Administered Medications  Medication Dose Route Frequency Provider Last Rate Last Admin   albuterol  (VENTOLIN  HFA) 108 (90 Base) MCG/ACT inhaler 2 puff  2 puff Inhalation Q4H PRN Bobbitt, Shalon E, NP  alum & mag hydroxide-simeth (MAALOX/MYLANTA) 200-200-20 MG/5ML suspension 30 mL  30 mL Oral Q4H PRN Bobbitt, Shalon E, NP   30 mL at 11/03/24 1224   ammonium lactate  (AMLACTIN) 12 % cream 1  Application  1 Application Topical PRN Mannie Ashley SAILOR, MD       buprenorphine  (SUBUTEX ) SL tablet 1 mg  1 mg Sublingual Daily Faunce, Alina, DO   1 mg at 11/04/24 0858   busPIRone  (BUSPAR ) tablet 5 mg  5 mg Oral BID Faunce, Alina, DO   5 mg at 11/04/24 0854   carbidopa -levodopa  (SINEMET  IR) 25-100 MG per tablet immediate release 1 tablet  1 tablet Oral TID Faunce, Alina, DO   1 tablet at 11/04/24 0854   feeding supplement (GLUCERNA SHAKE) (GLUCERNA SHAKE) liquid 237 mL  237 mL Oral TID BM Carollynn Pennywell, MD   237 mL at 11/03/24 2059   fluticasone  furoate-vilanterol (BREO ELLIPTA ) 200-25 MCG/ACT 1 puff  1 puff Inhalation Daily Mannie Ashley SAILOR, MD   1 puff at 11/04/24 0850   linaclotide  (LINZESS ) capsule 290 mcg  290 mcg Oral Daily Faunce, Alina, DO   290 mcg at 11/04/24 0854   loratadine  (CLARITIN ) tablet 10 mg  10 mg Oral Daily Faunce, Alina, DO   10 mg at 11/04/24 0854   losartan  (COZAAR ) tablet 100 mg  100 mg Oral Daily Bobbitt, Shalon E, NP   100 mg at 11/04/24 0854   melatonin tablet 5 mg  5 mg Oral QHS Bouchard, Marc A, DO   5 mg at 11/03/24 2109   metFORMIN  (GLUCOPHAGE -XR) 24 hr tablet 1,000 mg  1,000 mg Oral BID Bobbitt, Shalon E, NP   1,000 mg at 11/04/24 0854   metoprolol  tartrate (LOPRESSOR ) tablet 50 mg  50 mg Oral BID Bobbitt, Shalon E, NP   50 mg at 11/04/24 0854   nicotine  (NICODERM CQ  - dosed in mg/24 hours) patch 14 mg  14 mg Transdermal Daily Pashayan, Alexander S, DO   14 mg at 11/02/24 0757   ondansetron  (ZOFRAN -ODT) disintegrating tablet 4 mg  4 mg Oral Q8H PRN Collene Gouge I, NP   4 mg at 11/04/24 0853   pantoprazole  (PROTONIX ) EC tablet 40 mg  40 mg Oral Daily Stevens, Briana N, MD   40 mg at 11/04/24 0854   rosuvastatin  (CRESTOR ) tablet 20 mg  20 mg Oral Daily Faunce, Alina, DO   20 mg at 11/04/24 9145   Vitamin D  (Ergocalciferol ) (DRISDOL ) 1.25 MG (50000 UNIT) capsule 50,000 Units  50,000 Units Oral Q7 days Idelle Nakai, DO   50,000 Units at 11/01/24 1110    Lab  Results:  Results for orders placed or performed during the hospital encounter of 10/28/24 (from the past 48 hours)  Glucose, capillary     Status: Abnormal   Collection Time: 11/02/24 12:09 PM  Result Value Ref Range   Glucose-Capillary 130 (H) 70 - 99 mg/dL    Comment: Glucose reference range applies only to samples taken after fasting for at least 8 hours.   Comment 1 Notify RN   Glucose, capillary     Status: Abnormal   Collection Time: 11/02/24  5:30 PM  Result Value Ref Range   Glucose-Capillary 156 (H) 70 - 99 mg/dL    Comment: Glucose reference range applies only to samples taken after fasting for at least 8 hours.  Urinalysis, w/ Reflex to Culture (Infection Suspected) -Urine, Clean Catch     Status: Abnormal   Collection Time: 11/02/24  7:15 PM  Result Value Ref Range   Specimen Source URINE, CLEAN CATCH    Color, Urine YELLOW YELLOW   APPearance CLEAR CLEAR   Specific Gravity, Urine 1.012 1.005 - 1.030   pH 6.0 5.0 - 8.0   Glucose, UA NEGATIVE NEGATIVE mg/dL   Hgb urine dipstick NEGATIVE NEGATIVE   Bilirubin Urine NEGATIVE NEGATIVE   Ketones, ur NEGATIVE NEGATIVE mg/dL   Protein, ur NEGATIVE NEGATIVE mg/dL   Nitrite NEGATIVE NEGATIVE   Leukocytes,Ua SMALL (A) NEGATIVE   RBC / HPF 0-5 0 - 5 RBC/hpf   WBC, UA 0-5 0 - 5 WBC/hpf    Comment:        Reflex urine culture not performed if WBC <=10, OR if Squamous epithelial cells >5. If Squamous epithelial cells >5 suggest recollection.    Bacteria, UA NONE SEEN NONE SEEN   Squamous Epithelial / HPF 0-5 0 - 5 /HPF   Mucus PRESENT    Hyaline Casts, UA PRESENT     Comment: Performed at [facility], [address] [place], [place] [address]  Glucose, capillary     Status: Abnormal   Collection Time: 11/02/24  9:01 PM  Result Value Ref Range   Glucose-Capillary 163 (H) 70 - 99 mg/dL    Comment: Glucose reference range applies only to samples taken after fasting for at least 8 hours.  Glucose,  capillary     Status: Abnormal   Collection Time: 11/03/24  6:35 AM  Result Value Ref Range   Glucose-Capillary 106 (H) 70 - 99 mg/dL    Comment: Glucose reference range applies only to samples taken after fasting for at least 8 hours.  Glucose, capillary     Status: Abnormal   Collection Time: 11/03/24 11:47 AM  Result Value Ref Range   Glucose-Capillary 171 (H) 70 - 99 mg/dL    Comment: Glucose reference range applies only to samples taken after fasting for at least 8 hours.   Comment 1 Notify RN   Glucose, capillary     Status: Abnormal   Collection Time: 11/03/24  5:06 PM  Result Value Ref Range   Glucose-Capillary 137 (H) 70 - 99 mg/dL    Comment: Glucose reference range applies only to samples taken after fasting for at least 8 hours.   Comment 1 Notify RN   CK     Status: None   Collection Time: 11/03/24  6:52 PM  Result Value Ref Range   Total CK 135 38 - 234 U/L    Comment: Performed at [facility], [address], [place], [place] [address]  Comprehensive metabolic panel     Status: Abnormal   Collection Time: 11/03/24  6:52 PM  Result Value Ref Range   Sodium 140 135 - 145 mmol/L   Potassium 3.4 (L) 3.5 - 5.1 mmol/L   Chloride 102 98 - 111 mmol/L   CO2 25 22 - 32 mmol/L   Glucose, Bld 182 (H) 70 - 99 mg/dL    Comment: Glucose reference range applies only to samples taken after fasting for at least 8 hours.   BUN 10 6 - 20 mg/dL   Creatinine, Ser 9.12 0.44 - 1.00 mg/dL   Calcium  9.8 8.9 - 10.3 mg/dL   Total Protein 7.9 6.5 - 8.1 g/dL   Albumin 4.1 3.5 - 5.0 g/dL   AST 15 15 - 41 U/L   ALT 10 0 - 44 U/L   Alkaline Phosphatase 72 38 - 126 U/L   Total  Bilirubin 0.5 0.0 - 1.2 mg/dL   GFR, Estimated >39 >39 mL/min    Comment: (NOTE) Calculated using the CKD-EPI Creatinine Equation (2021)    Anion gap 13 5 - 15    Comment: Performed at [facility], [address], [place], [place] [address]  Glucose, capillary     Status:  Abnormal   Collection Time: 11/03/24  9:50 PM  Result Value Ref Range   Glucose-Capillary 164 (H) 70 - 99 mg/dL    Comment: Glucose reference range applies only to samples taken after fasting for at least 8 hours.  Glucose, capillary     Status: Abnormal   Collection Time: 11/04/24  6:23 AM  Result Value Ref Range   Glucose-Capillary 105 (H) 70 - 99 mg/dL    Comment: Glucose reference range applies only to samples taken after fasting for at least 8 hours.    Blood Alcohol level:  Lab Results  Component Value Date   [facility] <15 03/10/2024    Metabolic Labs: Lab Results  Component Value Date   HGBA1C 9.7 (H) 10/29/2024   MPG 231.69 10/29/2024   MPG 248.91 08/03/2024   No results found for: PROLACTIN Lab Results  Component Value Date   CHOL 118 10/29/2024   TRIG 200 (H) 10/29/2024   HDL 30 (L) 10/29/2024   CHOLHDL 3.9 10/29/2024   VLDL 40 10/29/2024   LDLCALC 48 10/29/2024    Physical Findings: AIMS: No. No stiffness or cogwheeling noted in upper extremities today  CIWA:    COWS:     Psychiatric Specialty Exam:  Presentation  General Appearance: Appropriate for Environment; Casual  Eye Contact:Good  Speech:Slow; Clear and Coherent  Speech Volume:Normal  Handedness: unknown  Mood and Affect  Mood: doing alright  Affect:Appropriate; Full Range   Thought Process  Thought Processes: linear  Descriptions of Associations: intact  Orientation: oriented to self, year, place, month Thought Content:WDL  History of Schizophrenia/Schizoaffective disorder: yes Duration of Psychotic Symptoms: <6 months Ideas of Reference:None  Suicidal Thoughts: none Homicidal Thoughts: none  Sensorium  Memory:Immediate Fair; Recent Fair  Judgment:Fair  Insight:Fair   Executive Functions  Concentration:Fair  Attention Span:Fair  Recall:Fair  Fund of Knowledge:Fair  Language:Fair   Psychomotor Activity  Psychomotor Activity: decreased  Assets   Assets:Desire for Improvement; Housing; Research Scientist (medical); Resilience  Physical Exam ROS  Physical Exam Constitutional:      Appearance: the patient is not toxic-appearing.  Pulmonary:     Effort: Pulmonary effort is normal.  Neurological:     Mental Status: the patient is alert and oriented to person, place, and time.   Review of Systems  Respiratory:  Negative for shortness of breath.   Cardiovascular:  Negative for chest pain.  Gastrointestinal:  Negative for abdominal pain, constipation, diarrhea, nausea and vomiting.  Neurological:  Negative for headaches.   Blood pressure (!) 142/79, pulse 72, temperature (!) 97.4 F (36.3 C), temperature source Oral, resp. rate 19, height 5' 7 (1.702 m), weight 96.4 kg, SpO2 99%. Body mass index is 33.3 kg/m.  Treatment Plan Summary: Daily contact with patient to assess and evaluate symptoms and progress in treatment, Medication management, and Plan    ASSESSMENT: Patient is a 58 year old female with a history of bipolar I disorder (though unable to identify any hospitalizations for this) and multiple prior strokes, presenting with visual and auditory hallucinations. She has had several recent medical hospitalizations for altered mental status, previously thought to be due to hypoactive delirium secondary to recurrent UTIs  and medication polypharmacy. Possible etiologies for her current presentation include worsening of her primary psychiatric disorder, major neurocognitive disorder with psychotic features, and persistent low-level delirium. Her presentation appears predominantly neuropsychiatric in nature. Notably, her symptoms temporarily worsen following administration of Invega  Sustenna injections. This patient's presentation of visual hallucinations, parkinsonian features and antipsychotic sensitivity align mostly due to dementia with Lewy bodies (DLB). Patient's occasional presentations of akinesia potentially concerning for catatonia, though  patient's minimal activity fluctuates throughout the day and does not rigid posturing, and Ativan challenge at this time could potentially worsen presentation.   12/27: Patient main concern today is her nausea. She does not appear to be responding to internal stimuli and has insight that she was previously having hallucinations which led to admission which is reassuring for continued improvement. Will continue to monitor response to decrease in carbidopa /levidopa which was started due to concern of patient's anti-dopaminergic medications contributing to worsening of potential major neurocognitive disorder.   Diagnoses / Active Problems: # Schizoaffective disorder-depressive type # Major neurocognitive disorder ischemic type (8 x reported strokes) (r/o Lewy body dementia)   PLAN: Safety and Monitoring:  --  VOLUNTARY admission to inpatient psychiatric unit for safety, stabilization and treatment  -- Daily contact with patient to assess and evaluate symptoms and progress in treatment  -- Patient's case to be discussed in multi-disciplinary team meeting  -- Observation Level : 1:1 sitter (assist with ADLs)  -- Vital signs:  q12 hours  -- Precautions: suicide, elopement, and assault  2. Psychiatric Diagnoses and Treatment:  -- continue decreased Buspar  5 mg BID for anxiety -- continue carbidopa /levodopa  TID to 25-100 for suspected LBD and monitor movements and consider decrease - consider starting Remeron  7.5 at bedtime for appetite  -- Discontinued Latuda  80 mg on 10/30/2024 -- Recommend discontinuing Invega  LAI due to recent decline in physical and mental status after administration in early December.  -- The risks/benefits/side-effects/alternatives to this medication were discussed in detail with the patient and time was given for questions. The patient consents to medication trial.              -- Metabolic profile and EKG monitoring obtained while on an atypical antipsychotic  BMI: Body  mass index is 33.3 kg/m. TSH: No results found for: TSH Lipid Panel:  Lab Results  Component Value Date   CHOL 118 10/29/2024   HDL 30 (L) 10/29/2024   LDLCALC 48 10/29/2024   TRIG 200 (H) 10/29/2024   CHOLHDL 3.9 10/29/2024   HbgA1c:  Lab Results  Component Value Date   HGBA1C 9.7 (H) 10/29/2024   QTc: 440 (10/30/24)              -- Encouraged patient to participate in unit milieu and in scheduled group therapies   -- Short Term Goals: Ability to identify changes in lifestyle to reduce recurrence of condition will improve, Ability to verbalize feelings will improve, Ability to disclose and discuss suicidal ideas, Ability to demonstrate self-control will improve, Ability to identify and develop effective coping behaviors will improve, Ability to maintain clinical measurements within normal limits will improve, Compliance with prescribed medications will improve, and Ability to identify triggers associated with substance abuse/mental health issues will improve  -- Long Term Goals: Improvement in symptoms so as ready for discharge  Other PRNS:  albuterol , alum & mag hydroxide-simeth, ammonium lactate , ondansetron   -- As needed agitation protocol in-place   3. Medical Issues Being Addressed:   #Chronic pain / neuropathy -- Discontinued Lyrica  due to  sedation on 10/29/2024 -- continue Subutex  1 mg sublingual daily   #Asthma / Allergies -- Continue albuterol  inhaler as needed -- Continue Breo-Ellipta 1 puff daily -- Continue loratidine 10 mg daily   #HTN / HLD -- Continue losartan  100 mg daily -- Continue metoprolol  50 mg daily -- continue Crestor  20 mg daily    #low vit D - continue ergocalciferol  50K units q 7 days   #T2DM -- Continue metformin  1000 mg BID --AC nightly POCT CBG monitoring - HbA1c 9.7    #GERD / IBS -- Continue Protonix  40 mg daily -- Continue Linzess  290 mcg daily   #Nicotine  withdrawal - Patient in need of nicotine  replacement; nicotine  patch  14 mg / 24 hours ordered. Smoking cessation encouraged   4. Discharge Planning:   -- Social work and case management to assist with discharge planning and identification of hospital follow-up needs prior to discharge  -- Estimated LOS: 5-7 days  -- Discharge Concerns: Need to establish a safety plan; Medication compliance and effectiveness  -- Discharge Goals: Return home with outpatient referrals for mental health follow-up including medication management/psychotherapy   I certify that inpatient services furnished can reasonably be expected to improve the patient's condition.   This note was created using a voice recognition software as a result there may be grammatical errors inadvertently enclosed that do not reflect the nature of this encounter. Every attempt is made to correct such errors.   This case was discussed with attending Dr. Prentis who agrees with the above formulated treatment plan. Please see attending attestation for additional details.   Dr. Corean Minor, MD PGY-3, Psychiatry Residency  12/27/20259:18 AM

## 2024-11-04 NOTE — Progress Notes (Signed)
 Tour of Duty:  Prentice JINNY Angle, RN, 11/04/2024, Tour of Duty: 0700-1900  SI/HI/AVH: Denies  Self-Reported   Mood: Positive  Anxiety: Endorses Depression: Denies Irritability: Denies  Broset  Violence Prevention Guidelines *See Row Information*: Small Violence Risk interventions implemented   LBM  Last BM Date : 11/03/24   Pain: not present  Patient Refusals (including Rx): No  >>Shift Summary: Patient observed to be withdrawn and isolative on unit. Patient able to make needs known. Patient remain 1:1 for safety and ambulates with assistance in wheelchair. Patient observed to engage appropriately with staff and peers. Patient taking medications as prescribed. This shift, PRN medication requested or required. No reported or observed side effects to medication. No reported or observed agitation, aggression, or other acute emotional distress. Patient continues to report nausea, PRN Zofran  administered twice this shift. No additional reported or observed physical abnormalities or concerns.  Last Vitals  Vitals Weight: 96.4 kg Temp: (!) 97.4 F (36.3 C) Temp Source: Oral Pulse Rate: 69 Resp: 19 BP: 139/74 Patient Position: (not recorded)  Admission Type  Psych Admission Type (Psych Patients Only) Admission Status: Voluntary Date 72 hour document signed : (not recorded) Time 72 hour document signed : (not recorded) Provider Notified (First and Last Name) (see details for LINK to note): (not recorded)   Psychosocial Assessment  Psychosocial Assessment Patient Complaints: None Eye Contact: Fair Facial Expression: Anxious Affect: Appropriate to circumstance Speech: Soft, Slow Interaction: Isolative Motor Activity: Slow, Unsteady Appearance/Hygiene: Unremarkable Behavior Characteristics: Cooperative, Anxious Mood: Anxious   Aggressive Behavior  Targets: (not recorded)   Thought Process  Thought Process Coherency: Within Defined Limits Content: Within Defined  Limits Delusions: None reported or observed Perception: Within Defined Limits Hallucination: None reported or observed Judgment: Poor Confusion: None  Danger to Self/Others  Danger to Self Current suicidal ideation?: Denies Description of Suicide Plan: (not recorded) Self-Injurious Behavior: (not recorded) Agreement Not to Harm Self: (not recorded) Description of Agreement: (not recorded) Danger to Others: None reported or observed

## 2024-11-04 NOTE — Group Note (Unsigned)
 Date:  11/04/2024 Time:  8:46 PM  Group Topic/Focus:  Wrap-Up Group:   The focus of this group is to help patients review their daily goal of treatment and discuss progress on daily workbooks.     Participation Level:  {BHH PARTICIPATION OZCZO:77735}  Participation Quality:  {BHH PARTICIPATION QUALITY:22265}  Affect:  {BHH AFFECT:22266}  Cognitive:  {BHH COGNITIVE:22267}  Insight: {BHH Insight2:20797}  Engagement in Group:  {BHH ENGAGEMENT IN HMNLE:77731}  Modes of Intervention:  {BHH MODES OF INTERVENTION:22269}  Additional Comments:  ***  Alicia Mayo 11/04/2024, 8:46 PM

## 2024-11-04 NOTE — BHH Group Notes (Signed)
 Psychoeducational Group Note  Date:  11/04/2024 Time:  2000  Group Topic/Focus:  Wrap up group  Participation Level: Did Not Attend  Participation Quality:  Not Applicable  Affect:  Not Applicable  Cognitive:  Not Applicable  Insight:  Not Applicable  Engagement in Group: Not Applicable  Additional Comments:  Did not attend.   Lenora Manuelita RAMAN 11/04/2024, 8:59 PM

## 2024-11-04 NOTE — Group Note (Signed)
 LCSW Group Therapy Note 11/04/2024  10:00am - 11:00am  Type of Therapy and Topic:  Group Therapy: Slogans/Quotes To Live By  Participation Level:  Did Not Attend  Description of Group: In this group, patients identified a slogan or saying that they use often in their lives and discussed how it helps them.  Other group members reacted and talked about whether that will be something they can add to their repertoire.  CSW then shared a variety of inspiring and/or humorous quotes to be an encouragement.  These were discussed, particularly how they can increase feelings of having power and a voice in their own lives.  The topic of boundaries was raised numerous times which led to an exploration of the concept of having boundaries with ourselves.  We talked at length about the importance of deciding on boundaries, not just guessing, and of telling the other people involved what the boundaries are so that they can have a chance to observe them.  Therapeutic Goals: Patients will share a positive quote or saying that helps them manage hard times. Patients will identify how the quotes shared in group by themselves and other could help them to succeed upon discharge Patients will be able to acknowledge the common theme of powerlessness among the group members Patients will consider ways to take back their power where and when they are able, especially by establishing appropriate boundaries Patients will learn about deciding on boundaries, telling the other person about them, implementing them, then enforcing them if necessary.  Summary of Patient Progress:  Patient was invited to group, did not attend.   Therapeutic Modalities:   Processing  Elgie JINNY Crest, LCSW 11/04/2024  1:39 PM

## 2024-11-04 NOTE — BH Assessment (Signed)
(  Sleep Hours) - 10.25 (Any PRNs that were needed, meds refused, or side effects to meds)-  (Any disturbances and when (visitation, over night)- None (Concerns raised by the patient)- 2140 Pt C/O nausea. Zofran  4 mg given (SI/HI/AVH)- Denies

## 2024-11-04 NOTE — Plan of Care (Signed)
   Problem: Education: Goal: Emotional status will improve Outcome: Progressing Goal: Mental status will improve Outcome: Progressing

## 2024-11-04 NOTE — Group Note (Signed)
 Date:  11/04/2024 Time:  4:10 PM  Group Topic/Focus:  Wellness Toolbox:   The focus of this group is to discuss various aspects of wellness, balancing those aspects and exploring ways to increase the ability to experience wellness.  Patients will create a wellness toolbox for use upon discharge. Nursing Group   Participation Level:  Did Not Attend   Logan LITTIE Molly 11/04/2024, 4:10 PM

## 2024-11-05 DIAGNOSIS — F0392 Unspecified dementia, unspecified severity, with psychotic disturbance: Secondary | ICD-10-CM | POA: Diagnosis not present

## 2024-11-05 DIAGNOSIS — G8929 Other chronic pain: Secondary | ICD-10-CM | POA: Diagnosis not present

## 2024-11-05 LAB — GLUCOSE, CAPILLARY
Glucose-Capillary: 108 mg/dL — ABNORMAL HIGH (ref 70–99)
Glucose-Capillary: 194 mg/dL — ABNORMAL HIGH (ref 70–99)
Glucose-Capillary: 175 mg/dL — ABNORMAL HIGH (ref 70–99)
Glucose-Capillary: 155 mg/dL — ABNORMAL HIGH (ref 70–99)

## 2024-11-05 MED ORDER — LIDOCAINE 5 % EX PTCH
2.0000 | MEDICATED_PATCH | CUTANEOUS | Status: DC
  Administered 2024-11-05 – 2024-11-10 (×6): 2 via TRANSDERMAL
  Filled 2024-11-05 (×2): qty 2

## 2024-11-05 NOTE — Progress Notes (Signed)
 Tour of Duty:  Prentice JINNY Angle, RN, 11/05/2024, Tour of Duty: 0700-1900  SI/HI/AVH: Denies  Self-Reported   Mood: Positive  Anxiety: Denies Depression: Denies Irritability: Denies  Broset  Violence Prevention Guidelines *See Row Information*: Small Violence Risk interventions implemented   LBM  Last BM Date : 11/04/24   Pain: present, interventions include: scheduled Rx  Patient Refusals (including Rx): No  >>Shift Summary: Patient observed to be calm on unit. Patient able to make needs known. Patient observed to engage appropriately with staff and peers. Patient taking medications as prescribed. This shift, no PRN medication requested or required. No reported or observed side effects to medication. No reported or observed agitation, aggression, or other acute emotional distress. No reported or observed physical abnormalities or concerns. Patient remains 1:1 for safety and wheelchair use.   Last Vitals  Vitals Weight: 96.4 kg Temp: 97.6 F (36.4 C) Temp Source: Oral Pulse Rate: 82 Resp: 20 BP: 139/83 Patient Position: (not recorded)  Admission Type  Psych Admission Type (Psych Patients Only) Admission Status: Voluntary Date 72 hour document signed : (not recorded) Time 72 hour document signed : (not recorded) Provider Notified (First and Last Name) (see details for LINK to note): (not recorded)   Psychosocial Assessment  Psychosocial Assessment Patient Complaints: None Eye Contact: Fair Facial Expression: Anxious Affect: Appropriate to circumstance Speech: Soft, Slow Interaction: Isolative Motor Activity: Slow, Unsteady Appearance/Hygiene: Unremarkable Behavior Characteristics: Cooperative, Anxious Mood: Pleasant   Aggressive Behavior  Targets: (not recorded)   Thought Process  Thought Process Coherency: Within Defined Limits Content: Within Defined Limits Delusions: None reported or observed Perception: Within Defined Limits Hallucination: None  reported or observed Judgment: Poor Confusion: None  Danger to Self/Others  Danger to Self Current suicidal ideation?: Denies Description of Suicide Plan: (not recorded) Self-Injurious Behavior: (not recorded) Agreement Not to Harm Self: (not recorded) Description of Agreement: (not recorded) Danger to Others: None reported or observed

## 2024-11-05 NOTE — Plan of Care (Signed)
   Problem: Education: Goal: Knowledge of [facility] Education information/materials will improve Outcome: Progressing Goal: Emotional status will improve Outcome: Progressing Goal: Mental status will improve Outcome: Progressing

## 2024-11-05 NOTE — [facility] Group Notes (Signed)
 Adult Psychoeducational Group Note  Date:  11/05/2024 Time:  8:41 PM  Group Topic/Focus:  Wrap-Up Group:   The focus of this group is to help patients review their daily goal of treatment and discuss progress on daily workbooks.  Participation Level:  Did Not Attend  Alicia Mayo 11/05/2024, 8:41 PM

## 2024-11-05 NOTE — Plan of Care (Signed)
   Problem: Education: Goal: Emotional status will improve Outcome: Progressing Goal: Mental status will improve Outcome: Progressing

## 2024-11-05 NOTE — Progress Notes (Addendum)
 [facility] Inpatient Psychiatry Progress Note  Date: 11/05/2024 Patient: Alicia Mayo MRN: [id number]  Assessment and Plan: Alicia Mayo is a 59 y.o. female admitted for auditory loose nations and akinetic crisis, which appear to have been triggered in part by Invega  injection.  Patient did not respond to discontinuation of offending agents including holding Latuda  and LAI (although unclear when she caught her LAI injection exactly and appears to have been during November per pharmacy dispense records).  Patient did have response when started on Sinemet .  This supports the differential more towards neurocognitive sorter including parkinsonian given the parkinsonism, worsening antipsychotics, pill roll tremor, hallucinations of kids and cats, and vivid dreams which are most consistent with Lewy body dementia but may also represent another parkinsonian disorder such as Parkinson's disease with dementia, vascular dementia with parkinsonism, or atypical parkinsonian disorder.  Patient has not been experiencing any major side effects on the carbidopa  levodopa  except for some possible nausea, which is chronic for her anyway, and is not noted to be experiencing any worsening hallucinations or mania or other psychiatric concerns on the Sinemet .  Plan to continue current regimen and likely will recommend outpatient neurology follow-up for recommendations on whether to continue the Sinemet  or not.  Other differentials for presentation including things that could be comorbid with no con disorder include delirium, primary psychotic disorder.  Consider cutting back on Sinemet  but not recommended to go lower than 3 times daily if using.    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, with psychotic disturbance (HCC) Ddx includes Lewy body dementia, Parkinson's disease with dementia, vascular dementia with parkinsonism, atypical parkinsonian disorder, comorbid primary  psychotic disorder, comorbid chronic delirium - Continue Sinemet  IR 25-100 mg tablet 3 times daily - Continue buspar  5 mg twice daily for anxiety    #Chronic pain / neuropathy -- Decrease Lyrica  to 150 mg qhs -- Reduce Subutex  2 mg sublingual daily due to potential of excessive sedation and delirium   #Asthma / Allergies -- Albuterol  -- Breo-Ellipta -- Flonase  -- Loratidine   #HTN / HLD -- Losartan  100 mg daily -- Metoprolol  50 mg daily -- Crestor  5 mg daily   #T2DM -- Metformin  1000 mg BID   #GERD / IBS -- Protonix  -- Linzess    #Nicotine  withdrawal - Patient in need of nicotine  replacement; nicotine  patch 14 mg / 24 hours ordered. Smoking cessation encouraged  Risk Assessment - mild risk given medical conditions, history of severe mental illness and hospitalizations, difficulty in ADLs and protective factors include engagement in treatment, having caretakers at home  Discharge Planning Barriers to discharge: Assessing tolerability on Sinemet  including continued lack of hallucinations, mania, paranoia Estimated length of stay: Tuesday 12/30 Predicted Discharge location: Home with caretaker     Interval History and update:  Patient reports she is doing much better today.  Reports that she is still having some nausea but that this is chronic for her.  Reports that she has been able to manage it with Zofran  as she is on top of it.  Reports that she previously was on Phenergan and has seen a gastrointestinal doctor for this.  Reports that she is not sure but says she has been diagnosed with gastroparesis and also with other gastrointestinal issues.  Says that she has been having much improved mobility and she is very happy with what the medications have done for her in the hospital saying that she was able to walk yesterday which she has not done  in some time.  Reports she was also able to take shower yesterday.  Says that she has been sleeping great.  Reports that her appetite  has been okay but she was able to eat breakfast this morning despite the ongoing nausea but was potentially better this morning.  Reports that she took Zofran  yesterday but has not felt the need to take it since that time.  Reports that she is having a friend visit today.  Reports that she feels like she is at a point where she feels comfortable leaving hospital in the next day or so.     Physical Exam MSK/Neuro -remains in wheelchair and deferred assessing patient walking, per report patient was walking yesterday Mental Status Exam Appearance - Casually dressed, appropriate hygiene and grooming, in wheelchair Attitude - Calm, polite, not guarded Speech - normal volume, prosody, inflection Mood -happy hey Affect -  Thought Process - LLGD Thought Content - No delusional TC expressed SI/HI - Denies  Perceptions - Denies AVH; not RIS Judgement/Insight - Good  Fund of knowledge - WNL Language - No impairments      Lab Results:  Admission on 10/28/2024  Component Date Value Ref Range Status   Hgb A1c MFr Bld 10/29/2024 9.7 (H)  4.8 - 5.6 % Final   Mean Plasma Glucose 10/29/2024 231.69  mg/dL Final   Cholesterol 87/78/7974 118  0 - 200 mg/dL Final   Triglycerides 87/78/7974 200 (H)  <150 mg/dL Final   HDL 87/78/7974 30 (L)  >40 mg/dL Final   Total CHOL/HDL Ratio 10/29/2024 3.9  RATIO Final   VLDL 10/29/2024 40  0 - 40 mg/dL Final   LDL Cholesterol 10/29/2024 48  0 - 99 mg/dL Final   Glucose-Capillary 10/29/2024 183 (H)  70 - 99 mg/dL Final   Glucose-Capillary 10/29/2024 126 (H)  70 - 99 mg/dL Final   Comment 1 87/78/7974 Notify RN   Final   Comment 2 10/29/2024 Document in Chart   Final   Vit D, 1,25-Dihydroxy 10/30/2024 15.5 (L)  24.8 - 81.5 pg/mL Final   Glucose-Capillary 10/30/2024 118 (H)  70 - 99 mg/dL Final   Glucose-Capillary 10/30/2024 197 (H)  70 - 99 mg/dL Final   Glucose-Capillary 10/30/2024 149 (H)  70 - 99 mg/dL Final   Glucose-Capillary 10/31/2024 112 (H)  70 - 99  mg/dL Final   Comment 1 87/76/7974 Notify RN   Final   Comment 2 10/31/2024 Document in Chart   Final   Glucose-Capillary 10/31/2024 117 (H)  70 - 99 mg/dL Final   Glucose-Capillary 10/31/2024 112 (H)  70 - 99 mg/dL Final   Total CK 87/76/7974 395 (H)  38 - 234 U/L Final   Glucose-Capillary 10/31/2024 115 (H)  70 - 99 mg/dL Final   Glucose-Capillary 11/01/2024 126 (H)  70 - 99 mg/dL Final   Specimen Source 11/02/2024 URINE, CLEAN CATCH   Final   Color, Urine 11/02/2024 YELLOW  YELLOW Final   APPearance 11/02/2024 CLEAR  CLEAR Final   Specific Gravity, Urine 11/02/2024 1.012  1.005 - 1.030 Final   pH 11/02/2024 6.0  5.0 - 8.0 Final   Glucose, UA 11/02/2024 NEGATIVE  NEGATIVE mg/dL Final   Hgb urine dipstick 11/02/2024 NEGATIVE  NEGATIVE Final   Bilirubin Urine 11/02/2024 NEGATIVE  NEGATIVE Final   Ketones, ur 11/02/2024 NEGATIVE  NEGATIVE mg/dL Final   Protein, ur 87/74/7974 NEGATIVE  NEGATIVE mg/dL Final   Nitrite 87/74/7974 NEGATIVE  NEGATIVE Final   Leukocytes,Ua 11/02/2024 SMALL (A)  NEGATIVE Final  RBC / HPF 11/02/2024 0-5  0 - 5 RBC/hpf Final   WBC, UA 11/02/2024 0-5  0 - 5 WBC/hpf Final   Bacteria, UA 11/02/2024 NONE SEEN  NONE SEEN Final   Squamous Epithelial / HPF 11/02/2024 0-5  0 - 5 /HPF Final   Mucus 11/02/2024 PRESENT   Final   Hyaline Casts, UA 11/02/2024 PRESENT   Final   Glucose-Capillary 11/01/2024 171 (H)  70 - 99 mg/dL Final   Glucose-Capillary 11/01/2024 125 (H)  70 - 99 mg/dL Final   Glucose-Capillary 11/02/2024 117 (H)  70 - 99 mg/dL Final   Glucose-Capillary 11/02/2024 130 (H)  70 - 99 mg/dL Final   Comment 1 87/74/7974 Notify RN   Final   Glucose-Capillary 11/02/2024 156 (H)  70 - 99 mg/dL Final   Total CK 87/73/7974 135  38 - 234 U/L Final   Sodium 11/03/2024 140  135 - 145 mmol/L Final   Potassium 11/03/2024 3.4 (L)  3.5 - 5.1 mmol/L Final   Chloride 11/03/2024 102  98 - 111 mmol/L Final   CO2 11/03/2024 25  22 - 32 mmol/L Final   Glucose, Bld  11/03/2024 182 (H)  70 - 99 mg/dL Final   BUN 87/73/7974 10  6 - 20 mg/dL Final   Creatinine, Ser 11/03/2024 0.87  0.44 - 1.00 mg/dL Final   Calcium  11/03/2024 9.8  8.9 - 10.3 mg/dL Final   Total Protein 87/73/7974 7.9  6.5 - 8.1 g/dL Final   Albumin 87/73/7974 4.1  3.5 - 5.0 g/dL Final   AST 87/73/7974 15  15 - 41 U/L Final   ALT 11/03/2024 10  0 - 44 U/L Final   Alkaline Phosphatase 11/03/2024 72  38 - 126 U/L Final   Total Bilirubin 11/03/2024 0.5  0.0 - 1.2 mg/dL Final   GFR, Estimated 11/03/2024 >60  >60 mL/min Final   Anion gap 11/03/2024 13  5 - 15 Final   Glucose-Capillary 11/02/2024 163 (H)  70 - 99 mg/dL Final   Glucose-Capillary 11/03/2024 106 (H)  70 - 99 mg/dL Final   Glucose-Capillary 11/03/2024 171 (H)  70 - 99 mg/dL Final   Comment 1 87/73/7974 Notify RN   Final   Glucose-Capillary 11/03/2024 137 (H)  70 - 99 mg/dL Final   Comment 1 87/73/7974 Notify RN   Final   Glucose-Capillary 11/03/2024 164 (H)  70 - 99 mg/dL Final   Glucose-Capillary 11/04/2024 105 (H)  70 - 99 mg/dL Final   Glucose-Capillary 11/04/2024 156 (H)  70 - 99 mg/dL Final   Glucose-Capillary 11/04/2024 129 (H)  70 - 99 mg/dL Final   Glucose-Capillary 11/05/2024 108 (H)  70 - 99 mg/dL Final     Vitals: Blood pressure 139/83, pulse 82, temperature 97.6 F (36.4 C), temperature source Oral, resp. rate 20, height 5' 7 (1.702 m), weight 96.4 kg, SpO2 100%.    Justino Cornish, MD

## 2024-11-05 NOTE — Progress Notes (Signed)
(  Sleep Hours) -7.75  (Any PRNs that were needed, meds refused, or side effects to meds)-Zofran    (Any disturbances and when (visitation, over night)-none  (Concerns raised by the patient)- nausea  (SI/HI/AVH)-denies

## 2024-11-05 NOTE — Progress Notes (Signed)
(  Sleep Hours) - 9 (Any PRNs that were needed, meds refused, or side effects to meds)- none (Any disturbances and when (visitation, over night)- none reported (Concerns raised by the patient)- back and hip pain, applied hot pack and turned to opposite side (SI/HI/AVH)- denies all  Pt remains 1:1 due to not being able to complete ADLs by self.

## 2024-11-06 DIAGNOSIS — F0392 Unspecified dementia, unspecified severity, with psychotic disturbance: Secondary | ICD-10-CM | POA: Diagnosis not present

## 2024-11-06 LAB — GLUCOSE, CAPILLARY
Glucose-Capillary: 97 mg/dL (ref 70–99)
Glucose-Capillary: 231 mg/dL — ABNORMAL HIGH (ref 70–99)
Glucose-Capillary: 122 mg/dL — ABNORMAL HIGH (ref 70–99)
Glucose-Capillary: 154 mg/dL — ABNORMAL HIGH (ref 70–99)

## 2024-11-06 MED ORDER — INSULIN ASPART 100 UNIT/ML IJ SOLN
0.0000 [IU] | Freq: Three times a day (TID) | INTRAMUSCULAR | Status: DC
  Administered 2024-11-06 – 2024-11-08 (×4): 2 [IU] via SUBCUTANEOUS

## 2024-11-06 MED ORDER — LIDOCAINE 5 % EX PTCH
1.0000 | MEDICATED_PATCH | CUTANEOUS | Status: DC
  Administered 2024-11-06 – 2024-11-09 (×4): 1 via TRANSDERMAL
  Filled 2024-11-06 (×4): qty 1

## 2024-11-06 MED ORDER — LINAGLIPTIN 5 MG PO TABS: 5.0000 mg | ORAL_TABLET | Freq: Every day | ORAL | Status: DC

## 2024-11-06 MED ORDER — INSULIN ASPART 100 UNIT/ML IJ SOLN: 0.0000 [IU] | Freq: Every day | INTRAMUSCULAR | Status: DC

## 2024-11-06 MED ORDER — CARBIDOPA-LEVODOPA 25-100 MG PO TABS
1.0000 | ORAL_TABLET | Freq: Two times a day (BID) | ORAL | Status: DC
  Administered 2024-11-07: 1 via ORAL

## 2024-11-06 MED ORDER — LINAGLIPTIN 5 MG PO TABS
5.0000 mg | ORAL_TABLET | Freq: Every day | ORAL | Status: DC
  Administered 2024-11-06 – 2024-11-10 (×5): 5 mg via ORAL
  Filled 2024-11-06 (×5): qty 1

## 2024-11-06 NOTE — Group Note (Signed)
 Date:  11/06/2024 Time:  11:46 AM  Group Topic/Focus: Gut flora's impact on emotionally wellness. Making Healthy Choices:   The focus of this group is to help patients identify negative/unhealthy choices they were using prior to admission and identify positive/healthier coping strategies to replace them upon discharge.     Participation Level:  Active  Participation Quality:  Appropriate  Affect:  Appropriate  Cognitive:  Appropriate  Insight: Appropriate  Engagement in Group:  Engaged  Modes of Intervention:  Discussion and Education  Additional Comments:    Juliene CHRISTELLA Huddle 11/06/2024, 11:46 AM

## 2024-11-06 NOTE — Progress Notes (Signed)
(  Sleep Hours) -8.25  (Any PRNs that were needed, meds refused, or side effects to meds)- Zofran  4mg   (Any disturbances and when (visitation, over night)-none  (Concerns raised by the patient)- right shoulder pain. States uses tizanidine and morphine  at home  (SI/HI/AVH)-denies

## 2024-11-06 NOTE — Group Note (Signed)
 Date:  11/06/2024 Time:  9:34 AM  Group Topic/Focus: Goals and orientation Goals Group:   The focus of this group is to help patients establish daily goals to achieve during treatment and discuss how the patient can incorporate goal setting into their daily lives to aide in recovery. Orientation:   The focus of this group is to educate the patient on the purpose and policies of crisis stabilization and provide a format to answer questions about their admission.  The group details unit policies and expectations of patients while admitted.    Participation Level:  Minimal  Participation Quality:  Appropriate  Affect:  Appropriate  Cognitive:  Alert  Insight: Appropriate  Engagement in Group:  Engaged  Modes of Intervention:  Orientation  Additional Comments:    Alicia Mayo 11/06/2024, 9:34 AM

## 2024-11-06 NOTE — Group Note (Signed)
 Date:  11/06/2024 Time:  3:43 PM  Group Topic/Focus: Emotional wellness Emotional Education:   The focus of this group is to discuss what feelings/emotions are, and how they are experienced.    Participation Level:  Did Not Attend   Alicia Mayo 11/06/2024, 3:43 PM

## 2024-11-06 NOTE — Plan of Care (Signed)
  Problem: Activity: Goal: Interest or engagement in activities will improve Outcome: Progressing   Problem: Coping: Goal: Ability to verbalize frustrations and anger appropriately will improve Outcome: Progressing   Problem: Education: Goal: Mental status will improve Outcome: Progressing

## 2024-11-06 NOTE — Group Note (Signed)
 Date:  11/06/2024 Time:  4:49 PM  Group Topic/Focus: Sleep Occupational therapists address sleep with adult mental health patients as a core daily occupation that significantly affects emotional regulation, cognitive functioning, and engagement in meaningful activities. From an OT perspective, sleep difficulties are explored holistically by considering routines, environments, daytime activity levels, and the impact of mental health symptoms such as anxiety, low mood, or racing thoughts. Interventions focus on practical, person-centred strategies including establishing consistent sleep and wake routines, promoting balanced daytime activity, adapting the sleep environment, and supporting relaxation or sensory regulation techniques. Occupational therapists work collaboratively with the multidisciplinary team and use trauma-informed, flexible approaches to help individuals develop sustainable sleep habits that support recovery and overall occupational performance.    Participation Level:  Did Not Attend   Alicia Mayo 11/06/2024, 4:49 PM

## 2024-11-06 NOTE — Group Note (Signed)
 Date:  11/06/2024 Time:  8:56 PM  Group Topic/Focus:  Wrap-Up Group:   The focus of this group is to help patients review their daily goal of treatment and discuss progress on daily workbooks.  Alcoholics Anonymous (AA) Meeting    Participation Level:  Did Not Attend  Participation Quality:  N/A  Affect:  N/A  Cognitive:  N/A  Insight: None  Engagement in Group:  N/A  Modes of Intervention:  N/A  Additional Comments:  Patient did not attend  Eward Mace 11/06/2024, 8:56 PM

## 2024-11-06 NOTE — Plan of Care (Signed)
   Problem: Education: Goal: Knowledge of [facility] Education information/materials will improve Outcome: Progressing Goal: Emotional status will improve Outcome: Progressing Goal: Mental status will improve Outcome: Progressing

## 2024-11-06 NOTE — [facility] Group Notes (Signed)
 Alicia Mayo did not attend wrap up group

## 2024-11-06 NOTE — Group Note (Signed)
 Date:  11/06/2024 Time:  10:35 AM  Group Topic/Focus: recreational therapy Both exercise and music offer different but complementary benefits for mental health. Exercise boosts physical well-being and reduces stress, anxiety, and depression, while music provides emotional release, comfort, and can influence your mood and energy levels. When combined, they create a powerful mental health therapy routine.    Participation Level:  Active   Dolores HERO Cleofas Hudgins 11/06/2024, 10:35 AM

## 2024-11-06 NOTE — Inpatient Diabetes Management (Signed)
 Inpatient Diabetes Program Recommendations  AACE/ADA: New Consensus Statement on Inpatient Glycemic Control   Target Ranges:  Prepandial:   less than 140 mg/dL      Peak postprandial:   less than 180 mg/dL (1-2 hours)      Critically ill patients:  140 - 180 mg/dL    Latest Reference Range & Units 11/06/24 06:42 11/06/24 12:01  Glucose-Capillary 70 - 99 mg/dL 97 768 (H)    Latest Reference Range & Units 11/05/24 05:53 11/05/24 13:39 11/05/24 17:05 11/05/24 20:23  Glucose-Capillary 70 - 99 mg/dL 891 (H) 805 (H) 824 (H) 155 (H)   Review of Glycemic Control  Diabetes history: DM2 Outpatient Diabetes medications: Metformin  XR 1000 mg BID Current orders for Inpatient glycemic control: Metformin  XR 1000 mg BID, Novolog  0-15 units TID with meals, Novolog  0-5 units QHS  Inpatient Diabetes Program Recommendations:    Outpatient DM: At time of discharge, please consider discharging on Metformin  XR 1000 mg BID and Tradjenta  5 mg daily (low risk of causing hypoglycemia).    NOTE: Patient admitted 10/28/24 with auditory/visual hallucinations. Noted consult for discharge recommendations for DM medications. Patient has been inpatient for 9 days and glucose has ranged from 97-231 mg/dl over the past 9 days with Metformin  XR 1000 mg BID and Novolog  correction scale.  Would recommend adding an additional oral DM medication with low risk of hypoglycemia. Therefore, would recommend prescribing Metformin  XR 1000 mg BID and Tradjenta  5 mg daily.  Thanks, Earnie Gainer, RN, MSN, CDCES Diabetes Coordinator Inpatient Diabetes Program [phone] (Team Pager from 8am to 5pm)

## 2024-11-06 NOTE — Group Note (Signed)
 Date:  11/06/2024 Time:  4:08 PM  Group Topic/Focus: [place] Coping With Mental Health Crisis:   The purpose of this group is to help patients identify strategies for coping with mental health crisis.  Group discusses possible causes of crisis and ways to manage them effectively. Overcoming Stress:   The focus of this group is to define stress and help patients assess their triggers.    Participation Level:  Did Not Attend   Alicia Mayo 11/06/2024, 4:08 PM

## 2024-11-06 NOTE — Progress Notes (Addendum)
 [facility] Inpatient Psychiatry Progress Note  Date: 11/06/2024 Patient: Alicia Mayo MRN: [id number]  Assessment and Plan: THANIA WOODLIEF is a 58 y.o. female admitted for auditory loose nations and akinetic crisis, which appear to have been triggered in part by Invega  injection.  Patient did not respond to discontinuation of offending agents including holding Latuda  and LAI (although unclear when she caught her LAI injection exactly and appears to have been during November per pharmacy dispense records).  Patient did have response when started on Sinemet .  This supports the differential more towards neurocognitive sorter including parkinsonian given the parkinsonism, worsening antipsychotics, pill roll tremor, hallucinations of kids and cats, and vivid dreams which are most consistent with Lewy body dementia but may also represent another parkinsonian disorder such as Parkinson's disease with dementia, vascular dementia with parkinsonism, or atypical parkinsonian disorder.  Patient has not been experiencing any major side effects on the carbidopa  levodopa  except for some possible nausea, which is chronic for her anyway, and is not noted to be experiencing any worsening hallucinations or mania or other psychiatric concerns on the Sinemet .  Other differentials for presentation including things that could be comorbid with no con disorder include delirium, primary psychotic disorder.  Consider cutting back on Sinemet  but not recommended to go lower than 3 times daily if using.  Today patient is continuing to do well.  Plan to cut back on Sinemet  to assess her response at twice daily dosing.  Given concern for neurocog disorder such as parkinsonian d/o made referral to outpatient neurology for more thorough assessment. [name], patient's caretaker at home, will come see her tomorrow at visitation and will talk with us  on Wednesday to help determine if patient is at her baseline.  Will look  into new primary care doctor for patient per James's request.  Patient has A1c 9.7 and has had significantly elevated blood glucoses for which diabetes coordinator was consulted and recommended adding linagliptin  as second oral agent, as it has low risk of hypoglycemia.  Requires ongoing hospitalization to assess for no return of motor symptoms with tapering of Sinemet . Reports shoulder pain so added another lidocaine  patch.    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, with psychotic disturbance (HCC) Ddx includes Lewy body dementia, Parkinson's disease with dementia, vascular dementia with parkinsonism, atypical parkinsonian disorder, comorbid primary psychotic disorder, comorbid chronic delirium - Decrease Sinemet  IR 25-100 mg tablet from 3 to 2 times daily - Continue buspar  5 mg twice daily for anxiety - outpatient neurology referral    #Chronic pain / neuropathy --Continue Subutex  1 mg sublingual daily - lidocaine  patch x3 for three different affected joints   #Asthma / Allergies -- Albuterol  -- Breo-Ellipta -- Flonase  -- Loratidine   #HTN / HLD -- Losartan  100 mg daily -- Metoprolol  50 mg daily -- Crestor  5 mg daily   #T2DM -- Metformin  1000 mg BID -- Start linagliptin  5 mg once daily, per diabetes coordinator recommendation   #GERD / IBS -- Protonix  -- Linzess    #Nicotine  withdrawal - Patient in need of nicotine  replacement; nicotine  patch 14 mg / 24 hours ordered. Smoking cessation encouraged  Risk Assessment - minimal-mild risk given medical conditions, history of severe mental illness and hospitalizations, difficulty in ADLs and protective factors include engagement in treatment, having caretakers at home    Discharge Planning Barriers to discharge: pending response to cutting back sinemet ; assessing from caretaker if at baseline Estimated length of stay: Thurs 11/09/24 Predicted Discharge location:  Home with caretaker     Interval  History and update:  Patient reports that she is still continues to do well.  Reports that she feels like she is ready to leave.  Reports she is okay with us  talking with her caregivers before proceeding with discharge to help us  with determining her baseline.  Says that we can call [name] Betters who is a primary caregiver for her.  Reports that she is still continue to walk well.  Collateral 12/29, [name], [phone]: Reported that could visit her on Tues evening at visitation and we could talk on Wednesday regarding her baseline. Says he is concerned about her current primary care doctor and asked if we could find another.      Physical Exam MSK/Neuro -observe patient stand up unassisted from wheelchair walk with relatively normal gait, turn around, and walk back and sit back down to wheelchair all without assistance and without any major lack of stability.  Cranial nerves II through XII intact.  Strength is 4+ out of 5 and symmetric throughout.  No tremor present.  Coordination intact.  Alert and oriented to person, place, time.  Attention intact as evident by weekdays backwards.  Memory intact immediate and short term. Mental Status Exam Appearance - Casually dressed, appropriate hygiene and grooming, in wheelchair Attitude - Calm, polite, not guarded Speech - normal volume, prosody, inflection Mood -happy Affect -appropriate Thought Process - LLGD Thought Content - No delusional TC expressed SI/HI - Denies  Perceptions - Denies AVH; not RIS Judgement/Insight - Good  Fund of knowledge - WNL Language - No impairments      Lab Results:  Admission on 10/28/2024  Component Date Value Ref Range Status   Hgb A1c MFr Bld 10/29/2024 9.7 (H)  4.8 - 5.6 % Final   Mean Plasma Glucose 10/29/2024 231.69  mg/dL Final   Cholesterol 87/78/7974 118  0 - 200 mg/dL Final   Triglycerides 87/78/7974 200 (H)  <150 mg/dL Final   HDL 87/78/7974 30 (L)  >40 mg/dL Final   Total CHOL/HDL Ratio  10/29/2024 3.9  RATIO Final   VLDL 10/29/2024 40  0 - 40 mg/dL Final   LDL Cholesterol 10/29/2024 48  0 - 99 mg/dL Final   Glucose-Capillary 10/29/2024 183 (H)  70 - 99 mg/dL Final   Glucose-Capillary 10/29/2024 126 (H)  70 - 99 mg/dL Final   Comment 1 87/78/7974 Notify RN   Final   Comment 2 10/29/2024 Document in Chart   Final   Vit D, 1,25-Dihydroxy 10/30/2024 15.5 (L)  24.8 - 81.5 pg/mL Final   Glucose-Capillary 10/30/2024 118 (H)  70 - 99 mg/dL Final   Glucose-Capillary 10/30/2024 197 (H)  70 - 99 mg/dL Final   Glucose-Capillary 10/30/2024 149 (H)  70 - 99 mg/dL Final   Glucose-Capillary 10/31/2024 112 (H)  70 - 99 mg/dL Final   Comment 1 87/76/7974 Notify RN   Final   Comment 2 10/31/2024 Document in Chart   Final   Glucose-Capillary 10/31/2024 117 (H)  70 - 99 mg/dL Final   Glucose-Capillary 10/31/2024 112 (H)  70 - 99 mg/dL Final   Total CK 87/76/7974 395 (H)  38 - 234 U/L Final   Glucose-Capillary 10/31/2024 115 (H)  70 - 99 mg/dL Final   Glucose-Capillary 11/01/2024 126 (H)  70 - 99 mg/dL Final   Specimen Source 11/02/2024 URINE, CLEAN CATCH   Final   Color, Urine 11/02/2024 YELLOW  YELLOW Final   APPearance 11/02/2024 CLEAR  CLEAR Final  Specific Gravity, Urine 11/02/2024 1.012  1.005 - 1.030 Final   pH 11/02/2024 6.0  5.0 - 8.0 Final   Glucose, UA 11/02/2024 NEGATIVE  NEGATIVE mg/dL Final   Hgb urine dipstick 11/02/2024 NEGATIVE  NEGATIVE Final   Bilirubin Urine 11/02/2024 NEGATIVE  NEGATIVE Final   Ketones, ur 11/02/2024 NEGATIVE  NEGATIVE mg/dL Final   Protein, ur 87/74/7974 NEGATIVE  NEGATIVE mg/dL Final   Nitrite 87/74/7974 NEGATIVE  NEGATIVE Final   Leukocytes,Ua 11/02/2024 SMALL (A)  NEGATIVE Final   RBC / HPF 11/02/2024 0-5  0 - 5 RBC/hpf Final   WBC, UA 11/02/2024 0-5  0 - 5 WBC/hpf Final   Bacteria, UA 11/02/2024 NONE SEEN  NONE SEEN Final   Squamous Epithelial / HPF 11/02/2024 0-5  0 - 5 /HPF Final   Mucus 11/02/2024 PRESENT   Final   Hyaline Casts, UA  11/02/2024 PRESENT   Final   Glucose-Capillary 11/01/2024 171 (H)  70 - 99 mg/dL Final   Glucose-Capillary 11/01/2024 125 (H)  70 - 99 mg/dL Final   Glucose-Capillary 11/02/2024 117 (H)  70 - 99 mg/dL Final   Glucose-Capillary 11/02/2024 130 (H)  70 - 99 mg/dL Final   Comment 1 87/74/7974 Notify RN   Final   Glucose-Capillary 11/02/2024 156 (H)  70 - 99 mg/dL Final   Total CK 87/73/7974 135  38 - 234 U/L Final   Sodium 11/03/2024 140  135 - 145 mmol/L Final   Potassium 11/03/2024 3.4 (L)  3.5 - 5.1 mmol/L Final   Chloride 11/03/2024 102  98 - 111 mmol/L Final   CO2 11/03/2024 25  22 - 32 mmol/L Final   Glucose, Bld 11/03/2024 182 (H)  70 - 99 mg/dL Final   BUN 87/73/7974 10  6 - 20 mg/dL Final   Creatinine, Ser 11/03/2024 0.87  0.44 - 1.00 mg/dL Final   Calcium  11/03/2024 9.8  8.9 - 10.3 mg/dL Final   Total Protein 87/73/7974 7.9  6.5 - 8.1 g/dL Final   Albumin 87/73/7974 4.1  3.5 - 5.0 g/dL Final   AST 87/73/7974 15  15 - 41 U/L Final   ALT 11/03/2024 10  0 - 44 U/L Final   Alkaline Phosphatase 11/03/2024 72  38 - 126 U/L Final   Total Bilirubin 11/03/2024 0.5  0.0 - 1.2 mg/dL Final   GFR, Estimated 11/03/2024 >60  >60 mL/min Final   Anion gap 11/03/2024 13  5 - 15 Final   Glucose-Capillary 11/02/2024 163 (H)  70 - 99 mg/dL Final   Glucose-Capillary 11/03/2024 106 (H)  70 - 99 mg/dL Final   Glucose-Capillary 11/03/2024 171 (H)  70 - 99 mg/dL Final   Comment 1 87/73/7974 Notify RN   Final   Glucose-Capillary 11/03/2024 137 (H)  70 - 99 mg/dL Final   Comment 1 87/73/7974 Notify RN   Final   Glucose-Capillary 11/03/2024 164 (H)  70 - 99 mg/dL Final   Glucose-Capillary 11/04/2024 105 (H)  70 - 99 mg/dL Final   Glucose-Capillary 11/04/2024 156 (H)  70 - 99 mg/dL Final   Glucose-Capillary 11/04/2024 129 (H)  70 - 99 mg/dL Final   Glucose-Capillary 11/05/2024 108 (H)  70 - 99 mg/dL Final   Glucose-Capillary 11/05/2024 194 (H)  70 - 99 mg/dL Final   Glucose-Capillary 11/05/2024 175 (H)   70 - 99 mg/dL Final   Glucose-Capillary 11/05/2024 155 (H)  70 - 99 mg/dL Final   Glucose-Capillary 11/06/2024 97  70 - 99 mg/dL Final   Glucose-Capillary 11/06/2024 231 (  H)  70 - 99 mg/dL Final     Vitals: Blood pressure 139/83, pulse 82, temperature 97.6 F (36.4 C), temperature source Oral, resp. rate 20, height 5' 7 (1.702 m), weight 96.4 kg, SpO2 100%.    Justino Cornish, MD

## 2024-11-06 NOTE — Group Note (Signed)
 Recreation Therapy Group Note   Group Topic:Health and Wellness  Group Date: 11/06/2024 Start Time: 0935 End Time: 1000 Facilitators: Ziggy Reveles-McCall, LRT,CTRS Location: [address] Dayroom   Group Topic: Exercise/Wellness  Goal Area(s) Addresses:  Patient will verbalize benefit of exercise during group session. Patient will identify an exercise that can be completed post d/c. Patient will acknowledge benefits of exercise when used as a coping mechanism.   Behavioral Response: Observed  Intervention: Music  Activity: LRT and patients were to complete 3 rounds of exercises. Each patient took turns leading the group in the exercises of their choosing. Patients could take breaks or get water if needed.  Education: Physical Activity, Health and Wellness  Education Outcome: Acknowledges understanding/In group clarification offered/Needs additional education.    Affect/Mood: Appropriate   Participation Level: None   Participation Quality: None   Behavior: Appropriate and On-looking   Speech/Thought Process: Relevant   Insight: None   Judgement: None   Modes of Intervention: Music   Patient Response to Interventions:  Attentive   Education Outcome:  In group clarification offered    Clinical Observations/Individualized Feedback: Pt was bright and social. Pt was observant during group.     Plan: Continue to engage patient in RT group sessions 2-3x/week.   Safiyyah Vasconez-McCall, LRT,CTRS 11/06/2024 12:39 PM

## 2024-11-07 DIAGNOSIS — M25511 Pain in right shoulder: Secondary | ICD-10-CM | POA: Diagnosis present

## 2024-11-07 DIAGNOSIS — F0392 Unspecified dementia, unspecified severity, with psychotic disturbance: Secondary | ICD-10-CM | POA: Diagnosis not present

## 2024-11-07 LAB — GLUCOSE, CAPILLARY
Glucose-Capillary: 111 mg/dL — ABNORMAL HIGH (ref 70–99)
Glucose-Capillary: 144 mg/dL — ABNORMAL HIGH (ref 70–99)
Glucose-Capillary: 130 mg/dL — ABNORMAL HIGH (ref 70–99)
Glucose-Capillary: 119 mg/dL — ABNORMAL HIGH (ref 70–99)

## 2024-11-07 MED ORDER — BUPRENORPHINE HCL 2 MG SL SUBL
2.0000 mg | SUBLINGUAL_TABLET | Freq: Every day | SUBLINGUAL | Status: DC
  Administered 2024-11-08 – 2024-11-10 (×3): 2 mg via SUBLINGUAL
  Filled 2024-11-07 (×3): qty 1

## 2024-11-07 MED ORDER — BUPRENORPHINE HCL 2 MG SL SUBL
1.0000 mg | SUBLINGUAL_TABLET | Freq: Once | SUBLINGUAL | Status: AC
  Administered 2024-11-07: 1 mg via SUBLINGUAL
  Filled 2024-11-07: qty 1

## 2024-11-07 MED ORDER — CARBIDOPA-LEVODOPA 25-100 MG PO TABS
1.0000 | ORAL_TABLET | Freq: Three times a day (TID) | ORAL | Status: DC
  Administered 2024-11-07 – 2024-11-10 (×9): 1 via ORAL
  Filled 2024-11-07 (×9): qty 1

## 2024-11-07 NOTE — [facility] Group Notes (Signed)
 Adult Psychoeducational Group Note  Date:  11/07/2024 Time:  10:45 AM  Group Topic/Focus: Pet Therapy  Participation Level:  Did Not Attend Ronnell Puller 11/07/2024, 10:45 AM

## 2024-11-07 NOTE — Progress Notes (Signed)
 Tour of Duty:  Prentice JINNY Angle, RN, 11/07/2024, Tour of Duty: 0700-1900  SI/HI/AVH: Denies  Self-Reported   Mood: Positive  Anxiety: Denies Depression: Denies Irritability: Denies  Broset  Violence Prevention Guidelines *See Row Information*: Small Violence Risk interventions implemented   LBM  Last BM Date : 11/07/24 (ooo, like a hot dog!)   Pain: present, interventions include: scheduled Rx  Patient Refusals (including Rx): No  >>Shift Summary: Patient observed to be calm on unit. Patient able to make needs known. Patient observed to engage appropriately with staff and peers. Patient taking medications as prescribed. This shift, no PRN medication requested or required. No reported or observed side effects to medication. No reported or observed agitation, aggression, or other acute emotional distress. Patient reports severe R shoulder pain, starting at 12/10 down to 8/10 this evening. Patient ambulates with assistance with wheelchair. No additional reported or observed physical abnormalities or concerns.   Last Vitals  Vitals Weight: 96.4 kg Temp: 98.2 F (36.8 C) Temp Source: Oral Pulse Rate: 77 Resp: 20 BP: (!) 149/93 (Rn notified) Patient Position: (not recorded)  Admission Type  Psych Admission Type (Psych Patients Only) Admission Status: Voluntary Date 72 hour document signed : (not recorded) Time 72 hour document signed : (not recorded) Provider Notified (First and Last Name) (see details for LINK to note): (not recorded)   Psychosocial Assessment  Psychosocial Assessment Patient Complaints: None Eye Contact: Fair Facial Expression: Other (Comment) (WDL) Affect: Appropriate to circumstance Speech: Slow, Soft, Slurred Interaction: Assertive Motor Activity: Slow, Unsteady Appearance/Hygiene: Unremarkable Behavior Characteristics: Cooperative, Appropriate to situation Mood: Pleasant   Aggressive Behavior  Targets: (not recorded)   Thought  Process  Thought Process Coherency: Within Defined Limits Content: Within Defined Limits Delusions: None reported or observed Perception: Within Defined Limits Hallucination: None reported or observed Judgment: Limited Confusion: None  Danger to Self/Others  Danger to Self Current suicidal ideation?: Denies Description of Suicide Plan: (not recorded) Self-Injurious Behavior: (not recorded) Agreement Not to Harm Self: (not recorded) Description of Agreement: (not recorded) Danger to Others: None reported or observed

## 2024-11-07 NOTE — Group Note (Signed)
 LCSW Group Therapy Note   Group Date: 11/07/2024 Start Time: 1100 End Time: 1200   Participation:  patient was present.  She listened and was respectful but didn't participate in the discussion.   Type of Therapy:  Group Therapy  Topic:  Healing Flames: Navigating Anger with Compassion  Objective:  Foster self-awareness and promote compassion toward oneself and others when dealing with anger.  Goals:  Help participants understand the underlying emotions and needs fueling anger. Provide coping strategies for healthier emotional expression and anger management.  Summary: This session explored anger as a volcano--an explosion driven by deeper feelings and unmet needs. Participants learned to identify anger triggers and underlying emotions, then practiced coping strategies like deep breathing, physical activity, and journaling. The group discussed healthy ways to manage anger before it escalates, using both personal reflection and shared experiences.  Therapeutic Modalities: Cognitive Behavioral Therapy (CBT): Challenging thoughts that fuel anger. Mindfulness: Increasing awareness of emotions and sensations.   Alicia Mayo O Trevon Strothers, LCSWA 11/07/2024  12:26 PM

## 2024-11-07 NOTE — Group Note (Signed)
 Date:  11/07/2024 Time:  6:03 PM  Group Topic/Focus:  Overcoming Stress:   The focus of this group is to define stress and help patients assess their triggers. Managing Stress    Participation Level:  Did Not Attend   Inocente PARAS Kashlyn Salinas 11/07/2024, 6:03 PM

## 2024-11-07 NOTE — [facility] Group Notes (Signed)
 Adult Psychoeducational Group Note  Date:  11/07/2024 Time:  1:02 PM  Group Topic/Focus: Social Work Group  Participation Level:  Active  Participation Quality:  Attentive  Affect:  Appropriate  Cognitive:  Appropriate  Insight: Appropriate  Engagement in Group:  Engaged  Modes of Intervention:  Activity  Additional Comments:   Omega Slager 11/07/2024, 1:02 PM

## 2024-11-07 NOTE — Progress Notes (Addendum)
 [facility] Inpatient Psychiatry Progress Note  Date: 11/07/2024 Patient: Alicia Mayo MRN: [id number]  Assessment and Plan: KAMILLAH DIDONATO is a 58 y.o. female admitted for auditory loose nations and akinetic crisis, which appear to have been triggered in part by Invega  injection.  Patient did not respond to discontinuation of offending agents including holding Latuda  and LAI (although unclear when she caught her LAI injection exactly and appears to have been during November per pharmacy dispense records).  Patient did improvement in motor symptoms when started on Sinemet .  This supports the differential more towards neurocognitive sorter including parkinsonian given the parkinsonism, worsening antipsychotics, pill roll tremor, hallucinations of kids and cats, and vivid dreams which are most consistent with Lewy body dementia but may also represent another parkinsonian disorder such as Parkinson's disease with dementia, vascular dementia with parkinsonism, or atypical parkinsonian disorder.  Patient has not been experiencing any major side effects on the carbidopa  levodopa  except for some possible nausea, which is chronic for her anyway, and is not noted to be experiencing any worsening hallucinations or mania or other psychiatric concerns on the Sinemet .  Other differentials for presentation including things that could be comorbid with no con disorder include delirium, primary psychotic disorder.  Patient has psychiatry and neurology follow-up at discharge.  Linagliptin  was added as second oral diabetes agent given patient's A1c at 9.7, per recommendation of diabetes coordinator.  Patient was trialed off Sinemet  by tapering down to twice daily from 3 times daily for which she started having worsening motor symptoms including increased bradykinesia and stiffness in joints.  Plan increase back to 3 times daily and allow patient to determine next dose on Sinemet , likely neurology which has  been referred.  Plan for [name] her caretaker to visit tonight and will talk tomorrow morning about how she is doing.  Furthermore patient is having a lot of right shoulder pain today.  Provocative testing suggest rotator cuff pathology for which we will make a orthopedic referral at discharge.  For pain she is experiencing will increase her Suboxone from 1 to 2 mg, given she is listed as being allergic to nearly every pain medication.      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, with psychotic disturbance (HCC) Ddx includes Lewy body dementia, Parkinson's disease with dementia, vascular dementia with parkinsonism, atypical parkinsonian disorder, comorbid primary psychotic disorder, comorbid chronic delirium - Increase Sinemet  IR 25-100 mg tablet back to three times daily - Continue buspar  5 mg twice daily for anxiety - outpatient neurology referral    #Chronic pain / neuropathy # R shoulder pain -- Increase Subutex  from 1 to 2 mg sublingual daily - lidocaine  patch x3 for three different affected joints --Outpatient orthopedic referral for right rotator cuff injury   #Asthma / Allergies -- Albuterol  -- Breo-Ellipta -- Flonase  -- Loratidine   #HTN / HLD -- Losartan  100 mg daily -- Metoprolol  50 mg daily -- Crestor  5 mg daily   #T2DM -- Metformin  1000 mg BID -- Added linagliptin  5 mg once daily, per diabetes coordinator recommendation   #GERD / IBS -- Protonix  -- Linzess    #Nicotine  withdrawal - Patient in need of nicotine  replacement; nicotine  patch 14 mg / 24 hours ordered. Smoking cessation encouraged  Risk Assessment - minimal-mild risk given medical conditions, history of severe mental illness and hospitalizations, difficulty in ADLs and protective factors include engagement in treatment, having caretakers at home    Discharge Planning Barriers to discharge: pending response to  cutting back sinemet ; assessing from caretaker if at  baseline Estimated length of stay: Thurs 11/09/24 Predicted Discharge location: Home with caretaker     Interval History and update:  Patient reports that she is doing poorly today and that she is having a lot of pain.     Physical Exam [facility]/Neuro -positive provocative testing for rotator cuff pathology.  Right shoulder abduction painful up and until 90 degrees and cannot go past 90 degrees due to physical limitations.  No significant pain to palpation right shoulder.  Alert and oriented to person, place, time.  Recalls 2 of 3 objects in 3 minutes.  Attention intact as evident by stating months backwards.  Passive range of motion reveals stiffness in bilateral upper extremities.  Patient is able to ambulate normally including standing up and sitting back down with walker but may be bradykinetic than previous days.  Mental Status Exam Appearance - Casually dressed, appropriate hygiene and grooming, in wheelchair Attitude - Calm, polite, not guarded Speech - normal volume, prosody, inflection Mood -I am hurting Affect -appropriate, dysphoric due to pain Thought Process - LLGD Thought Content - No delusional TC expressed SI/HI - Denies  Perceptions - Denies AVH; not RIS Judgement/Insight - Good  Fund of knowledge - WNL Language - No impairments      Lab Results:  Admission on 10/28/2024  Component Date Value Ref Range Status   Hgb A1c MFr Bld 10/29/2024 9.7 (H)  4.8 - 5.6 % Final   Mean Plasma Glucose 10/29/2024 231.69  mg/dL Final   Cholesterol 87/78/7974 118  0 - 200 mg/dL Final   Triglycerides 87/78/7974 200 (H)  <150 mg/dL Final   HDL 87/78/7974 30 (L)  >40 mg/dL Final   Total CHOL/HDL Ratio 10/29/2024 3.9  RATIO Final   VLDL 10/29/2024 40  0 - 40 mg/dL Final   LDL Cholesterol 10/29/2024 48  0 - 99 mg/dL Final   Glucose-Capillary 10/29/2024 183 (H)  70 - 99 mg/dL Final   Glucose-Capillary 10/29/2024 126 (H)  70 - 99 mg/dL Final   Comment 1 87/78/7974 Notify RN   Final    Comment 2 10/29/2024 Document in Chart   Final   Vit D, 1,25-Dihydroxy 10/30/2024 15.5 (L)  24.8 - 81.5 pg/mL Final   Glucose-Capillary 10/30/2024 118 (H)  70 - 99 mg/dL Final   Glucose-Capillary 10/30/2024 197 (H)  70 - 99 mg/dL Final   Glucose-Capillary 10/30/2024 149 (H)  70 - 99 mg/dL Final   Glucose-Capillary 10/31/2024 112 (H)  70 - 99 mg/dL Final   Comment 1 87/76/7974 Notify RN   Final   Comment 2 10/31/2024 Document in Chart   Final   Glucose-Capillary 10/31/2024 117 (H)  70 - 99 mg/dL Final   Glucose-Capillary 10/31/2024 112 (H)  70 - 99 mg/dL Final   Total CK 87/76/7974 395 (H)  38 - 234 U/L Final   Glucose-Capillary 10/31/2024 115 (H)  70 - 99 mg/dL Final   Glucose-Capillary 11/01/2024 126 (H)  70 - 99 mg/dL Final   Specimen Source 11/02/2024 URINE, CLEAN CATCH   Final   Color, Urine 11/02/2024 YELLOW  YELLOW Final   APPearance 11/02/2024 CLEAR  CLEAR Final   Specific Gravity, Urine 11/02/2024 1.012  1.005 - 1.030 Final   pH 11/02/2024 6.0  5.0 - 8.0 Final   Glucose, UA 11/02/2024 NEGATIVE  NEGATIVE mg/dL Final   Hgb urine dipstick 11/02/2024 NEGATIVE  NEGATIVE Final   Bilirubin Urine 11/02/2024 NEGATIVE  NEGATIVE Final   Ketones, ur  11/02/2024 NEGATIVE  NEGATIVE mg/dL Final   Protein, ur 87/74/7974 NEGATIVE  NEGATIVE mg/dL Final   Nitrite 87/74/7974 NEGATIVE  NEGATIVE Final   Leukocytes,Ua 11/02/2024 SMALL (A)  NEGATIVE Final   RBC / HPF 11/02/2024 0-5  0 - 5 RBC/hpf Final   WBC, UA 11/02/2024 0-5  0 - 5 WBC/hpf Final   Bacteria, UA 11/02/2024 NONE SEEN  NONE SEEN Final   Squamous Epithelial / HPF 11/02/2024 0-5  0 - 5 /HPF Final   Mucus 11/02/2024 PRESENT   Final   Hyaline Casts, UA 11/02/2024 PRESENT   Final   Glucose-Capillary 11/01/2024 171 (H)  70 - 99 mg/dL Final   Glucose-Capillary 11/01/2024 125 (H)  70 - 99 mg/dL Final   Glucose-Capillary 11/02/2024 117 (H)  70 - 99 mg/dL Final   Glucose-Capillary 11/02/2024 130 (H)  70 - 99 mg/dL Final   Comment 1 87/74/7974  Notify RN   Final   Glucose-Capillary 11/02/2024 156 (H)  70 - 99 mg/dL Final   Total CK 87/73/7974 135  38 - 234 U/L Final   Sodium 11/03/2024 140  135 - 145 mmol/L Final   Potassium 11/03/2024 3.4 (L)  3.5 - 5.1 mmol/L Final   Chloride 11/03/2024 102  98 - 111 mmol/L Final   CO2 11/03/2024 25  22 - 32 mmol/L Final   Glucose, Bld 11/03/2024 182 (H)  70 - 99 mg/dL Final   BUN 87/73/7974 10  6 - 20 mg/dL Final   Creatinine, Ser 11/03/2024 0.87  0.44 - 1.00 mg/dL Final   Calcium  11/03/2024 9.8  8.9 - 10.3 mg/dL Final   Total Protein 87/73/7974 7.9  6.5 - 8.1 g/dL Final   Albumin 87/73/7974 4.1  3.5 - 5.0 g/dL Final   AST 87/73/7974 15  15 - 41 U/L Final   ALT 11/03/2024 10  0 - 44 U/L Final   Alkaline Phosphatase 11/03/2024 72  38 - 126 U/L Final   Total Bilirubin 11/03/2024 0.5  0.0 - 1.2 mg/dL Final   GFR, Estimated 11/03/2024 >60  >60 mL/min Final   Anion gap 11/03/2024 13  5 - 15 Final   Glucose-Capillary 11/02/2024 163 (H)  70 - 99 mg/dL Final   Glucose-Capillary 11/03/2024 106 (H)  70 - 99 mg/dL Final   Glucose-Capillary 11/03/2024 171 (H)  70 - 99 mg/dL Final   Comment 1 87/73/7974 Notify RN   Final   Glucose-Capillary 11/03/2024 137 (H)  70 - 99 mg/dL Final   Comment 1 87/73/7974 Notify RN   Final   Glucose-Capillary 11/03/2024 164 (H)  70 - 99 mg/dL Final   Glucose-Capillary 11/04/2024 105 (H)  70 - 99 mg/dL Final   Glucose-Capillary 11/04/2024 156 (H)  70 - 99 mg/dL Final   Glucose-Capillary 11/04/2024 129 (H)  70 - 99 mg/dL Final   Glucose-Capillary 11/05/2024 108 (H)  70 - 99 mg/dL Final   Glucose-Capillary 11/05/2024 194 (H)  70 - 99 mg/dL Final   Glucose-Capillary 11/05/2024 175 (H)  70 - 99 mg/dL Final   Glucose-Capillary 11/05/2024 155 (H)  70 - 99 mg/dL Final   Glucose-Capillary 11/06/2024 97  70 - 99 mg/dL Final   Glucose-Capillary 11/06/2024 231 (H)  70 - 99 mg/dL Final   Glucose-Capillary 11/06/2024 122 (H)  70 - 99 mg/dL Final   Glucose-Capillary 11/06/2024 154  (H)  70 - 99 mg/dL Final   Glucose-Capillary 11/07/2024 111 (H)  70 - 99 mg/dL Final   Glucose-Capillary 11/07/2024 144 (H)  70 - 99  mg/dL Final     Vitals: Blood pressure 132/63, pulse 71, temperature 98.2 F (36.8 C), temperature source Oral, resp. rate 20, height 5' 7 (1.702 m), weight 96.4 kg, SpO2 99%.    Justino Cornish, MD    Total Time Spent in Direct Patient Care:  I personally spent 30 minutes on the unit in direct patient care. The direct patient care time included face-to-face time with the patient, reviewing the patient's chart, communicating with other professionals, and coordinating care.   I have independently evaluated the patient during a face-to-face assessment. I reviewed the patient's chart, and I participated in key portions of the service. I discussed the case with the Resident, and I agree with the assessment and plan of care as documented in the Resident's note, as addended by me or notated below:   A/o x 4 with intact attention. No psychotic features. Bradykinesia seems slightly worse today, will resume Sinemet  TID. Will be assessed by caretaker tomorrow and will discuss D/C for Thursday.  Lamar Slain DO Psychiatrist

## 2024-11-07 NOTE — Group Note (Deleted)
 Date:  11/07/2024 Time:  4:38 PM  Group Topic/Focus:  Emotional Education:   The focus of this group is to discuss what feelings/emotions are, and how they are experienced. Wellness Toolbox:   The focus of this group is to discuss various aspects of wellness, balancing those aspects and exploring ways to increase the ability to experience wellness.  Patients will create a wellness toolbox for use upon discharge.     Participation Level:  {B[facility] PARTICIPATION OZCZO:77735}  Participation Quality:  {BHH PARTICIPATION QUALITY:22265}  Affect:  {BHH AFFECT:22266}  Cognitive:  {BHH COGNITIVE:22267}  Insight: {BHH Insight2:20797}  Engagement in Group:  {BHH ENGAGEMENT IN HMNLE:77731}  Modes of Intervention:  {BHH MODES OF INTERVENTION:22269}  Additional Comments:  ***  Amandeep Nesmith N Marvena Tally 11/07/2024, 4:38 PM

## 2024-11-07 NOTE — Plan of Care (Signed)
   Problem: Education: Goal: Emotional status will improve Outcome: Progressing Goal: Mental status will improve Outcome: Progressing

## 2024-11-07 NOTE — [facility] Group Notes (Signed)
 Pihu did not attend wrap up group

## 2024-11-07 NOTE — B[facility] Group Notes (Signed)
 Adult Psychoeducational Group Note  Date:  11/07/2024 Time:  3:03 PM  Group Topic/Focus: Peer support group  Participation Level:  Did Not Attend Alicia Mayo 11/07/2024, 3:03 PM

## 2024-11-07 NOTE — Group Note (Signed)
 Recreation Therapy Group Note   Group Topic:Animal Assisted Therapy   Group Date: 11/07/2024 Start Time: 9049 End Time: 1030 Facilitators: Janal Haak-McCall, LRT,CTRS Location: [address] Dayroom   Animal-Assisted Activity (AAA) Program Checklist/Progress Notes Patient Eligibility Criteria Checklist & Daily Group note for Rec Tx Intervention  AAA/T Program Assumption of Risk Form signed by Patient/ or Parent Legal Guardian Yes  Patient understands his/her participation is voluntary Yes  Behavioral Response:    Education: Charity Fundraiser, Appropriate Animal Interaction   Education Outcome: Acknowledges education.    Affect/Mood: N/A   Participation Level: Did not attend    Clinical Observations/Individualized Feedback:      Plan: Continue to engage patient in RT group sessions 2-3x/week.   Antonin Meininger-McCall, LRT,CTRS 11/07/2024 12:58 PM

## 2024-11-07 NOTE — B[facility] Group Notes (Signed)
 Adult Psychoeducational Group Note  Date:  11/07/2024 Time:  9:37 AM  Group Topic/Focus:  Goals Group:   The focus of this group is to help patients establish daily goals to achieve during treatment and discuss how the patient can incorporate goal setting into their daily lives to aide in recovery. Orientation:   The focus of this group is to educate the patient on the purpose and policies of crisis stabilization and provide a format to answer questions about their admission.  The group details unit policies and expectations of patients while admitted.  Participation Level:  Did Not Attend  Participation Quality:    Affect:    Cognitive:    Insight:   Engagement in Group:    Modes of Intervention:    Additional Comments:    Jamelle Cassondra KIDD 11/07/2024, 9:37 AM

## 2024-11-08 ENCOUNTER — Encounter (HOSPITAL_COMMUNITY): Payer: Self-pay

## 2024-11-08 DIAGNOSIS — F0392 Unspecified dementia, unspecified severity, with psychotic disturbance: Secondary | ICD-10-CM | POA: Diagnosis not present

## 2024-11-08 LAB — GLUCOSE, CAPILLARY
Glucose-Capillary: 133 mg/dL — ABNORMAL HIGH (ref 70–99)
Glucose-Capillary: 126 mg/dL — ABNORMAL HIGH (ref 70–99)
Glucose-Capillary: 106 mg/dL — ABNORMAL HIGH (ref 70–99)
Glucose-Capillary: 122 mg/dL — ABNORMAL HIGH (ref 70–99)

## 2024-11-08 NOTE — Group Note (Signed)
 Date:  11/08/2024 Time:  4:39 PM  Group Topic/Focus:  Building Self Esteem:   The Focus of this group is helping patients become aware of the effects of self-esteem on their lives, the things they and others do that enhance or undermine their self-esteem, seeing the relationship between their level of self-esteem and the choices they make and learning ways to enhance self-esteem. Conflict Resolution:   The focus of this group is to discuss the conflict resolution process and how it may be used upon discharge. Healthy Communication:   The focus of this group is to discuss communication, barriers to communication, as well as healthy ways to communicate with others.    Participation Level:  Did Not Attend    Alicia Mayo 11/08/2024, 4:39 PM

## 2024-11-08 NOTE — Group Note (Signed)
 Date:  11/08/2024 Time:  10:04 AM  Group Topic/Focus: Recreational Therapy  Turning the page into the new year    Participation Level:  Did Not Attend   Dolores CHRISTELLA Fredericks 11/08/2024, 10:04 AM

## 2024-11-08 NOTE — Group Note (Signed)
 Date:  11/08/2024 Time:  9:48 AM  Group Topic/Focus: goals and orientation Goals Group:   The focus of this group is to help patients establish daily goals to achieve during treatment and discuss how the patient can incorporate goal setting into their daily lives to aide in recovery. Orientation:   The focus of this group is to educate the patient on the purpose and policies of crisis stabilization and provide a format to answer questions about their admission.  The group details unit policies and expectations of patients while admitted.    Participation Level:  Did Not Attend    Dolores CHRISTELLA Fredericks 11/08/2024, 9:48 AM

## 2024-11-08 NOTE — Plan of Care (Signed)
 Patient's caregiver, [name], came to the hospital to get her pocketbook. Patient was agreeable to this and other items in her storage locker. This provider gave all items in patient's locker to [name], including her pocketbook, phone, and other miscellaneous things (hat, cigarettes).

## 2024-11-08 NOTE — Progress Notes (Signed)
" °   11/08/24 2030  Psych Admission Type (Psych Patients Only)  Admission Status Voluntary  Psychosocial Assessment  Patient Complaints None  Eye Contact Fair  Facial Expression Other (Comment) (WDL)  Affect Appropriate to circumstance  Speech Soft;Slow;Logical/coherent  Interaction Assertive  Motor Activity Slow;Unsteady  Appearance/Hygiene Unremarkable  Behavior Characteristics Cooperative;Appropriate to situation  Mood Pleasant  Thought Process  Coherency WDL  Content WDL  Delusions None reported or observed  Perception WDL  Hallucination None reported or observed  Judgment Limited  Confusion None  Danger to Self  Current suicidal ideation? Denies  Danger to Others  Danger to Others None reported or observed    "

## 2024-11-08 NOTE — Plan of Care (Signed)
   Problem: Education: Goal: Emotional status will improve Outcome: Progressing Goal: Mental status will improve Outcome: Progressing   Problem: Activity: Goal: Interest or engagement in activities will improve Outcome: Progressing

## 2024-11-08 NOTE — Group Note (Signed)
 Recreation Therapy Group Note   Group Topic:Stress Management  Group Date: 11/08/2024 Start Time: 0930 End Time: 9047 Facilitators: Vantasia Pinkney-McCall, LRT,CTRS Location: [address] Dayroom   Group Topic: Stress Management  Goal Area(s) Addresses:  Patient will identify positive stress management techniques. Patient will identify benefits of using stress management post d/c.  Behavioral Response:   Intervention: Let's Meditate App  Activity: Turning the Page. LRT played a meditation that focused on reflecting on the year that's passed and planting seeds of intention for the beginning of the new year.  The meditation also wanted patients to focus on not expecting changes to happen immediately but to treat each moment like taking a breath.    Education:  Stress Management, Discharge Planning.   Education Outcome: Acknowledges Education   Affect/Mood: N/A   Participation Level: Did not attend    Clinical Observations/Individualized Feedback:      Plan: Continue to engage patient in RT group sessions 2-3x/week.   Kannon Baum-McCall, LRT,CTRS 11/08/2024 1:07 PM

## 2024-11-08 NOTE — Plan of Care (Signed)
   Problem: Activity: Goal: Interest or engagement in activities will improve Outcome: Progressing   Problem: Coping: Goal: Ability to verbalize frustrations and anger appropriately will improve Outcome: Progressing   Problem: Safety: Goal: Periods of time without injury will increase Outcome: Progressing

## 2024-11-08 NOTE — Progress Notes (Signed)
 [facility] Inpatient Psychiatry Progress Note  Date: 11/08/2024 Patient: Alicia Mayo MRN: [id number]  Assessment and Plan: Alicia Mayo is a 58 y.o. female admitted for auditory loose nations and akinetic crisis, which appear to have been triggered in part by Invega  injection.  Patient did not respond to discontinuation of offending agents including holding Latuda  and LAI (although unclear when she caught her LAI injection exactly and appears to have been during November per pharmacy dispense records).  Patient did improvement in motor symptoms when started on Sinemet .  This supports the differential more towards neurocognitive sorter including parkinsonian given the parkinsonism, worsening antipsychotics, pill roll tremor, hallucinations of kids and cats, and vivid dreams which are most consistent with Lewy body dementia but may also represent another parkinsonian disorder such as Parkinson's disease with dementia, vascular dementia with parkinsonism, or atypical parkinsonian disorder.  Patient has not been experiencing any major side effects on the carbidopa  levodopa  except for some possible nausea, which is chronic for her anyway, and is not noted to be experiencing any worsening hallucinations or mania or other psychiatric concerns on the Sinemet .  Other differentials for presentation including things that could be comorbid with no con disorder include delirium, primary psychotic disorder.  Patient has psychiatry and neurology follow-up at discharge.  Linagliptin  was added as second oral diabetes agent given patient's A1c at 9.7, per recommendation of diabetes coordinator.  Patient was trialed off Sinemet  by tapering down to twice daily from 3 times daily for which she started having worsening motor symptoms including increased bradykinesia and stiffness in joints.  Plan increase back to 3 times daily and allow patient to determine next dose on Sinemet , likely neurology which has  been referred.  Plan for Alicia her caretaker to visit tonight and will talk tomorrow morning about how she is doing.  Furthermore patient is having a lot of right shoulder pain today.  Provocative testing suggest rotator cuff pathology for which we will make a orthopedic referral at discharge.  For pain she is experiencing will increase her Suboxone from 1 to 2 mg, given she is listed as being allergic to nearly every pain medication.      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, with psychotic disturbance (HCC) Ddx includes Lewy body dementia, Parkinson's disease with dementia, vascular dementia with parkinsonism, atypical parkinsonian disorder, comorbid primary psychotic disorder, comorbid chronic delirium - Continue Sinemet  IR 25-100 mg tablet three times daily - Continue buspar  5 mg twice daily for anxiety - outpatient neurology referral  #Chronic pain / neuropathy # R shoulder pain -- Continue Subutex  2 mg sublingual daily -- lidocaine  patch x3 for three different affected joints --Outpatient orthopedic referral for right rotator cuff injury   #Asthma / Allergies -- Albuterol  -- Breo-Ellipta -- Flonase  -- Loratidine   #HTN / HLD -- Losartan  100 mg daily -- Metoprolol  50 mg daily -- Crestor  5 mg daily   #T2DM -- Metformin  1000 mg BID -- Added linagliptin  5 mg once daily, per diabetes coordinator recommendation   #GERD / IBS -- Protonix  -- Linzess    #Nicotine  withdrawal - Patient in need of nicotine  replacement; nicotine  patch 14 mg / 24 hours ordered. Smoking cessation encouraged  Risk Assessment - minimal-mild risk given medical conditions, history of severe mental illness and hospitalizations, difficulty in ADLs and protective factors include engagement in treatment, having caretakers at home    Discharge Planning Barriers to discharge: safe discharge when caretaker can come Estimated length of stay:  1/2 Predicted Discharge location: Home  with caretaker     Interval History and update:  Reports she is still having pain.  Reports that she still feels ready to leave the hospital.  Reports that she is having improvement in her movements today and feels less stiff than yesterday.  Discussed with patient relationship to decreasing Sinemet  to the motor changes.  Discussed patient's care including the follow-ups we set up for her.  Discussed that we would share the various regimen with her.  She reports she is eating well.  She reports she is sleeping well.  Reports that she is walking without issues.  Denies any side effects medications.  Reports some ongoing nausea but not much change compared to previous days.   Collateral, Alicia Mayo, [phone]: Reports that she is at her psychiatric baseline including now hallucinations or paranoia.  Does report that she was nodding off yesterday which was concerning to him as she had just awoken from a nap.  He reports that he think she would be stable to leave but probably not tomorrow and also reports that he is not available tomorrow to get her so discussed doing a Friday morning discharge assuming everything continues well.  Informed him that social worker would reach out.  He reports that he is at the hospital currently to get her pocketbook.  Got permission from Donnabelle to give him pocketbook and other items in her locker, and this provider gave them to [place].  Alicia asked about primary care provider for patient and discussed that I made referral to various specialist and provided a [place] PCP in their area so that everyone is in the same system and he was grateful for this.     Physical Exam MSK/Neuro -ambulation back to baseline prior to decreasing Sinemet .  Patient is able to stand with minimal assistance from walker and using walker ambulates normal rate (faster than yesterday) and turns without difficulty and able to sit back down without difficulty.  Passive range of motion of  bilateral upper extremities reveals no pain or stiffness today, compared to yesterday during which there was some cogwheeling. Mental Status Exam Appearance - Casually dressed, appropriate hygiene and grooming, in wheelchair Attitude - Calm, polite, not guarded Speech - normal volume, prosody, inflection Mood - I am still hurting but doing okay Affect -appropriate, dysphoric due to pain Thought Process - LLGD Thought Content - No delusional TC expressed SI/HI - Denies  Perceptions - Denies AVH; not RIS Judgement/Insight - Good  Fund of knowledge - WNL/borderline MCI Language - No impairments      Lab Results:  Admission on 10/28/2024  Component Date Value Ref Range Status   Hgb A1c MFr Bld 10/29/2024 9.7 (H)  4.8 - 5.6 % Final   Mean Plasma Glucose 10/29/2024 231.69  mg/dL Final   Cholesterol 87/78/7974 118  0 - 200 mg/dL Final   Triglycerides 87/78/7974 200 (H)  <150 mg/dL Final   HDL 87/78/7974 30 (L)  >40 mg/dL Final   Total CHOL/HDL Ratio 10/29/2024 3.9  RATIO Final   VLDL 10/29/2024 40  0 - 40 mg/dL Final   LDL Cholesterol 10/29/2024 48  0 - 99 mg/dL Final   Glucose-Capillary 10/29/2024 183 (H)  70 - 99 mg/dL Final   Glucose-Capillary 10/29/2024 126 (H)  70 - 99 mg/dL Final   Comment 1 87/78/7974 Notify RN   Final   Comment 2 10/29/2024 Document in Chart   Final   Vit D, 1,25-Dihydroxy 10/30/2024 15.5 (  L)  24.8 - 81.5 pg/mL Final   Glucose-Capillary 10/30/2024 118 (H)  70 - 99 mg/dL Final   Glucose-Capillary 10/30/2024 197 (H)  70 - 99 mg/dL Final   Glucose-Capillary 10/30/2024 149 (H)  70 - 99 mg/dL Final   Glucose-Capillary 10/31/2024 112 (H)  70 - 99 mg/dL Final   Comment 1 87/76/7974 Notify RN   Final   Comment 2 10/31/2024 Document in Chart   Final   Glucose-Capillary 10/31/2024 117 (H)  70 - 99 mg/dL Final   Glucose-Capillary 10/31/2024 112 (H)  70 - 99 mg/dL Final   Total CK 87/76/7974 395 (H)  38 - 234 U/L Final   Glucose-Capillary 10/31/2024 115 (H)  70 - 99  mg/dL Final   Glucose-Capillary 11/01/2024 126 (H)  70 - 99 mg/dL Final   Specimen Source 11/02/2024 URINE, CLEAN CATCH   Final   Color, Urine 11/02/2024 YELLOW  YELLOW Final   APPearance 11/02/2024 CLEAR  CLEAR Final   Specific Gravity, Urine 11/02/2024 1.012  1.005 - 1.030 Final   pH 11/02/2024 6.0  5.0 - 8.0 Final   Glucose, UA 11/02/2024 NEGATIVE  NEGATIVE mg/dL Final   Hgb urine dipstick 11/02/2024 NEGATIVE  NEGATIVE Final   Bilirubin Urine 11/02/2024 NEGATIVE  NEGATIVE Final   Ketones, ur 11/02/2024 NEGATIVE  NEGATIVE mg/dL Final   Protein, ur 87/74/7974 NEGATIVE  NEGATIVE mg/dL Final   Nitrite 87/74/7974 NEGATIVE  NEGATIVE Final   Leukocytes,Ua 11/02/2024 SMALL (A)  NEGATIVE Final   RBC / HPF 11/02/2024 0-5  0 - 5 RBC/hpf Final   WBC, UA 11/02/2024 0-5  0 - 5 WBC/hpf Final   Bacteria, UA 11/02/2024 NONE SEEN  NONE SEEN Final   Squamous Epithelial / HPF 11/02/2024 0-5  0 - 5 /HPF Final   Mucus 11/02/2024 PRESENT   Final   Hyaline Casts, UA 11/02/2024 PRESENT   Final   Glucose-Capillary 11/01/2024 171 (H)  70 - 99 mg/dL Final   Glucose-Capillary 11/01/2024 125 (H)  70 - 99 mg/dL Final   Glucose-Capillary 11/02/2024 117 (H)  70 - 99 mg/dL Final   Glucose-Capillary 11/02/2024 130 (H)  70 - 99 mg/dL Final   Comment 1 87/74/7974 Notify RN   Final   Glucose-Capillary 11/02/2024 156 (H)  70 - 99 mg/dL Final   Total CK 87/73/7974 135  38 - 234 U/L Final   Sodium 11/03/2024 140  135 - 145 mmol/L Final   Potassium 11/03/2024 3.4 (L)  3.5 - 5.1 mmol/L Final   Chloride 11/03/2024 102  98 - 111 mmol/L Final   CO2 11/03/2024 25  22 - 32 mmol/L Final   Glucose, Bld 11/03/2024 182 (H)  70 - 99 mg/dL Final   BUN 87/73/7974 10  6 - 20 mg/dL Final   Creatinine, Ser 11/03/2024 0.87  0.44 - 1.00 mg/dL Final   Calcium  11/03/2024 9.8  8.9 - 10.3 mg/dL Final   Total Protein 87/73/7974 7.9  6.5 - 8.1 g/dL Final   Albumin 87/73/7974 4.1  3.5 - 5.0 g/dL Final   AST 87/73/7974 15  15 - 41 U/L Final    ALT 11/03/2024 10  0 - 44 U/L Final   Alkaline Phosphatase 11/03/2024 72  38 - 126 U/L Final   Total Bilirubin 11/03/2024 0.5  0.0 - 1.2 mg/dL Final   GFR, Estimated 11/03/2024 >60  >60 mL/min Final   Anion gap 11/03/2024 13  5 - 15 Final   Glucose-Capillary 11/02/2024 163 (H)  70 - 99 mg/dL Final  Glucose-Capillary 11/03/2024 106 (H)  70 - 99 mg/dL Final   Glucose-Capillary 11/03/2024 171 (H)  70 - 99 mg/dL Final   Comment 1 87/73/7974 Notify RN   Final   Glucose-Capillary 11/03/2024 137 (H)  70 - 99 mg/dL Final   Comment 1 87/73/7974 Notify RN   Final   Glucose-Capillary 11/03/2024 164 (H)  70 - 99 mg/dL Final   Glucose-Capillary 11/04/2024 105 (H)  70 - 99 mg/dL Final   Glucose-Capillary 11/04/2024 156 (H)  70 - 99 mg/dL Final   Glucose-Capillary 11/04/2024 129 (H)  70 - 99 mg/dL Final   Glucose-Capillary 11/05/2024 108 (H)  70 - 99 mg/dL Final   Glucose-Capillary 11/05/2024 194 (H)  70 - 99 mg/dL Final   Glucose-Capillary 11/05/2024 175 (H)  70 - 99 mg/dL Final   Glucose-Capillary 11/05/2024 155 (H)  70 - 99 mg/dL Final   Glucose-Capillary 11/06/2024 97  70 - 99 mg/dL Final   Glucose-Capillary 11/06/2024 231 (H)  70 - 99 mg/dL Final   Glucose-Capillary 11/06/2024 122 (H)  70 - 99 mg/dL Final   Glucose-Capillary 11/06/2024 154 (H)  70 - 99 mg/dL Final   Glucose-Capillary 11/07/2024 111 (H)  70 - 99 mg/dL Final   Glucose-Capillary 11/07/2024 144 (H)  70 - 99 mg/dL Final   Glucose-Capillary 11/07/2024 130 (H)  70 - 99 mg/dL Final   Glucose-Capillary 11/07/2024 119 (H)  70 - 99 mg/dL Final   Glucose-Capillary 11/08/2024 133 (H)  70 - 99 mg/dL Final   Glucose-Capillary 11/08/2024 126 (H)  70 - 99 mg/dL Final     Vitals: Blood pressure 131/75, pulse 74, temperature 98.2 F (36.8 C), temperature source Oral, resp. rate 20, height 5' 7 (1.702 m), weight 96.4 kg, SpO2 99%.    Justino Cornish, MD

## 2024-11-08 NOTE — [facility] Group Notes (Signed)
 Adult Psychoeducational Group Note  Date:  11/08/2024 Time:  9:02 PM  Group Topic/Focus:  Wrap-Up Group:   The focus of this group is to help patients review their daily goal of treatment and discuss progress on daily workbooks.  Participation Level:  Active  Participation Quality:  Appropriate  Affect:  Appropriate  Cognitive:  Appropriate  Insight: Appropriate  Engagement in Group:  Engaged  Modes of Intervention:  Discussion  Additional Comments:  Thais said her day 10. Her goal and she met she goes home. Favorite part of the day when the doctor said she goes home.  Lang Drilling Long 11/08/2024, 9:02 PM

## 2024-11-08 NOTE — BH IP Treatment Plan (Signed)
 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, with psychotic disturbance (HCC) Active Problems:   History of multiple strokes   Nonpsychotic mental disorder, unspecified   Right shoulder pain   Current Medications:  Current Facility-Administered Medications  Medication Dose Route Frequency Provider Last Rate Last Admin   albuterol  (VENTOLIN  HFA) 108 (90 Base) MCG/ACT inhaler 2 puff  2 puff Inhalation Q4H PRN Bobbitt, Shalon E, NP       alum & mag hydroxide-simeth (MAALOX/MYLANTA) 200-200-20 MG/5ML suspension 30 mL  30 mL Oral Q4H PRN Bobbitt, Shalon E, NP   30 mL at 11/03/24 1224   ammonium lactate  (AMLACTIN) 12 % cream 1 Application  1 Application Topical PRN Mannie Ashley SAILOR, MD       buprenorphine  (SUBUTEX ) SL tablet 2 mg  2 mg Sublingual Daily McCarty, Artie, MD   2 mg at 11/08/24 9165   busPIRone  (BUSPAR ) tablet 5 mg  5 mg Oral BID Faunce, Alina, DO   5 mg at 11/08/24 9164   carbidopa -levodopa  (SINEMET  IR) 25-100 MG per tablet immediate release 1 tablet  1 tablet Oral TID McCarty, Artie, MD   1 tablet at 11/08/24 0835   feeding supplement (GLUCERNA SHAKE) (GLUCERNA SHAKE) liquid 237 mL  237 mL Oral TID BM Chien, Stephanie, MD   237 mL at 11/08/24 0836   fluticasone  furoate-vilanterol (BREO ELLIPTA ) 200-25 MCG/ACT 1 puff  1 puff Inhalation Daily Mannie Ashley SAILOR, MD   1 puff at 11/08/24 9163   insulin  aspart (novoLOG ) injection 0-15 Units  0-15 Units Subcutaneous TID WC McCarty, Artie, MD   2 Units at 11/07/24 1801   insulin  aspart (novoLOG ) injection 0-5 Units  0-5 Units Subcutaneous QHS McCarty, Artie, MD       lidocaine  (LIDODERM ) 5 % 1 patch  1 patch Transdermal Q24H  McCarty, Artie, MD   1 patch at 11/07/24 1801   lidocaine  (LIDODERM ) 5 % 2 patch  2 patch Transdermal Q24H Bobbitt, Shalon E, NP   2 patch at 11/08/24 0724   linaclotide  (LINZESS ) capsule 290 mcg  290 mcg Oral Daily Faunce, Alina, DO   290 mcg at 11/08/24 9164   linagliptin  (TRADJENTA ) tablet 5 mg  5 mg Oral Daily McCarty, Artie, MD   5 mg at 11/08/24 9164   loratadine  (CLARITIN ) tablet 10 mg  10 mg Oral Daily Faunce, Alina, DO   10 mg at 11/08/24 9165   losartan  (COZAAR ) tablet 100 mg  100 mg Oral Daily Bobbitt, Shalon E, NP   100 mg at 11/08/24 0835   melatonin tablet 5 mg  5 mg Oral QHS Bouchard, Marc A, DO   5 mg at 11/07/24 2135   metFORMIN  (GLUCOPHAGE -XR) 24 hr tablet 1,000 mg  1,000 mg Oral BID Bobbitt, Shalon E, NP   1,000 mg at 11/08/24 0834   metoprolol  tartrate (LOPRESSOR ) tablet 50 mg  50 mg Oral BID Bobbitt, Shalon E, NP   50 mg at 11/08/24 0836   nicotine  (NICODERM CQ  - dosed in mg/24 hours) patch 14 mg  14 mg Transdermal Daily Pashayan, Alexander S, DO   14 mg at 11/02/24 0757   ondansetron  (ZOFRAN -ODT) disintegrating tablet 4 mg  4 mg Oral Q8H PRN Collene Gouge I, NP   4 mg at 11/07/24  2147   pantoprazole  (PROTONIX ) EC tablet 40 mg  40 mg Oral Daily Mannie Ashley SAILOR, MD   40 mg at 11/08/24 9165   rosuvastatin  (CRESTOR ) tablet 20 mg  20 mg Oral Daily Faunce, Alina, DO   20 mg at 11/08/24 9165   Vitamin D  (Ergocalciferol ) (DRISDOL ) 1.25 MG (50000 UNIT) capsule 50,000 Units  50,000 Units Oral Q7 days Idelle Nakai, DO   50,000 Units at 11/01/24 1110   PTA Medications: Medications Prior to Admission  Medication Sig Dispense Refill Last Dose/Taking   albuterol  (VENTOLIN  HFA) 108 (90 Base) MCG/ACT inhaler Inhale 2 puffs into the lungs every 4 (four) hours as needed.      ammonium lactate  (AMLACTIN) 12 % cream Apply 1 Application topically 2 (two) times daily as needed for dry skin (apply to affected areas).      benztropine  (COGENTIN ) 1 MG tablet Take 0.5 tablets (0.5 mg total) by mouth  2 (two) times daily as needed for tremors.      buprenorphine  (SUBUTEX ) 2 MG SUBL SL tablet Place 2 mg under the tongue in the morning, at noon, and at bedtime.      busPIRone  (BUSPAR ) 15 MG tablet Take 15 mg by mouth 3 (three) times daily.      escitalopram  (LEXAPRO ) 20 MG tablet Take 20 mg by mouth daily.      fexofenadine (ALLEGRA) 180 MG tablet Take 180 mg by mouth daily.      fluticasone  (FLONASE ) 50 MCG/ACT nasal spray Place 2 sprays into the nose daily.      fluticasone -salmeterol (ADVAIR) 250-50 MCG/ACT AEPB Inhale 1 puff into the lungs 2 (two) times daily.      furosemide  (LASIX ) 20 MG tablet Take 20 mg by mouth daily.      INVEGA  SUSTENNA 234 MG/1.5ML injection Inject 234 mg into the muscle once.      lactulose  (CHRONULAC ) 10 GM/15ML solution Take 45 mLs (30 g total) by mouth daily as needed for moderate constipation or severe constipation. 236 mL 0    lidocaine  (LIDODERM ) 5 % Place 1 patch onto the skin daily.      LINZESS  290 MCG CAPS capsule Take 290 mcg by mouth daily.      losartan  (COZAAR ) 100 MG tablet Take 100 mg by mouth daily.      Lurasidone  HCl 120 MG TABS Take 1 tablet by mouth daily.      metFORMIN  (GLUCOPHAGE -XR) 500 MG 24 hr tablet Take 1,000 mg by mouth 2 (two) times daily.      metoprolol  tartrate (LOPRESSOR ) 50 MG tablet Take 50 mg by mouth 2 (two) times daily.      naloxone  (NARCAN ) nasal spray 4 mg/0.1 mL Place 1 spray into the nose once.      paliperidone  (INVEGA ) 3 MG 24 hr tablet Take 2 tablets (6 mg total) by mouth at bedtime. 30 tablet 0    pantoprazole  (PROTONIX ) 40 MG tablet Take 40 mg by mouth daily.      polyethylene glycol (MIRALAX  / GLYCOLAX ) 17 g packet Take 17 g by mouth daily as needed.      pregabalin  (LYRICA ) 150 MG capsule Take 1 capsule (150 mg total) by mouth 2 (two) times daily.      rosuvastatin  (CRESTOR ) 5 MG tablet Take 5 mg by mouth daily.      trospium (SANCTURA) 20 MG tablet Take 20 mg by mouth 2 (two) times daily.       Patient  Stressors: Financial difficulties   Health problems  Patient Strengths: Ability for insight  Active sense of humor  Motivation for treatment/growth   Treatment Modalities: Medication Management, Group therapy, Case management,  1 to 1 session with clinician, Psychoeducation, Recreational therapy.   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, with psychotic disturbance (HCC) Long Term Goal(s): Improvement in symptoms so as ready for discharge   Short Term Goals: Ability to identify changes in lifestyle to reduce recurrence of condition will improve Ability to verbalize feelings will improve Ability to disclose and discuss suicidal ideas Ability to demonstrate self-control will improve Ability to identify and develop effective coping behaviors will improve Ability to maintain clinical measurements within normal limits will improve Compliance with prescribed medications will improve Ability to identify triggers associated with substance abuse/mental health issues will improve  Medication Management: Evaluate patient's response, side effects, and tolerance of medication regimen.  Therapeutic Interventions: 1 to 1 sessions, Unit Group sessions and Medication administration.  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, with psychotic disturbance (HCC) Active Problems:   History of multiple strokes   Nonpsychotic mental disorder, unspecified   Right shoulder pain  Long Term Goal(s): Improvement in symptoms so as ready for discharge   Short Term Goals: Ability to identify changes in lifestyle to reduce recurrence of condition will improve Ability to verbalize feelings will improve Ability to disclose and discuss suicidal ideas Ability to demonstrate self-control will improve Ability to  identify and develop effective coping behaviors will improve Ability to maintain clinical measurements within normal limits will improve Compliance with prescribed medications will improve Ability to identify triggers associated with substance abuse/mental health issues will improve     Medication Management: Evaluate patient's response, side effects, and tolerance of medication regimen.  Therapeutic Interventions: 1 to 1 sessions, Unit Group sessions and Medication administration.  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 RN will report any adverse and/or side effects to prescribing provider.  Therapeutic Interventions: 1 on 1 counseling sessions, Psychoeducation, Medication administration, Evaluate responses to treatment, Monitor vital signs and CBGs as ordered, Perform/monitor CIWA, COWS, AIMS and Fall Risk screenings as ordered, Perform wound care treatments as ordered.  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  Short Term Goals: Engage patient in aftercare  planning with referrals and resources, Increase ability to appropriately verbalize feelings, Facilitate acceptance of mental health diagnosis and concerns, and Identify triggers associated with mental health/substance abuse issues  Therapeutic Interventions: Assess for all discharge needs, 1 to 1 time with Social worker, Explore available resources and support systems, Assess for adequacy in community support network, Educate family and significant other(s) on suicide prevention, Complete Psychosocial Assessment,  Interpersonal group therapy.  Evaluation of Outcomes: Progressing   Progress in Treatment: Attending groups: attended some groups Participating in groups: Yes. Taking medication as prescribed: Yes. Toleration medication: Yes. Family/Significant other contact made: Yes, contacted [name] [phone] (caretaker)  Patient understands diagnosis: Yes. Discussing patient identified problems/goals with staff: Yes. Medical problems stabilized or resolved: Yes. Denies suicidal/homicidal ideation: Yes. Issues/concerns per patient self-inventory: No.   New problem(s) identified:  No   New Short Term/Long Term Goal(s):     medication stabilization, elimination of SI thoughts, development of comprehensive mental wellness plan.      Patient Goals:  I would like to review and adjust my medications to help with hallucinations.   Discharge Plan or Barriers:  Patient recently admitted. CSW will continue to follow and assess for appropriate referrals and possible discharge planning.      Reason for Continuation of Hospitalization: Hallucinations Medication stabilization   Estimated Length of Stay:  3 - 4 days  Last 3 [place] Suicide Severity Risk Score: Flowsheet Row [facility] (Current[facility] 10/27/2024 in [facility] to Hosp-Admission (Discharged) from 08/19/2024 in  [facility] ([facility])  C-SSRS RISK CATEGORY No Risk No Risk No Risk    Last PHQ 2/9 Scores:     No data to display          Scribe for Treatment Team: Lenka Zhao O Farrel Guimond, LCSWA 11/08/2024 11:10 AM

## 2024-11-08 NOTE — Plan of Care (Signed)
   Problem: Education: Goal: Knowledge of [facility] Education information/materials will improve Outcome: Progressing Goal: Emotional status will improve Outcome: Progressing Goal: Mental status will improve Outcome: Progressing Goal: Verbalization of understanding the information provided will improve Outcome: Progressing

## 2024-11-08 NOTE — Progress Notes (Signed)
" °   11/08/24 0800  Psych Admission Type (Psych Patients Only)  Admission Status Voluntary  Psychosocial Assessment  Patient Complaints None  Eye Contact Fair  Facial Expression Other (Comment) (WDL)  Affect Appropriate to circumstance  Speech Soft;Slow  Interaction Assertive  Motor Activity Slow;Unsteady  Appearance/Hygiene Unremarkable  Behavior Characteristics Cooperative;Appropriate to situation  Mood Pleasant  Aggressive Behavior  Effect No apparent injury  Thought Process  Coherency WDL  Content WDL  Delusions None reported or observed  Perception WDL  Hallucination None reported or observed  Judgment Limited  Confusion None  Danger to Self  Current suicidal ideation? Denies  Agreement Not to Harm Self Yes  Description of Agreement Verbal  Danger to Others  Danger to Others None reported or observed    "

## 2024-11-08 NOTE — Progress Notes (Signed)
" °   11/08/24 0800 11/08/24 0832 11/08/24 0858  Mobility (See group info for [facility] equipment and weight capacities recommendations)  HOB Elevated/Bed Position Self regulated  --   --   Activity Ambulated independently  --  Ambulated with assistance  Range of Motion/Exercises Active;All extremities  --   --   Level of Assistance Independent  --  Independent after set-up  Catering Manager;Four wheel walker  Distance Ambulated (ft)  --   --   (the length of the hallway to the nurse station and back)  Activity Response Tolerated well  --  Tolerated well  Transport method Wheelchair  --   --     11/08/24 1000 11/08/24 1124 11/08/24 1200  Mobility (See group info for [facility] equipment and weight capacities recommendations)  HOB Elevated/Bed Position  --   --   --   Activity Ambulated with assistance Ambulated with assistance Ambulated with assistance  Range of Motion/Exercises  --   --   --   Level of Assistance Independent after set-up Independent after set-up Independent after set-up  Assistive Device Four wheel walker Four wheel walker Four wheel walker  Distance Ambulated (ft)  (to window and back)  (walked around 300 and 400 hall)  (ambulated to dayroom and back)  Activity Response Tolerated well Tolerated well Tolerated well  Transport method  --   --   --     11/08/24 1245 11/08/24 1452  Mobility (See group info for [facility] equipment and weight capacities recommendations)  HOB Elevated/Bed Position  --   --   Activity Ambulated with assistance Ambulated with assistance  Range of Motion/Exercises  --   --   Level of Assistance Independent after set-up Independent after set-up  Assistive Device Four wheel walker Four wheel walker  Distance Ambulated (ft)  (ambulated in room)  (To bathroom)  Activity Response Tolerated well Tolerated well  Transport method  --   --     "

## 2024-11-08 NOTE — Group Note (Signed)
 Date:  11/08/2024 Time:  4:08 PM  Group Topic/Focus: Pharmacy Group Diagnosis Education:   The focus of this group is to discuss the major disorders that patients maybe diagnosed with.  Group discusses the importance of knowing what one's diagnosis is so that one can understand treatment and better advocate for oneself.    Participation Level:  Minimal  Alicia Mayo 11/08/2024, 4:08 PM

## 2024-11-08 NOTE — Inpatient Diabetes Management (Signed)
 Inpatient Diabetes Program Recommendations  AACE/ADA: New Consensus Statement on Inpatient Glycemic Control   Target Ranges:  Prepandial:   less than 140 mg/dL      Peak postprandial:   less than 180 mg/dL (1-2 hours)      Critically ill patients:  140 - 180 mg/dL    Latest Reference Range & Units 11/07/24 06:44 11/07/24 12:01 11/07/24 17:27 11/07/24 21:40 11/08/24 07:28  Glucose-Capillary 70 - 99 mg/dL 888 (H) 855 (H) 869 (H) 119 (H) 133 (H)   Review of Glycemic Control  Diabetes history: DM2 Outpatient Diabetes medications: Metformin  XR 1000 mg BID Current orders for Inpatient glycemic control: Metformin  XR 1000 mg BID, Tradjenta  5 mg daily, Novolog  0-15 units TID with meals, Novolog  0-5 units QHS   Inpatient Diabetes Program Recommendations:     Outpatient DM: At time of discharge, please consider discharging on Metformin  XR 1000 mg BID and Tradjenta  5 mg daily (low risk of causing hypoglycemia).     NOTE: Patient admitted 10/28/24 with auditory/visual hallucinations. Noted consult for discharge recommendations for DM medications. Recommend prescribing Metformin  XR 1000 mg BID and Tradjenta  5 mg daily. Signing off consult; please reconsult inpatient diabetes team if needed.  Thanks, Earnie Gainer, RN, MSN, CDCES Diabetes Coordinator Inpatient Diabetes Program [phone] (Team Pager from 8am to 5pm)

## 2024-11-09 DIAGNOSIS — F0392 Unspecified dementia, unspecified severity, with psychotic disturbance: Secondary | ICD-10-CM | POA: Diagnosis not present

## 2024-11-09 LAB — GLUCOSE, CAPILLARY
Glucose-Capillary: 109 mg/dL — ABNORMAL HIGH (ref 70–99)
Glucose-Capillary: 115 mg/dL — ABNORMAL HIGH (ref 70–99)
Glucose-Capillary: 120 mg/dL — ABNORMAL HIGH (ref 70–99)
Glucose-Capillary: 119 mg/dL — ABNORMAL HIGH (ref 70–99)

## 2024-11-09 LAB — COMPREHENSIVE METABOLIC PANEL WITH GFR
ALT: 8 U/L (ref 0–44)
AST: 22 U/L (ref 15–41)
Albumin: 4.2 g/dL (ref 3.5–5.0)
Alkaline Phosphatase: 78 U/L (ref 38–126)
Anion gap: 12 (ref 5–15)
BUN: 9 mg/dL (ref 6–20)
CO2: 27 mmol/L (ref 22–32)
Calcium: 10.4 mg/dL — ABNORMAL HIGH (ref 8.9–10.3)
Chloride: 102 mmol/L (ref 98–111)
Creatinine, Ser: 0.89 mg/dL (ref 0.44–1.00)
GFR, Estimated: >60 mL/min
Glucose, Bld: 115 mg/dL — ABNORMAL HIGH (ref 70–99)
Potassium: 3.8 mmol/L (ref 3.5–5.1)
Sodium: 141 mmol/L (ref 135–145)
Total Bilirubin: 0.3 mg/dL (ref 0.0–1.2)
Total Protein: 8 g/dL (ref 6.5–8.1)

## 2024-11-09 MED ORDER — ONDANSETRON 4 MG PO TBDP: 4.0000 mg | ORAL_TABLET | Freq: Three times a day (TID) | ORAL | 0 refills | Status: AC | PRN

## 2024-11-09 MED ORDER — NICOTINE 14 MG/24HR TD PT24: 14.0000 mg | MEDICATED_PATCH | Freq: Every day | TRANSDERMAL | Status: AC

## 2024-11-09 MED ORDER — BUPRENORPHINE HCL 2 MG SL SUBL: 2.0000 mg | SUBLINGUAL_TABLET | Freq: Every day | SUBLINGUAL | Status: AC

## 2024-11-09 MED ORDER — VITAMIN D (ERGOCALCIFEROL) 1.25 MG (50000 UNIT) PO CAPS: 50000.0000 [IU] | ORAL_CAPSULE | ORAL | 0 refills | Status: AC

## 2024-11-09 MED ORDER — LINAGLIPTIN 5 MG PO TABS: 5.0000 mg | ORAL_TABLET | Freq: Every day | ORAL | 0 refills | Status: DC

## 2024-11-09 MED ORDER — CARBIDOPA-LEVODOPA 25-100 MG PO TABS: 1.0000 | ORAL_TABLET | Freq: Three times a day (TID) | ORAL | 0 refills | Status: DC

## 2024-11-09 NOTE — Progress Notes (Addendum)
 Patient informed of right to appeal discharge, provided phone number to [employer]. Patient expressed no interest in appealing discharge at this time. CSW will continue to monitor situation.   Nanie Dunkleberger, LCSWA 11/09/2024

## 2024-11-09 NOTE — Group Note (Signed)
 Date:  11/09/2024 Time:  9:39 AM  Group Topic/Focus:  Goals Group:   The focus of this group is to help patients establish daily goals to achieve during treatment and discuss how the patient can incorporate goal setting into their daily lives to aide in recovery. Orientation:   The focus of this group is to educate the patient on the purpose and policies of crisis stabilization and provide a format to answer questions about their admission.  The group details unit policies and expectations of patients while admitted.    Participation Level:  Did Not Attend  Alicia Mayo R Alicia Mayo 11/09/2024, 9:39 AM

## 2024-11-09 NOTE — Discharge Summary (Addendum)
 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, with psychotic disturbance (HCC) Active Problems:   History of multiple strokes   Nonpsychotic mental disorder, unspecified   Right shoulder pain   Past Psychiatric History: Current psychiatrist: None Current therapist: None Previous psychiatric diagnoses: bipolar I disorder, anxiety Psychiatric Medications:             -Home Meds: Invega  Sustenna 234 mg, Cogentin  1 mg BID PRN, Buspar  15 mg TID, Lexapro  20 mg, Latuda  120 mg daily             -Past Med Trials: Trintellix, Valium, Trazodone, Lexapro , amitriptyline, Remeron  Psychiatric hospitalization(s): Yes Psychotherapy history: Denies Neuromodulation history: Denies History of suicide (obtained from HPI): Denies History of homicide or aggression (obtained in HPI): Denies NSSIB: Denies   Substance Abuse History: Patient denies history of substance use.   Past Medical History: Medical diagnoses: diabetes, HTN, hx of 8 strokes with residual left-sided weakness (last stroke 1 year ago), seizures, peripheral neuropathy Medications: subutex , allegra, Flonase , Advair, lasix , lactulose , linzess , losartan , pantoprazole , metoprolol , Lyrica , Crestor , Seroquel Allergies: celecoxib, gabapentin, keppra, quetiapine, tramadol PCP: Elsie Rubinstein, MD Hospitalizations: recent hospitalizations for severe UTI Surgeries: cholecystectomy, appendectomy Trauma: Denies Seizures: Yes   Social  History: Current living situation: Lives alone in an apartment. Has two caretakers who see her daily. Education: Finished 11th grade. Required extra assistance during grade school. Occupational history: Retired Marital status: Divorced Children: Yes, 6 children but 1 passed away 2 years ago Legal: Denies Military: No   Family Psychiatric History: Bipolar disorder in her maternal aunt and mother. Schizophrenia in her child.   Family Medical History: Unknown    Hospital course: On admission patient had substantial parkinsonism to the point of being akinetic.  Patient was likely having chronic delirium from polypharmacy and UTIs.  Goal early hospitalization was to reduce polypharmacy including removing antipsychotics and decreasing opioid and other anticholinergic/sedating medications.  Patient had mild improvement for which she was started on Sinemet  and had a rapid response in her motor symptoms.  Given the response to Sinemet , hallucinations of children/cats, vivid dreams, mild cog impairment, and parkinsonism, and there is reasonable suspicion for Lewy body dementia, Parkinson's disease, or other atypical parkinsonian disorder, for which patient was provided referral to neurology.  Attempted to taper Sinemet  which resulted in patient having rapid return of her motor symptoms including cogwheeling felt in extremities on passive range of motion within 12 hours of decreasing Sinemet .  Patient was titrated back to 3 times daily dosing and had rapid resolution of the parkinsonism.  By discharge patient was no longer experiencing any psychosis without antipsychotic on board.  Of note patient received Invega  per pharmacy dispense records in November but per report may have received a dose in December which suggest that patient may still be metabolizing the Invega  dose currently.  Would be reasonable to consider another taper of Sinemet  by neurologist or psychiatrist as she may not need it when the  antipsychotic is out of her system if it is simply drug-induced parkinsonism  but there is still high suspicion that patient may have a parkinsonian disorder.  Subutex  was decreased to 1 mg but patient was having some breakthrough pain for which she was increased to 2 mg and appeared to be effective.  Given patient's right rotator cuff issues, referral made to Ortho.  Patient was also set up with a new primary care physician in the [facility] system, per patient's caretaker [name]' request, so providers can see PCP and specialist notes.  During the patient's hospitalization, patient had extensive initial psychiatric evaluation, and follow-up psychiatric evaluations every day.  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 ) and/or major neurocognitive disorder (Lewy body dementia, Parkinson disease, or atypical parkinsonian disorder) Chronic delirium with behavioral disturbance, likely from chronic UTIs and polypharmacy   Pertinent medication changes: Held all antipsychotic medications, although Invega  may still be in her system as there is report that she got dose in December although not in pharmacy dispense records Started Sinemet  IR 25-100 3 times daily; prior to discharge, attempted to taper which resulted in return of parkinsonism for which it was increased back discharge dosing Decrease Subutex  to 2 mg once daily for chronic pain/OUD Added linagliptin  5 once daily to metformin , given A1c 9.7 (recommended by diabetes coordinator RN) Continued home medications for hypertension, hyperlipidemia, asthma/allergies, GERD/IBS   Patient's care was discussed during the interdisciplinary team meeting every day during the hospitalization.  The patient is not having side effects to prescribed psychiatric medication.  Gradually, patient started adjusting to milieu. The patient was evaluated each day by a clinical provider to ascertain response to treatment.  Improvement was noted by the patient's report of decreasing symptoms, improved sleep and appetite, affect, medication tolerance, behavior, and participation in unit programming.  Patient was asked each day to complete a self inventory noting mood, mental status, pain, new symptoms, anxiety and concerns.   Symptoms were reported as significantly decreased or resolved completely by discharge.  The patient reports that their mood is stable.  The patient denied having suicidal thoughts for more than 48 hours prior to discharge.  Patient denies having homicidal thoughts.  Patient denies having auditory hallucinations.  Patient denies any visual hallucinations or other symptoms of psychosis.  The patient was motivated to continue taking medication with a goal of continued improvement in mental health.   The patient reports their target psychiatric symptoms of psychosis and parkinsonism responded well to the psychiatric medications, and the patient reports overall benefit other psychiatric hospitalization. Supportive psychotherapy was provided to the patient. The patient also participated in regular group therapy while hospitalized. Coping skills, problem solving as well as relaxation therapies were also part of the unit programming.  Labs were reviewed with the patient, and abnormal results were discussed with the patient.  The patient is able to verbalize their individual safety plan to this provider.  # It is recommended to the patient to continue psychiatric medications as prescribed, after discharge from the hospital.    # It is recommended to the patient to follow up with your outpatient psychiatric provider and PCP.  # It was discussed with the patient, the impact of alcohol , drugs, tobacco have been there overall psychiatric and medical wellbeing, and total abstinence from substance use was recommended the patient.ed.  # Prescriptions provided or sent directly to preferred pharmacy at discharge.  Patient agreeable to plan. Given opportunity to ask questions. Appears to feel comfortable with discharge.    # In the event of worsening symptoms, the patient  is instructed to call the crisis hotline, 911 and or go to the nearest ED for appropriate evaluation and treatment of symptoms. To follow-up with primary care provider for other medical issues, concerns and or health care needs  # Patient was discharged home with [name] patient's caregiver with a plan to follow up as noted below.    On day of discharge, patient reports her motor symptoms are stable and much improved. Denies side effects to medications. Ongoing nausea but chronic and unchanged. Denies thoughts to harm self or others. Denies AVH including seeing cats or grandchildren. Denies paranoia. Undertsands she has numerous appointments. Understand extensive medication changes, which I discussed with her and informed is in discharge paperwork. Reports her mood and anxiety are stable. Reports she walked around unit a lot yesterday.      Physical Findings: AIMS: 0  Musculoskeletal: Strength & Muscle Tone: within normal limits, no parkinsonism on passive range of motion Gait & Station: Unsteady after require walker but able to ambulate independently, similar to patient's baseline before she developed the parkinsonism prior to admission Patient leans: N/A   Psychiatric Specialty Exam:  Presentation  General Appearance:  Appropriate for Environment  Eye Contact: Good  Speech: Normal Rate  Speech Volume: Normal  Mood and Affect  Mood: Euthymic  Affect: Appropriate; Congruent; Full Range   Thought Process  Thought Processes: Coherent; Linear  Descriptions of Associations:Intact  Orientation:Full (Time, Place and Person)  Thought Content:Logical  Hallucinations:Hallucinations: None  Ideas of Reference:None  Suicidal Thoughts:Suicidal Thoughts: No  Homicidal Thoughts:Homicidal Thoughts: No   Sensorium   Memory: Immediate Good; Recent Good; Remote Good  Judgment: Good  Insight: Good   Executive Functions  Concentration: Good  Attention Span: Good  Recall: Good  Fund of Knowledge: Good  Language: Good   Psychomotor Activity  Psychomotor Activity: Psychomotor Activity: Normal   Assets  Assets: Desire for Improvement; Housing; Social Support   Sleep  Sleep: Sleep: Good  Estimated Sleeping Duration (Last 24 Hours): 5.75-7.00 hours   Physical Exam: Physical Exam Vitals and nursing note reviewed.  Pulmonary:     Effort: Pulmonary effort is normal.  Neurological:     Mental Status: She is alert.     Comments: Cranial nerves II through XII intact.  No pain or stiffness with passive range of motion of bilateral upper extremities.  Sensation intact and symmetric throughout.  Coordination intact.  Patient able to stand independently with walker ambulate normally, turn around, and return to seating without assistance.  Patient able to walk in the hallway without assistance using walker.  Alert and oriented person place and time.  Attention and memory are grossly intact.  Speech fluent without dysarthria or aphasia.  No evidence of tremor.  No bradykinesia.  Psychiatric:     Comments: No obvious EPS.     Review of Systems  Constitutional:  Negative for fever.  Cardiovascular:  Negative for chest pain and palpitations.  Gastrointestinal:  Negative for constipation, diarrhea, nausea and vomiting.  Musculoskeletal:        Right shoulder pain  Neurological:  Negative for dizziness, weakness and headaches.   Blood pressure (!) 146/79, pulse 69, temperature 98.5 F (36.9 C), temperature source Oral, resp. rate 20, height 5' 7 (1.702 m), weight 96.4 kg, SpO2 98%. Body mass index is 33.3 kg/m.   Tobacco Use History[1] Tobacco Cessation:  A prescription for an FDA-approved tobacco cessation medication provided at discharge   Blood Alcohol  level:  Lab Results   Component Value Date  ETH <15 03/10/2024    Metabolic Disorder Labs:  Lab Results  Component Value Date   HGBA1C 9.7 (H) 10/29/2024   MPG 231.69 10/29/2024   MPG 248.91 08/03/2024   No results found for: PROLACTIN Lab Results  Component Value Date   CHOL 118 10/29/2024   TRIG 200 (H) 10/29/2024   HDL 30 (L) 10/29/2024   CHOLHDL 3.9 10/29/2024   VLDL 40 10/29/2024   LDLCALC 48 10/29/2024    See Psychiatric Specialty Exam and Suicide Risk Assessment completed by Attending Physician prior to discharge.  Discharge destination:  Home with caregiver [name]  Is patient on multiple antipsychotic therapies at discharge:  No     Discharge Instructions     AMB referral to orthopedics   Complete by: As directed    Ambulatory referral to Neurology   Complete by: As directed    An appointment is requested in approximately: 4 weeks  Concern for Parkinsonian disorder such as Lewy Body Dementia   Increase activity slowly   Complete by: As directed       Allergies as of 11/10/2024       Reactions   Aspirin Nausea Only, Swelling   Sinus swelling   Celecoxib Other (See Comments), Swelling   Other Reaction: OTHER REACTION = SWELLING   Gabapentin Rash, Swelling   Pt says she gets face and throat swelling.   Ibuprofen Nausea And Vomiting   Other Reaction: TONGUE SWELING/BLEEDING   Ivp Dye [iodinated Contrast Media] Itching   Levetiracetam Rash   Oxycodone Itching, Swelling   Swelling of tongue   Oxycodone-acetaminophen  Itching, Nausea Only, Swelling   Pt states that nausea was the most significant effect. Pt states that percocet is tolerable when given ondansetron  (ZOFRAN ).   Quetiapine Other (See Comments), Rash   Increased blood sugars.   Shellfish Allergy Swelling   Scallops, shellfish => swelling   Soap Swelling   Irish spring soap   Acetaminophen  Nausea And Vomiting   Alprazolam Other (See Comments)   Memory loss   Atorvastatin    Other reaction(s): Muscle Pain    Diazepam Other (See Comments)   Memory loss   Dicyclomine Other (See Comments)   Over-sedation   Propoxyphene    With tylenol    Sertraline Other (See Comments)   Other reaction(s): Unknown unknown   Trazodone Other (See Comments)   Over-sedation   Acetaminophen -codeine Itching, Nausea Only   Codeine Itching, Nausea Only   No associated rash. Itching stops on its own after a few days, but faster when given diphenhydramine.   Tramadol Swelling   Facial swelling   Zolpidem Nausea Only, Other (See Comments)   Causes memory loss        Medication List     STOP taking these medications    benztropine  1 MG tablet Commonly known as: COGENTIN    busPIRone  15 MG tablet Commonly known as: BUSPAR    escitalopram  20 MG tablet Commonly known as: LEXAPRO    fluticasone  50 MCG/ACT nasal spray Commonly known as: FLONASE    Invega  Sustenna 234 MG/1.5ML injection Generic drug: paliperidone    Lurasidone  HCl 120 MG Tabs   Narcan  4 MG/0.1ML Liqd nasal spray kit Generic drug: naloxone    paliperidone  3 MG 24 hr tablet Commonly known as: INVEGA    pantoprazole  40 MG tablet Commonly known as: PROTONIX    pregabalin  150 MG capsule Commonly known as: LYRICA        TAKE these medications      Indication  carbidopa -levodopa  25-100 MG tablet Commonly known as:  SINEMET  IR Take 1 tablet by mouth 3 (three) times daily. The timing of this medication is very important.  Indication: Parkinsonian-Like Syndrome   albuterol  108 (90 Base) MCG/ACT inhaler Commonly known as: VENTOLIN  HFA Inhale 2 puffs into the lungs every 4 (four) hours as needed.  Indication: Asthma   ammonium lactate  12 % cream Commonly known as: AMLACTIN Apply 1 Application topically 2 (two) times daily as needed for dry skin (apply to affected areas).  Indication: Ichthyosis Vulgaris   buprenorphine  2 MG Subl SL tablet Commonly known as: SUBUTEX  Place 1 tablet (2 mg total) under the tongue daily. What changed:  when to take this  Indication: Opioid Dependence   fexofenadine 180 MG tablet Commonly known as: ALLEGRA Take 180 mg by mouth daily.  Indication: Hayfever   fluticasone -salmeterol 250-50 MCG/ACT Aepb Commonly known as: ADVAIR Inhale 1 puff into the lungs 2 (two) times daily.  Indication: Asthma   furosemide  20 MG tablet Commonly known as: LASIX  Take 20 mg by mouth daily.  Indication: Heart Failure   lactulose  10 GM/15ML solution Commonly known as: CHRONULAC  Take 45 mLs (30 g total) by mouth daily as needed for moderate constipation or severe constipation.  Indication: Constipation   lidocaine  5 % Commonly known as: LIDODERM  Place 1 patch onto the skin daily.  Indication: Allodynia   linagliptin  5 MG Tabs tablet Commonly known as: TRADJENTA  Take 1 tablet (5 mg total) by mouth daily.  Indication: Type 2 Diabetes   Linzess  290 MCG Caps capsule Generic drug: linaclotide  Take 290 mcg by mouth daily.  Indication: Chronic Constipation of Unknown Cause   losartan  100 MG tablet Commonly known as: COZAAR  Take 100 mg by mouth daily.  Indication: High Blood Pressure   metFORMIN  500 MG 24 hr tablet Commonly known as: GLUCOPHAGE -XR Take 1,000 mg by mouth 2 (two) times daily.  Indication: Type 2 Diabetes   metoprolol  tartrate 50 MG tablet Commonly known as: LOPRESSOR  Take 50 mg by mouth 2 (two) times daily.  Indication: High Blood Pressure, Supraventricular Tachycardia   nicotine  14 mg/24hr patch Commonly known as: NICODERM CQ  - dosed in mg/24 hours Place 1 patch (14 mg total) onto the skin daily.  Indication: Nicotine  Addiction   ondansetron  4 MG disintegrating tablet Commonly known as: ZOFRAN -ODT Take 1 tablet (4 mg total) by mouth every 8 (eight) hours as needed for nausea or vomiting.  Indication: Nausea and Vomiting   polyethylene glycol 17 g packet Commonly known as: MIRALAX  / GLYCOLAX  Take 17 g by mouth daily as needed.  Indication: Chronic Constipation of  Unknown Cause   rosuvastatin  5 MG tablet Commonly known as: CRESTOR  Take 5 mg by mouth daily.  Indication: Disease involving Lipid Deposits in the Arteries, High Amount of Fats in the Blood   trospium 20 MG tablet Commonly known as: SANCTURA Take 20 mg by mouth 2 (two) times daily.  Indication: Overactive Bladder   Vitamin D  (Ergocalciferol ) 1.25 MG (50000 UNIT) Caps capsule Commonly known as: DRISDOL  Take 1 capsule (50,000 Units total) by mouth every 7 (seven) days. On Mondays. Start taking on: November 15, 2024  Indication: Vitamin D  Deficiency        Follow-up Information     [facility]. Go on 01/18/2025.   Why: You have an appointment to establish care for behavioral health services 01/19/24 at 10:40 am, in person.  * Please also call periodically to check for cancellations, for a sooner appointment. * Please confirm your appointment by 2 days prior.  Contact information: [facility]  [address] [place], [place] [address]  P:  [phone]        [facility] Follow up on 11/10/2024.   Why: You have a hospital follow up appointment for therapy services on 11/11/23 at 10:00 am.  The appointment will be Virtual, telehealth.  Medication management services is also available.  If you miss this appointment, please call to reschedule. Contact information: [address]  [address] [place] [address] [phone]         [facility] Follow up.   Specialty: Neurology Why: PLEASE CALL TO MAKE APPOINTMENT ASAP. REFERRAL HAS BEEN SENT. Contact information: [address] [place]  [phone]        [facility] [place] Follow up.   Specialty: Orthopedics Why: CALL TO MAKE AN APPOINTMENT ASAP. REFERRAL HAS BEEN SENT. Contact information: [address] [place]  [address] [phone]        [facility] Follow up.   Why: CALL [phone] TO MAKE APPOINTMENT FOR PRIMARY CARE PHYSICIAN. Ask if Dr. Sharma is accepting new patients.                Plan Of Care/Follow-up recommendations:  Activity: as tolerated   Diet: heart healthy   Other: -Follow-up with your outpatient psychiatric provider -instructions on appointment date, time, and address (location) are provided to you in discharge paperwork.   -Take your psychiatric medications as prescribed at discharge - instructions are provided to you in the discharge paperwork   -Follow-up with outpatient primary care doctor and other specialists -for management of chronic medical disease, including: parkinsonism with neurology, numerous medical conditions including diabetes with PCP, R rotator cuff injury with ortho   -Testing: Follow-up with outpatient provider for abnormal lab results: A1c in three months from initiation of glipizide   -Recommend abstinence from alcohol , tobacco, and other illicit drug use at discharge.    -If your psychiatric symptoms recur, worsen, or if you have side effects to your psychiatric medications, call your outpatient psychiatric provider, 911, [phone] or go to the nearest emergency department.   -If suicidal thoughts recur, call your outpatient psychiatric provider, 911, [phone] or go to the nearest emergency department.  Signed: Justino Cornish, MD 11/10/2024, 1:59 PM   Edit -Updated date of discharge to reflect the new year Date of Discharge is 11/10/2024  Dr Lamar Slain Attending Psychiatrist          [1]  Social History Tobacco Use  Smoking Status Every Day   Current packs/day: 1.00   Types: Cigarettes  Smokeless Tobacco Never   "

## 2024-11-09 NOTE — Progress Notes (Addendum)
 1:1 Nursing Note  1000  On assessment, patient is laying in bed sleeping. Patient is calm and breathing is easy and unlabored. No distress was noted. Patient remains on 1:1 for safety with a sitter at her side at all times. The patient is safe on this unit with q15 minutes safety checks.

## 2024-11-09 NOTE — Progress Notes (Signed)
(  Sleep Hours) - 8.75  (Any PRNs that were needed, meds refused, or side effects to meds)-  Zofran  for nausea  (Any disturbances and when (visitation, over night)- none (Concerns raised by the patient)- none  (SI/HI/AVH)- denies

## 2024-11-09 NOTE — Progress Notes (Signed)
" °  1:1 Nursing Note 11/08/24 2200  On assessment, patient is resting in her bed. Patient is talking and responding appropriately to this nurse's questions. Patient's respirations are even and there is no evidence of distress.  Patient remains on 1:1 for safety with a sitter at her side at all times. The patient is safe on this unit with q15 minutes safety checks. "

## 2024-11-09 NOTE — Progress Notes (Signed)
" °  1:1 Nursing Note 11/09/24 0600   On assessment, patient is quiet, resting in her bed. Patient's breathing is easy, showing no signs of distress. Patient remains on 1:1 for safety with a sitter at her side at all times. The patient is safe on this unit with q15 minutes safety checks. "

## 2024-11-09 NOTE — Group Note (Signed)
 LCSW Group Therapy Note   Group Date: 11/09/2024 Start Time: 1100 End Time: 1200   Participation:  did not attend  Type of Therapy:  Group Therapy  Topic:  Healing Hearts:  A Safe Space for Grief  Objective:   The objective of this class, Healing Hearts:  A Safe Space for Grief, is to create a compassionate environment where participants can process their grief, explore different stages of grief, and discover ways to honor their loved ones through personal rituals.  3 Goals: Provide a safe and supportive space where participants feel comfortable sharing their feelings and experiences of grief without judgment. Educate participants about the stages of grief and emphasize that there is no right way to grieve or a fixed timeline for healing. Introduce the concept of rituals as a means to process grief, allowing individuals to honor their loved ones in a personal and meaningful way.  Summary:  In Healing Hearts: A Safe Space for Grief, we explored the unique and personal journey of grief, emphasizing that everyone experiences it differently.  We discussed the five stages of grief (denial, anger, bargaining, depression, and acceptance), with the understanding that grief is not linear.  Rituals were introduced as a way to help cope with loss, offering comfort and connection through meaningful actions such as lighting candles or taking memory walks. Participants were encouraged to express their emotions, focus on self-care, and reflect on moments of gratitude for their loved ones, recognizing that healing is a process and there is no timeline for grief.  Therapeutic Modalities: Elements of CBT: Challenge thoughts, reframe beliefs, self-compassion Elements of DBT: Mindfulness, distress tolerance, emotion regulation Supportive Therapy:  Provide validation, foster a safe and supportive group environment, normalize grief   Catherene MALVA Dynes, LCSWA 11/09/2024  12:34 PM

## 2024-11-09 NOTE — Progress Notes (Signed)
 1:1 Nursing Note  1400  On assessment, patient is laying in bed. Patient is calm and breathing is easy and unlabored. No distress was noted. Patient remains on 1:1 for safety with a sitter at her side at all times. The patient is safe on this unit with q15 minutes safety checks.

## 2024-11-09 NOTE — Care Management Important Message (Signed)
 Medicare IM printed and given to social work to give to the patient. ?

## 2024-11-09 NOTE — Group Note (Signed)
 Date:  11/09/2024 Time:  6:21 PM  Group Topic/Focus: Occupational Therapy    Pt did not attend occupational therapy group  Alicia Mayo R Shamus Desantis 11/09/2024, 6:21 PM

## 2024-11-09 NOTE — Group Note (Signed)
 Date:  11/09/2024 Time:  12:18 PM  Group Topic/Focus: Social Work   Pt did not attend social work group  [name] R [name] 11/09/2024, 12:18 PM

## 2024-11-09 NOTE — BHH Suicide Risk Assessment (Signed)
 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 Denies side effects to medications. Ongoing nausea but chronic and unchanged. Denies thoughts to harm self or others. Denies AVH including seeing cats or grandchildren. Denies paranoia. Undertsands she has numerous appointments. Understand extensive medication changes, which I discussed with her and informed is in discharge paperwork. Reports her mood and anxiety are stable. Reports she walked around unit a lot yesterday.   Musculoskeletal: Strength & Muscle Tone: within normal limits Gait & Station: normal Patient leans: N/A  Psychiatric Specialty Exam  Presentation  General Appearance:  Appropriate for Environment  Eye Contact: Good  Speech: Normal Rate  Speech Volume: Normal  Mood and Affect  Mood: Euthymic  Affect: Appropriate; Congruent; Full Range   Thought Process  Thought Processes: Coherent; Linear  Descriptions of Associations:Intact  Orientation:Full (Time, Place and Person)  Thought Content:Logical  Hallucinations:Hallucinations: None  Ideas of Reference:None  Suicidal Thoughts:Suicidal Thoughts: No  Homicidal Thoughts:Homicidal Thoughts: No   Sensorium  Memory: Immediate Good; Recent Good; Remote Good  Judgment: Good  Insight: Good   Executive Functions  Concentration: Good  Attention Span: Good  Recall: Good  Fund of Knowledge: Good  Language: Good   Psychomotor Activity   Psychomotor Activity:Psychomotor Activity: Normal   Assets  Assets: Desire for Improvement; Housing; Social Support   Sleep  Sleep:Sleep: Good  Estimated Sleeping Duration (Last 24 Hours): 5.75-7.00 hours  Physical Exam: Physical Exam Vitals and nursing note reviewed.  Pulmonary:     Effort: Pulmonary effort is normal.  Neurological:     General: No focal deficit present.     Mental Status: She is alert and oriented to person, place, and time.     Comments: Minimial if any parkinsonism including on passive range of motion of bilateral upper extremities.     Review of Systems  Constitutional:  Negative for fever.  Cardiovascular:  Negative for chest pain and palpitations.  Gastrointestinal:  Positive for nausea. Negative for constipation, diarrhea and vomiting.  Musculoskeletal:        Right shoulder pain.   Neurological:  Negative for dizziness, weakness and headaches.   Blood pressure (!) 146/79, pulse 69, temperature 98.5 F (36.9 C), temperature source Oral, resp. rate 20, height 5' 7 (1.702 m), weight 96.4 kg, SpO2 98%. Body mass index is 33.3 kg/m.  Mental Status Per Nursing Assessment::   On Admission:  NA  Demographic Factors:  NA  Loss Factors: NA  Historical Factors: NA  Risk Reduction Factors:   Living with another person, especially a relative, Positive social support, Positive therapeutic relationship, and Positive coping skills or problem solving skills  Continued Clinical Symptoms:  Mood is stable. Anxiety at a manageable level. Denying any SI including passive SI.   Cognitive Features That Contribute To Risk:  None    Suicide Risk:  Mild:  There are no identifiable suicide plans, no associated intent, mild dysphoria and related symptoms, good self-control (both objective and subjective assessment), few other risk factors, and identifiable protective factors, including available and accessible social support.    Follow-up Information  [facility]. Go on 01/18/2025.   Why: You have an appointment to establish care for behavioral health services 01/19/24 at 10:40 am, in person.  * Please also call periodically to check for cancellations, for a sooner appointment. * Please confirm your appointment by 2 days prior. Contact information: [facility]  [address] [place], [place] [address]  P:  [phone]        [facility] Follow up on 11/10/2024.   Why: You have a hospital follow up appointment for therapy services on 11/11/23 at 10:00 am.  The appointment will be Virtual, telehealth.  Medication management services is also available.  If you miss this appointment, please call to reschedule. Contact information: [address]  [address] [place] [address] [phone]         [facility] Follow up.   Specialty: Neurology Why: PLEASE CALL TO MAKE APPOINTMENT ASAP. REFERRAL HAS BEEN SENT. Contact information: [address] [place]  [address] [phone]        [facility] Follow up.   Specialty: Orthopedics Why: CALL TO MAKE AN APPOINTMENT ASAP. REFERRAL HAS BEEN SENT. Contact information: [address]  [address] [phone]        [facility] Follow up.   Why: CALL [phone] TO MAKE APPOINTMENT FOR PRIMARY CARE PHYSICIAN. Ask if Dr. Sharma is accepting new patients.                Plan Of Care/Follow-up recommendations:  Activity: as tolerated  Diet: heart healthy  Other: -Follow-up with your outpatient psychiatric provider -instructions on appointment date, time, and address (location) are provided to you in discharge paperwork.  -Take your psychiatric medications as prescribed at discharge - instructions are provided to you in the discharge paperwork  -Follow-up with outpatient primary  care doctor and other specialists -for management of chronic medical disease, including: parkinsonism with neurology, numerous medical conditions including diabetes with PCP, R rotator cuff injury with ortho  -Testing: Follow-up with outpatient provider for abnormal lab results: A1c in three months from initiation of glipizide  -Recommend abstinence from alcohol, tobacco, and other illicit drug use at discharge.   -If your psychiatric symptoms recur, worsen, or if you have side effects to your psychiatric medications, call your outpatient psychiatric provider, [phone] or go to the nearest emergency department.  -If suicidal thoughts recur, call your outpatient psychiatric provider, 911, [phone] or go to the nearest emergency department.   Justino Cornish, MD 11/10/2024, 7:58 AM

## 2024-11-09 NOTE — BHH Suicide Risk Assessment (Signed)
 Collateral contact - [name] (caretaker) [phone]   Caretaker said that he will pick up patient tomorrow, Friday, 1/2 at 11 AM.   Jeshua Ransford, LCSWA 11/10/2023

## 2024-11-09 NOTE — Progress Notes (Signed)
 Patient denies SI, HI, AVH. Patient stated they slept Good last night. Scored 0/10 on anxiety, feeling of hopelessness, and depression. Patient goal is Going home and states will Stay focus to help reach that goal. Patient has been calm, cooperative, and med compliant.     11/09/24 0900  Psych Admission Type (Psych Patients Only)  Admission Status Voluntary  Psychosocial Assessment  Patient Complaints None  Eye Contact Fair  Facial Expression Animated  Affect Appropriate to circumstance  Speech Logical/coherent;Soft;Slow  Interaction Assertive  Motor Activity Slow;Unsteady  Appearance/Hygiene Unremarkable  Behavior Characteristics Cooperative;Appropriate to situation  Mood Pleasant  Thought Process  Coherency WDL  Content WDL  Delusions None reported or observed  Perception WDL  Hallucination None reported or observed  Judgment Limited  Confusion None  Danger to Self  Current suicidal ideation? Denies  Agreement Not to Harm Self Yes  Description of Agreement Verbal  Danger to Others  Danger to Others None reported or observed

## 2024-11-09 NOTE — Group Note (Signed)
 Date:  11/09/2024 Time:  1:44 PM  Group Topic/Focus: Emotional Wellness Aims to help participants gain a deeper understanding of their anger and its underlying emotions. By exploring the iceberg model, the group focuses on recognizing that anger is often just the visible tip of a much larger emotional experience. The goal is to teach strategies for managing anger more effectively, promoting emotional awareness, and encouraging healthier emotional expression.     Participation Level:  Active  Participation Quality:  Appropriate, Attentive, and Sharing  Affect:  Appropriate  Cognitive:  Alert and Appropriate  Insight: Appropriate and Good  Engagement in Group:  Engaged  Modes of Intervention:  Discussion and Education  Additional Comments:  N/A  Lauris JONELLE Morales 11/09/2024, 1:44 PM

## 2024-11-09 NOTE — BHH Group Notes (Signed)
 Adult Psychoeducational Group Note  Date:  11/09/2024 Time:  9:07 PM  Group Topic/Focus:  Wrap-Up Group:   The focus of this group is to help patients review their daily goal of treatment and discuss progress on daily workbooks.  Participation Level:  Active  Participation Quality:  Attentive  Affect:  Appropriate  Cognitive:  Alert  Insight: Appropriate  Engagement in Group:  Engaged  Modes of Intervention:  Discussion  Additional Comments:  Patient attended and participated in the Wrap-up group.  Alicia Mayo 11/09/2024, 9:07 PM

## 2024-11-09 NOTE — Progress Notes (Signed)
" °  1:1 Nursing Note 11/09/24 0200  On assessment, patient is resting in her bed. Patient' eyes are closed while laying in the supine position. The patient is asleep. Respirations are even, unlabored, and the rise and fall of her chest is visible. There is no evidence of distress.  Patient remains on 1:1 for safety with a sitter at her side at all times. The patient is safe on this unit with q15 minutes safety checks. "

## 2024-11-09 NOTE — Plan of Care (Signed)
   Problem: Education: Goal: Knowledge of [facility] Education information/materials will improve Outcome: Progressing Goal: Emotional status will improve Outcome: Progressing Goal: Mental status will improve Outcome: Progressing Goal: Verbalization of understanding the information provided will improve Outcome: Progressing

## 2024-11-09 NOTE — Progress Notes (Signed)
 Conversation with patient:  Patient requested assistance with obtaining power of attorney.  CSW provided her with a phone number for [employer].   Derrica Sieg, LCSWA 11/09/2024

## 2024-11-09 NOTE — Progress Notes (Signed)
 [facility] Inpatient Psychiatry Progress Note  Date: 11/09/2024 Patient: Alicia Mayo MRN: [id number]  Assessment and Plan: YALITZA TEED is a 59 y.o. female admitted for auditory loose nations and akinetic crisis, which appear to have been triggered in part by Invega  injection.  Patient did not respond to discontinuation of offending agents including holding Latuda  and LAI (although unclear when she caught her LAI injection exactly and appears to have been during November per pharmacy dispense records).  Patient did improvement in motor symptoms when started on Sinemet .  This supports the differential more towards neurocognitive sorter including parkinsonian given the parkinsonism, worsening antipsychotics, pill roll tremor, hallucinations of kids and cats, and vivid dreams which are most consistent with Lewy body dementia but may also represent another parkinsonian disorder such as Parkinson's disease with dementia, vascular dementia with parkinsonism, or atypical parkinsonian disorder.  Patient has not been experiencing any major side effects on the carbidopa  levodopa  except for some possible nausea, which is chronic for her anyway, and is not noted to be experiencing any worsening hallucinations or mania or other psychiatric concerns on the Sinemet .  Other differentials for presentation including things that could be comorbid with no con disorder include delirium, primary psychotic disorder.  Patient has psychiatry and neurology follow-up at discharge.  Linagliptin  was added as second oral diabetes agent given patient's A1c at 9.7, per recommendation of diabetes coordinator.  Doing well back on 3 times daily Sinemet , mood is stable,   Alicia feels she is ready for discharge, so we will proceed with discharge tomorrow, when he is available to pick her up.      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, with psychotic disturbance (HCC) Ddx includes Lewy body  dementia, Parkinson's disease with dementia, vascular dementia with parkinsonism, atypical parkinsonian disorder, comorbid primary psychotic disorder, comorbid chronic delirium - Continue Sinemet  IR 25-100 mg tablet three times daily - Continue buspar  5 mg twice daily for anxiety - outpatient neurology referral  #Chronic pain / neuropathy # R shoulder pain -- Continue Subutex  2 mg sublingual daily -- lidocaine  patch x3 for three different affected joints --Outpatient orthopedic referral for right rotator cuff injury   #Asthma / Allergies -- Albuterol  -- Breo-Ellipta -- Flonase  -- Loratidine   #HTN / HLD -- Losartan  100 mg daily -- Metoprolol  50 mg daily -- Crestor  5 mg daily   #T2DM -- Metformin  1000 mg BID -- Added linagliptin  5 mg once daily, per diabetes coordinator recommendation   #GERD / IBS -- Protonix  -- Linzess    #Nicotine  withdrawal - Patient in need of nicotine  replacement; nicotine  patch 14 mg / 24 hours ordered. Smoking cessation encouraged  Risk Assessment - minimal-mild risk given medical conditions, history of severe mental illness and hospitalizations, difficulty in ADLs and protective factors include engagement in treatment, having caretakers at home    Discharge Planning Barriers to discharge: safe discharge when caretaker can come Estimated length of stay: 1/2 Predicted Discharge location: Home with caretaker, Alicia Mayo.     Interval History and update:  Reports she is ready to leave and go eat at [employer].  Denies any side effects of medications.  Reports she is ready to go.  Reports she has been walking in the last day.  Reports he is sleeping fine.  No concerns with her mood.      Physical Exam MSK/Neuro - sits up in bed wtihout assistance. No obvious focal neurologic deficits Mental Status Exam Appearance - Casually  dressed, appropriate hygiene and grooming, in bed Attitude - Calm, polite, not guarded Speech - normal volume,  prosody, inflection Mood - doing good I'm ready for some [employer]! Affect -appropriate, congruent, euthymic, full range Thought Process - linear, coherent Thought Content - logical. Future oriented. No delusions or paranoia.  SI/HI - Denies  Perceptions - Denies AVH; not RTIS including cats Judgement/Insight - Good  Fund of knowledge - WNL/borderline MCI Language - No impairments      Lab Results:  Admission on 10/28/2024  Component Date Value Ref Range Status   Hgb A1c MFr Bld 10/29/2024 9.7 (H)  4.8 - 5.6 % Final   Mean Plasma Glucose 10/29/2024 231.69  mg/dL Final   Cholesterol 87/78/7974 118  0 - 200 mg/dL Final   Triglycerides 87/78/7974 200 (H)  <150 mg/dL Final   HDL 87/78/7974 30 (L)  >40 mg/dL Final   Total CHOL/HDL Ratio 10/29/2024 3.9  RATIO Final   VLDL 10/29/2024 40  0 - 40 mg/dL Final   LDL Cholesterol 10/29/2024 48  0 - 99 mg/dL Final   Glucose-Capillary 10/29/2024 183 (H)  70 - 99 mg/dL Final   Glucose-Capillary 10/29/2024 126 (H)  70 - 99 mg/dL Final   Comment 1 87/78/7974 Notify RN   Final   Comment 2 10/29/2024 Document in Chart   Final   Vit D, 1,25-Dihydroxy 10/30/2024 15.5 (L)  24.8 - 81.5 pg/mL Final   Glucose-Capillary 10/30/2024 118 (H)  70 - 99 mg/dL Final   Glucose-Capillary 10/30/2024 197 (H)  70 - 99 mg/dL Final   Glucose-Capillary 10/30/2024 149 (H)  70 - 99 mg/dL Final   Glucose-Capillary 10/31/2024 112 (H)  70 - 99 mg/dL Final   Comment 1 87/76/7974 Notify RN   Final   Comment 2 10/31/2024 Document in Chart   Final   Glucose-Capillary 10/31/2024 117 (H)  70 - 99 mg/dL Final   Glucose-Capillary 10/31/2024 112 (H)  70 - 99 mg/dL Final   Total CK 87/76/7974 395 (H)  38 - 234 U/L Final   Glucose-Capillary 10/31/2024 115 (H)  70 - 99 mg/dL Final   Glucose-Capillary 11/01/2024 126 (H)  70 - 99 mg/dL Final   Specimen Source 11/02/2024 URINE, CLEAN CATCH   Final   Color, Urine 11/02/2024 YELLOW  YELLOW Final   APPearance 11/02/2024 CLEAR   CLEAR Final   Specific Gravity, Urine 11/02/2024 1.012  1.005 - 1.030 Final   pH 11/02/2024 6.0  5.0 - 8.0 Final   Glucose, UA 11/02/2024 NEGATIVE  NEGATIVE mg/dL Final   Hgb urine dipstick 11/02/2024 NEGATIVE  NEGATIVE Final   Bilirubin Urine 11/02/2024 NEGATIVE  NEGATIVE Final   Ketones, ur 11/02/2024 NEGATIVE  NEGATIVE mg/dL Final   Protein, ur 87/74/7974 NEGATIVE  NEGATIVE mg/dL Final   Nitrite 87/74/7974 NEGATIVE  NEGATIVE Final   Leukocytes,Ua 11/02/2024 SMALL (A)  NEGATIVE Final   RBC / HPF 11/02/2024 0-5  0 - 5 RBC/hpf Final   WBC, UA 11/02/2024 0-5  0 - 5 WBC/hpf Final   Bacteria, UA 11/02/2024 NONE SEEN  NONE SEEN Final   Squamous Epithelial / HPF 11/02/2024 0-5  0 - 5 /HPF Final   Mucus 11/02/2024 PRESENT   Final   Hyaline Casts, UA 11/02/2024 PRESENT   Final   Glucose-Capillary 11/01/2024 171 (H)  70 - 99 mg/dL Final   Glucose-Capillary 11/01/2024 125 (H)  70 - 99 mg/dL Final   Glucose-Capillary 11/02/2024 117 (H)  70 - 99 mg/dL Final   Glucose-Capillary 11/02/2024 130 (  H)  70 - 99 mg/dL Final   Comment 1 87/74/7974 Notify RN   Final   Glucose-Capillary 11/02/2024 156 (H)  70 - 99 mg/dL Final   Total CK 87/73/7974 135  38 - 234 U/L Final   Sodium 11/03/2024 140  135 - 145 mmol/L Final   Potassium 11/03/2024 3.4 (L)  3.5 - 5.1 mmol/L Final   Chloride 11/03/2024 102  98 - 111 mmol/L Final   CO2 11/03/2024 25  22 - 32 mmol/L Final   Glucose, Bld 11/03/2024 182 (H)  70 - 99 mg/dL Final   BUN 87/73/7974 10  6 - 20 mg/dL Final   Creatinine, Ser 11/03/2024 0.87  0.44 - 1.00 mg/dL Final   Calcium  11/03/2024 9.8  8.9 - 10.3 mg/dL Final   Total Protein 87/73/7974 7.9  6.5 - 8.1 g/dL Final   Albumin 87/73/7974 4.1  3.5 - 5.0 g/dL Final   AST 87/73/7974 15  15 - 41 U/L Final   ALT 11/03/2024 10  0 - 44 U/L Final   Alkaline Phosphatase 11/03/2024 72  38 - 126 U/L Final   Total Bilirubin 11/03/2024 0.5  0.0 - 1.2 mg/dL Final   GFR, Estimated 11/03/2024 >60  >60 mL/min Final    Anion gap 11/03/2024 13  5 - 15 Final   Glucose-Capillary 11/02/2024 163 (H)  70 - 99 mg/dL Final   Glucose-Capillary 11/03/2024 106 (H)  70 - 99 mg/dL Final   Glucose-Capillary 11/03/2024 171 (H)  70 - 99 mg/dL Final   Comment 1 87/73/7974 Notify RN   Final   Glucose-Capillary 11/03/2024 137 (H)  70 - 99 mg/dL Final   Comment 1 87/73/7974 Notify RN   Final   Glucose-Capillary 11/03/2024 164 (H)  70 - 99 mg/dL Final   Glucose-Capillary 11/04/2024 105 (H)  70 - 99 mg/dL Final   Glucose-Capillary 11/04/2024 156 (H)  70 - 99 mg/dL Final   Glucose-Capillary 11/04/2024 129 (H)  70 - 99 mg/dL Final   Glucose-Capillary 11/05/2024 108 (H)  70 - 99 mg/dL Final   Glucose-Capillary 11/05/2024 194 (H)  70 - 99 mg/dL Final   Glucose-Capillary 11/05/2024 175 (H)  70 - 99 mg/dL Final   Glucose-Capillary 11/05/2024 155 (H)  70 - 99 mg/dL Final   Glucose-Capillary 11/06/2024 97  70 - 99 mg/dL Final   Glucose-Capillary 11/06/2024 231 (H)  70 - 99 mg/dL Final   Glucose-Capillary 11/06/2024 122 (H)  70 - 99 mg/dL Final   Glucose-Capillary 11/06/2024 154 (H)  70 - 99 mg/dL Final   Glucose-Capillary 11/07/2024 111 (H)  70 - 99 mg/dL Final   Glucose-Capillary 11/07/2024 144 (H)  70 - 99 mg/dL Final   Glucose-Capillary 11/07/2024 130 (H)  70 - 99 mg/dL Final   Glucose-Capillary 11/07/2024 119 (H)  70 - 99 mg/dL Final   Glucose-Capillary 11/08/2024 133 (H)  70 - 99 mg/dL Final   Glucose-Capillary 11/08/2024 126 (H)  70 - 99 mg/dL Final   Glucose-Capillary 11/08/2024 106 (H)  70 - 99 mg/dL Final   Sodium 98/98/7973 141  135 - 145 mmol/L Final   Potassium 11/09/2024 3.8  3.5 - 5.1 mmol/L Final   Chloride 11/09/2024 102  98 - 111 mmol/L Final   CO2 11/09/2024 27  22 - 32 mmol/L Final   Glucose, Bld 11/09/2024 115 (H)  70 - 99 mg/dL Final   BUN 98/98/7973 9  6 - 20 mg/dL Final   Creatinine, Ser 11/09/2024 0.89  0.44 - 1.00 mg/dL Final  Calcium  11/09/2024 10.4 (H)  8.9 - 10.3 mg/dL Final   Total Protein  11/09/2024 8.0  6.5 - 8.1 g/dL Final   Albumin 98/98/7973 4.2  3.5 - 5.0 g/dL Final   AST 98/98/7973 22  15 - 41 U/L Final   ALT 11/09/2024 8  0 - 44 U/L Final   Alkaline Phosphatase 11/09/2024 78  38 - 126 U/L Final   Total Bilirubin 11/09/2024 0.3  0.0 - 1.2 mg/dL Final   GFR, Estimated 11/09/2024 >60  >60 mL/min Final   Anion gap 11/09/2024 12  5 - 15 Final   Glucose-Capillary 11/08/2024 122 (H)  70 - 99 mg/dL Final   Glucose-Capillary 11/09/2024 109 (H)  70 - 99 mg/dL Final     Vitals: Blood pressure 133/77, pulse 71, temperature 98 F (36.7 C), temperature source Oral, resp. rate 20, height 5' 7 (1.702 m), weight 96.4 kg, SpO2 100%.    Justino Cornish, MD

## 2024-11-10 DIAGNOSIS — F0392 Unspecified dementia, unspecified severity, with psychotic disturbance: Secondary | ICD-10-CM | POA: Diagnosis not present

## 2024-11-10 LAB — GLUCOSE, CAPILLARY: Glucose-Capillary: 89 mg/dL (ref 70–99)

## 2024-11-10 NOTE — Progress Notes (Signed)
 Pt discharged to lobby. Pt was stable and appreciative at that time. All papers and prescriptions were given and valuables returned. Verbal understanding expressed. Denies SI/HI and A/VH. Pt given opportunity to express concerns and ask questions.

## 2024-11-10 NOTE — Progress Notes (Signed)
 Patient denies SI, HI, AVH. Patient stated they slept Good last night. Scored zero on anxiety, feeling of hopelessness, and depression. Patient goal is Going home and states will Go home to help reach that goal. Patient has been calm, cooperative, and med compliant.     11/10/24 0800  Psych Admission Type (Psych Patients Only)  Admission Status Voluntary  Psychosocial Assessment  Patient Complaints None  Eye Contact Fair  Facial Expression Animated  Affect Appropriate to circumstance  Speech Logical/coherent  Interaction Assertive  Motor Activity Slow;Unsteady  Appearance/Hygiene Unremarkable  Behavior Characteristics Cooperative;Appropriate to situation;Calm  Mood Pleasant  Thought Process  Coherency WDL  Content WDL  Delusions None reported or observed  Perception WDL  Hallucination None reported or observed  Judgment Limited  Confusion None  Danger to Self  Current suicidal ideation? Denies  Agreement Not to Harm Self Yes  Description of Agreement Verbal  Danger to Others  Danger to Others None reported or observed

## 2024-11-10 NOTE — Discharge Instructions (Signed)
-  Follow-up with your outpatient psychiatric provider (and therapist) -instructions on appointment date, time, and address (location) are provided to you in discharge paperwork.  -Take your psychiatric medications as prescribed at discharge - instructions are provided to you in the discharge paperwork  -Follow-up with outpatient primary care doctor and other specialists -for management of preventative medicine and any chronic medical disease.  -Recommend abstinence from alcohol, tobacco, cannabis, and other substances at discharge.   -If your psychiatric symptoms recur, worsen, or if you have severe side effects to your psychiatric medications, call your outpatient psychiatric provider, 911, 988 (national suicide hotline), go to [facility], or go to the nearest emergency department.  -If suicidal thoughts occur, call your outpatient psychiatric provider, 911, 988 (national suicide hotline), go to [facility], or go to the nearest emergency department.  Naloxone (Narcan) can help reverse an overdose when given to the victim quickly.  [facility] offers free naloxone kits and instructions/training on its use.  Add naloxone to your first aid kit and you can help save a life.   Pick up your free kit at the following locations:   [place]:  [facility], [address] [place] [address] [phone]) [facility] [address] [place] [address] [phone]) [facility] [address] [place] [address]  [place]: [facility] of [facility] [address] [place] [address] ([phone]) [facility] [address] [place] [address] [phone])

## 2024-11-10 NOTE — Progress Notes (Signed)
" °  [facility] Adult Case Management Discharge Plan :  Will you be returning to the same living situation after discharge:  Yes,  pt returning back home at discharge At discharge, do you have transportation home?: Yes,  pt will be picked up by her caregiver [name] at 11AM Do you have the ability to pay for your medications: Yes,  pt has active health insurance coverage  Release of information consent forms completed and in the chart;  Patient's signature needed at discharge.  Patient to Follow up at:  Follow-up Information     [facility]. Go on 01/18/2025.   Why: You have an appointment to establish care for behavioral health services 01/19/24 at 10:40 am, in person.  * Please also call periodically to check for cancellations, for a sooner appointment. * Please confirm your appointment by 2 days prior. Contact information: [facility]  [address] [place], [place] [address]  P:  [phone]        [facility] Follow up on 11/10/2024.   Why: You have a hospital follow up appointment for therapy services on 11/11/23 at 10:00 am.  The appointment will be Virtual, telehealth.  Medication management services is also available.  If you miss this appointment, please call to reschedule. Contact information: [address]  [address] [place] [address] [phone]         [facility] Follow up.   Specialty: Neurology Why: PLEASE CALL TO MAKE APPOINTMENT ASAP. REFERRAL HAS BEEN SENT. Contact information: [address] [place]  [phone]        [facility] Follow up.   Specialty: Orthopedics Why: CALL TO MAKE AN APPOINTMENT ASAP. REFERRAL HAS BEEN SENT. Contact information: [address] [place]  [address] [phone]        [facility] Follow up.   Why: CALL [phone] TO MAKE APPOINTMENT FOR  PRIMARY CARE PHYSICIAN. Ask if Dr. Sharma is accepting new patients.                Next level of care provider has access to [facility] Link:no  Safety Planning and Suicide Prevention discussed: Yes,  [name] [phone] (caretaker)     Has patient been referred to the Quitline?: Patient refused referral for treatment  Patient has been referred for addiction treatment: No known substance use disorder.  Jenkins LULLA Primer, LCSWA 11/10/2024, 8:42 AM "

## 2024-11-10 NOTE — Group Note (Signed)
 Date:  11/10/2024 Time:  10:54 AM  Group Topic/Focus: Goals Group  Patients were invited to introduce themselves using an icebreaker question about their New Year's resolutions. Following this, patients received a goals worksheet designed to help them outline their objectives for the upcoming week, month, and year. Participants were encouraged to share their goals with the group, along with any obstacles they anticipate facing. The group setting provided a safe and supportive space, fostering an environment where patients felt comfortable expressing themselves and engaging in open discussion.    Participation Level:  Did Not Attend  Additional Comments:  Pt did not attend goals group  Kristi HERO [facility] 11/10/2024, 10:54 AM

## 2024-11-10 NOTE — Plan of Care (Signed)

## 2024-11-10 NOTE — Plan of Care (Signed)

## 2024-11-10 NOTE — BH Assessment (Signed)
(  Sleep Hours) - 6.75 (Any PRNs that were needed, meds refused, or side effects to meds)-  (Any disturbances and when (visitation, over night)- None (Concerns raised by the patient)- None (SI/HI/AVH)- Denies

## 2024-11-15 ENCOUNTER — Ambulatory Visit

## 2024-11-22 ENCOUNTER — Ambulatory Visit (INDEPENDENT_AMBULATORY_CARE_PROVIDER_SITE_OTHER): Admitting: Family Medicine

## 2024-11-22 ENCOUNTER — Encounter: Payer: Self-pay | Admitting: Family Medicine

## 2024-11-22 VITALS — BP 138/60 | HR 89 | Ht 67.0 in | Wt 209.0 lb

## 2024-11-22 DIAGNOSIS — E1165 Type 2 diabetes mellitus with hyperglycemia: Secondary | ICD-10-CM | POA: Diagnosis not present

## 2024-11-22 DIAGNOSIS — I69351 Hemiplegia and hemiparesis following cerebral infarction affecting right dominant side: Secondary | ICD-10-CM

## 2024-11-22 DIAGNOSIS — J438 Other emphysema: Secondary | ICD-10-CM

## 2024-11-22 DIAGNOSIS — I69319 Unspecified symptoms and signs involving cognitive functions following cerebral infarction: Secondary | ICD-10-CM

## 2024-11-22 DIAGNOSIS — E1142 Type 2 diabetes mellitus with diabetic polyneuropathy: Secondary | ICD-10-CM

## 2024-11-22 DIAGNOSIS — G4730 Sleep apnea, unspecified: Secondary | ICD-10-CM | POA: Diagnosis not present

## 2024-11-22 DIAGNOSIS — N3946 Mixed incontinence: Secondary | ICD-10-CM | POA: Diagnosis not present

## 2024-11-22 DIAGNOSIS — M25511 Pain in right shoulder: Secondary | ICD-10-CM

## 2024-11-22 DIAGNOSIS — F03B2 Unspecified dementia, moderate, with psychotic disturbance: Secondary | ICD-10-CM

## 2024-11-22 DIAGNOSIS — G894 Chronic pain syndrome: Secondary | ICD-10-CM | POA: Diagnosis not present

## 2024-11-22 DIAGNOSIS — E785 Hyperlipidemia, unspecified: Secondary | ICD-10-CM | POA: Diagnosis not present

## 2024-11-22 DIAGNOSIS — I10 Essential (primary) hypertension: Secondary | ICD-10-CM | POA: Diagnosis not present

## 2024-11-22 DIAGNOSIS — F251 Schizoaffective disorder, depressive type: Secondary | ICD-10-CM

## 2024-11-22 DIAGNOSIS — Z794 Long term (current) use of insulin: Secondary | ICD-10-CM | POA: Diagnosis not present

## 2024-11-22 DIAGNOSIS — J42 Unspecified chronic bronchitis: Secondary | ICD-10-CM

## 2024-11-22 DIAGNOSIS — G8929 Other chronic pain: Secondary | ICD-10-CM

## 2024-11-22 DIAGNOSIS — Z8673 Personal history of transient ischemic attack (TIA), and cerebral infarction without residual deficits: Secondary | ICD-10-CM

## 2024-11-22 DIAGNOSIS — F1121 Opioid dependence, in remission: Secondary | ICD-10-CM

## 2024-11-22 NOTE — Progress Notes (Signed)
 "  New Patient Office Visit  Patient ID: Alicia Mayo, Female   DOB: 1966-09-12 59 y.o. MRN: [id number]  Chief Complaint  Patient presents with   Establish Care   Hospitalization Follow-up   Subjective:     Alicia Mayo presents to establish care  HPI  Discussed the use of AI scribe software for clinical note transcription with the patient, who gave verbal consent to proceed.  History of Present Illness [name] is a 59 year old female with Parkinson's disease and early dementia who presents to establish care and address medication needs.  She has Parkinson's disease and early dementia, currently managed with Sinemet  three times a day, though she is unsure of the exact dosing. Her medications are organized in a bubble pack by her nurse. She has a significant number of medications to take both in the morning and at night.  She has type 2 diabetes managed with insulin  therapy, taking 32 units of Lantus  twice daily and 14 units of NovoLog  three times a day, adjusting the dose based on blood sugar levels. Her fasting blood sugar ranges from 171 to 270 mg/dL, with occasional lows, the most recent being 68 mg/dL two weeks ago. Her A1c was 9.7% three weeks ago, down from 10.3% last month. She also takes metformin  and Tradjenta .  She has a history of COPD and uses an inhaler twice daily, which she finds helpful. She smokes about a packet a day.  She experiences chronic pain, particularly in her right shoulder. She uses lidocaine  for pain management. She has a history of seizures but has not had one in about two years. She was previously on multiple psychotropic medications, which have been discontinued.  She reports issues with incontinence and requires bed pads, Depends, gloves, and wipes, which she ran out of after a recent hospital stay. She also takes Ensure, sometimes up to four times a day, especially when she lacks appetite.  She has a history of sleep apnea, with reports of  stopping breathing 14 times during sleep, but has not been formally tested. She experiences nausea, for which she takes Zofran , and constipation, managed with lactulose  and Miralax .  She has an upcoming appointment with a psychiatrist. She was previously on multiple narcotics, which have been reduced.   Outpatient Encounter Medications as of 11/22/2024  Medication Sig   albuterol  (VENTOLIN  HFA) 108 (90 Base) MCG/ACT inhaler Inhale 2 puffs into the lungs every 4 (four) hours as needed.   ammonium lactate  (AMLACTIN) 12 % cream Apply 1 Application topically 2 (two) times daily as needed for dry skin (apply to affected areas).   fexofenadine (ALLEGRA) 180 MG tablet Take 180 mg by mouth daily.   fluticasone -salmeterol (ADVAIR) 250-50 MCG/ACT AEPB Inhale 1 puff into the lungs 2 (two) times daily.   furosemide  (LASIX ) 20 MG tablet Take 20 mg by mouth daily.   Glucose Blood (BLOOD GLUCOSE TEST STRIPS) STRP 1 each by Does not apply route as directed. Dispense based on patient and insurance preference. Use up to four times daily as directed. (FOR ICD-10 E10.9, E11.9).   insulin  aspart (NOVOLOG ) 100 UNIT/ML injection Inject 14 Units into the skin 3 (three) times daily before meals.   insulin  glargine (LANTUS ) 100 UNIT/ML injection Inject 32 Units into the skin 2 (two) times daily.   lactulose  (CHRONULAC ) 10 GM/15ML solution Take 45 mLs (30 g total) by mouth daily as needed for moderate constipation or severe constipation.   Lancet Device MISC 1 each by Does  not apply route as directed. Dispense based on patient and insurance preference. Use up to four times daily as directed. (FOR ICD-10 E10.9, E11.9).   Lancets MISC 1 each by Does not apply route as directed. Dispense based on patient and insurance preference. Use up to four times daily as directed. (FOR ICD-10 E10.9, E11.9).   lidocaine  (LIDODERM ) 5 % Place 1 patch onto the skin daily.   LINZESS  290 MCG CAPS capsule Take 290 mcg by mouth daily.   losartan   (COZAAR ) 100 MG tablet Take 100 mg by mouth daily.   metFORMIN  (GLUCOPHAGE -XR) 500 MG 24 hr tablet Take 1,000 mg by mouth 2 (two) times daily.   nicotine  (NICODERM CQ  - DOSED IN MG/24 HOURS) 14 mg/24hr patch Place 1 patch (14 mg total) onto the skin daily.   ondansetron  (ZOFRAN -ODT) 4 MG disintegrating tablet Take 1 tablet (4 mg total) by mouth every 8 (eight) hours as needed for nausea or vomiting.   polyethylene glycol (MIRALAX  / GLYCOLAX ) 17 g packet Take 17 g by mouth daily as needed.   rosuvastatin  (CRESTOR ) 5 MG tablet Take 5 mg by mouth daily.   trospium (SANCTURA) 20 MG tablet Take 20 mg by mouth 2 (two) times daily.   [DISCONTINUED] Alcohol  Swabs  70 % PADS Apply 1 each topically in the morning, at noon, and at bedtime.   [DISCONTINUED] Alcohol  Swabs  PADS 1 each by Does not apply route in the morning, at noon, and at bedtime.   [DISCONTINUED] Insulin  Pen Needle (ULTICARE MICRO PEN NEEDLES) 32G X 4 MM MISC 1 each by Other route in the morning, at noon, in the evening, and at bedtime.   Alcohol  Swabs  70 % PADS Apply 1 each topically in the morning, at noon, and at bedtime.   buprenorphine  (SUBUTEX ) 2 MG SUBL SL tablet Place 1 tablet (2 mg total) under the tongue daily.   carbidopa -levodopa  (SINEMET  IR) 25-100 MG tablet Take 1 tablet by mouth 3 (three) times daily.   Insulin  Pen Needle (ULTICARE MICRO PEN NEEDLES) 32G X 4 MM MISC 1 each by Other route in the morning, at noon, in the evening, and at bedtime.   linagliptin  (TRADJENTA ) 5 MG TABS tablet Take 1 tablet (5 mg total) by mouth daily.   metoprolol  tartrate (LOPRESSOR ) 50 MG tablet Take 50 mg by mouth 2 (two) times daily.   Vitamin D , Ergocalciferol , (DRISDOL ) 1.25 MG (50000 UNIT) CAPS capsule Take 1 capsule (50,000 Units total) by mouth every 7 (seven) days. On Mondays.   No facility-administered encounter medications on file as of 11/22/2024.    Past Medical History:  Diagnosis Date   Asthma    COPD (chronic obstructive pulmonary  disease) (HCC)    Diabetes mellitus without complication (HCC)    Fibromyalgia    Hypertension    Stroke [facility]) 2023   2023, 2008 and others. 8 strokes. R hemiparesis    Past Surgical History:  Procedure Laterality Date   APPENDECTOMY     CHOLECYSTECTOMY      History reviewed. No pertinent family history.  Social History   Socioeconomic History   Marital status: Single    Spouse name: Not on file   Number of children: Not on file   Years of education: Not on file   Highest education level: Not on file  Occupational History   Not on file  Tobacco Use   Smoking status: Every Day    Current packs/day: 1.00    Average packs/day: 1 pack/day for 43.0 years (43.0 ttl  pk-yrs)    Types: Cigarettes    Start date: 2   Smokeless tobacco: Never  Vaping Use   Vaping status: Never Used  Substance and Sexual Activity   Alcohol  use: Not Currently   Drug use: Never   Sexual activity: Not Currently  Other Topics Concern   Not on file  Social History Narrative   Not on file   Social Drivers of Health   Tobacco Use: High Risk (11/22/2024)   Patient History    Smoking Tobacco Use: Every Day    Smokeless Tobacco Use: Never    Passive Exposure: Not on file  Financial Resource Strain: Low Risk  (08/31/2023)   Received from [facility]   Overall Financial Resource Strain (CARDIA)    Difficulty of Paying Living Expenses: Not very hard  Food Insecurity: No Food Insecurity (10/28/2024)   Epic    Worried About Running Out of Food in the Last Year: Never true    Ran Out of Food in the Last Year: Never true  Transportation Needs: No Transportation Needs (10/28/2024)   Epic    Lack of Transportation (Medical): No    Lack of Transportation (Non-Medical): No  Physical Activity: Inactive (11/03/2022)   Received from [facility]   Exercise Vital Sign    On average, how many days per week do you engage in moderate to strenuous exercise (like a brisk  walk)?: 0 days    On average, how many minutes do you engage in exercise at this level?: 0 min  Stress: No Stress Concern Present (11/03/2022)   Received from [facility] - Occupational Stress Questionnaire    Feeling of Stress : Only a little  Social Connections: Socially Isolated (11/03/2022)   Received from [facility]   Social Connection and Isolation Panel    In a typical week, how many times do you talk on the phone with family, friends, or neighbors?: More than three times a week    How often do you get together with friends or relatives?: Twice a week    How often do you attend church or religious services?: Never    Do you belong to any clubs or organizations such as church groups, unions, fraternal or athletic groups, or school groups?: No    How often do you attend meetings of the clubs or organizations you belong to?: Never    Are you married, widowed, divorced, separated, never married, or living with a partner?: Divorced  Intimate Partner Violence: Not At Risk (10/28/2024)   Epic    Fear of Current or Ex-Partner: No    Emotionally Abused: No    Physically Abused: No    Sexually Abused: No  Depression (PHQ2-9): Medium Risk (11/22/2024)   Depression (PHQ2-9)    PHQ-2 Score: 8  Alcohol  Screen: Low Risk (10/28/2024)   Alcohol  Screen    Last Alcohol  Screening Score (AUDIT): 0  Housing: Low Risk (10/28/2024)   Epic    Unable to Pay for Housing in the Last Year: No    Number of Times Moved in the Last Year: 0    Homeless in the Last Year: No  Utilities: Not At Risk (10/28/2024)   Epic    Threatened with loss of utilities: No  Health Literacy: Not on file    Review of Systems  All other systems reviewed and are negative.     Objective:    BP 138/60  Pulse 89   Ht 5' 7 (1.702 m)   Wt 209 lb (94.8 kg)   SpO2 98%   BMI 32.73 kg/m   Physical Exam Vitals and nursing note reviewed.   Constitutional:      Appearance: Normal appearance.  HENT:     Head: Normocephalic.     Right Ear: External ear normal.     Left Ear: External ear normal.  Eyes:     Conjunctiva/sclera: Conjunctivae normal.  Cardiovascular:     Rate and Rhythm: Normal rate.  Pulmonary:     Effort: Pulmonary effort is normal. No respiratory distress.  Abdominal:     Palpations: Abdomen is soft.  Musculoskeletal:        General: Normal range of motion.  Skin:    General: Skin is warm.  Neurological:     Mental Status: She is alert and oriented to person, place, and time.  Psychiatric:        Mood and Affect: Mood normal.    Physical Exam          Assessment & Plan:   Problem List Items Addressed This Visit       Cardiovascular and Mediastinum   Essential hypertension - Primary     Respiratory   COPD (chronic obstructive pulmonary disease) (HCC)   Sleep apnea   Other emphysema (HCC)     Endocrine   Uncontrolled type 2 diabetes mellitus with hyperglycemia, with long-term current use of insulin  (HCC)   Relevant Medications   insulin  glargine (LANTUS ) 100 UNIT/ML injection   insulin  aspart (NOVOLOG ) 100 UNIT/ML injection   Other Relevant Orders   Ambulatory referral to Endocrinology   For home use only DME Other see comment     Nervous and Auditory   Cognitive deficit S/P CVA (cerebrovascular accident)   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, with psychotic disturbance (HCC)     Other   History of multiple strokes   Right shoulder pain   Dyslipidemia   Opiate dependence (HCC)   Urinary incontinence   Relevant Orders   For home use only DME Other see comment   Chronic pain syndrome   Other Visit Diagnoses       Hemiplegia and hemiparesis following cerebral infarction affecting right dominant side (HCC)         Type 2 diabetes mellitus with polyneuropathy (HCC)       Relevant Medications   Glucose Blood (BLOOD GLUCOSE TEST STRIPS) STRP    Lancet Device MISC   Lancets MISC   insulin  glargine (LANTUS ) 100 UNIT/ML injection   insulin  aspart (NOVOLOG ) 100 UNIT/ML injection     Schizoaffective disorder, depressive type (HCC)       Relevant Orders   For home use only DME Other see comment       Assessment and Plan Assessment & Plan Type 2 diabetes mellitus with hyperglycemia Type 2 diabetes with hyperglycemia, managed with insulin  and oral medications. Blood glucose levels range from 171 mg/dL fasting to 692 mg/dL postprandial. J8r improved from 10.3% to 9.7% over the past three weeks. Recent insulin  regimen adjustment by previous provider. - Ordered blood work to assess current glucose control. - Documented insulin  regimen: 32 units of Lantus  twice daily and 14 units of NovoLog  three times daily. - Educated on proper timing of blood glucose checks postprandially. - Requested medication list from pharmacy for accurate documentation. - Will schedule endocrinology appointment for further diabetes management.  Dementia with behavioral disturbance Recent psychiatric evaluation with  plans for medication adjustment. - Continue follow-up with psychiatrist for medication management and support group referrals.  Parkinson's disease Managed with Sinemet . Recent medication adjustments due to polypharmacy concerns. - Ensure Sinemet  is taken three times daily.  Chronic pain syndrome Managed with lidocaine  and possibly other unspecified medications. Recent hospitalization led to medication adjustments. - Attend [facility] appointment for pain management evaluation.  Chronic right shoulder pain Chronic right shoulder pain, possibly requiring surgical intervention. - Attend orthopedic appointment for evaluation and potential surgical intervention.  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 - Prescribed bed pads and other necessary  supplies.  Essential hypertension Managed with losartan  and metoprolol .  Sleep apnea Diagnosed with sleep apnea, with reported apneic episodes during sleep. - Encouraged completion of sleep study for further evaluation.  Chronic obstructive pulmonary disease Managed with inhaler use twice daily. Smoking cessation efforts ongoing with nicotine  patch. - Continue inhaler use twice daily. - Increase nicotine  patch dosage for smoking cessation.         No follow-ups on file.   Markus Casten K Keala Drum, MD [facility] at [facility]     "

## 2024-11-23 ENCOUNTER — Ambulatory Visit

## 2024-11-23 ENCOUNTER — Telehealth: Payer: Self-pay

## 2024-11-23 MED ORDER — LANCETS MISC: 1.0000 | 0 refills | Status: AC

## 2024-11-23 MED ORDER — BLOOD GLUCOSE TEST VI STRP: 1.0000 | ORAL_STRIP | 0 refills | Status: AC

## 2024-11-23 MED ORDER — LANCET DEVICE MISC: 1.0000 | 0 refills | Status: AC

## 2024-11-23 MED ORDER — ALCOHOL SWABS PADS: 1.0000 | MEDICATED_PAD | Freq: Three times a day (TID) | 9 refills | Status: DC

## 2024-11-23 NOTE — Telephone Encounter (Signed)
 Called patient's caregiver lynwood to discuss patient's current medication list and to obtain a copy of her list per Dr. Quenton instructions. There was no answer to my phone call, voicemail message was left for [name] to call back.

## 2024-11-24 MED ORDER — ALCOHOL SWABS 70 % PADS: 1.0000 | MEDICATED_PAD | Freq: Three times a day (TID) | 11 refills | Status: DC

## 2024-11-24 MED ORDER — ULTICARE MICRO PEN NEEDLES 32G X 4 MM MISC: 1.0000 | Freq: Four times a day (QID) | 11 refills | Status: AC

## 2024-12-07 ENCOUNTER — Other Ambulatory Visit: Payer: Self-pay | Admitting: Family Medicine

## 2024-12-07 ENCOUNTER — Ambulatory Visit: Admitting: Family Medicine

## 2024-12-07 NOTE — Telephone Encounter (Signed)
 Copied from CRM #[id number]. Topic: Clinical - Medication Refill >> Dec 07, 2024  3:59 PM Olam RAMAN wrote: Medication: linagliptin  (TRADJENTA ) 5 MG TABS tablet carbidopa -levodopa  (SINEMET  IR) 25-100 MG tablet   Has the patient contacted their pharmacy? Yes (Agent: If no, request that the patient contact the pharmacy for the refill. If patient does not wish to contact the pharmacy document the reason why and proceed with request.) (Agent: If yes, when and what did the pharmacy advise?)  This is the patient's preferred pharmacy:  [facility] - [place], [place] - [address] [place] [address] Phone: [phone] Fax: [phone]  Is this the correct pharmacy for this prescription? Yes If no, delete pharmacy and type the correct one.   Has the prescription been filled recently? Yes  Is the patient out of the medication? Yes  Has the patient been seen for an appointment in the last year OR does the patient have an upcoming appointment? Yes  Can we respond through MyChart? No  Agent: Please be advised that Rx refills may take up to 3 business days. We ask that you follow-up with your pharmacy.

## 2024-12-08 ENCOUNTER — Encounter: Payer: Self-pay | Admitting: Family Medicine

## 2024-12-08 ENCOUNTER — Ambulatory Visit (INDEPENDENT_AMBULATORY_CARE_PROVIDER_SITE_OTHER): Admitting: Family Medicine

## 2024-12-08 VITALS — BP 100/70 | HR 87 | Temp 97.6°F | Ht 67.0 in | Wt 215.2 lb

## 2024-12-08 DIAGNOSIS — E1165 Type 2 diabetes mellitus with hyperglycemia: Secondary | ICD-10-CM

## 2024-12-08 DIAGNOSIS — Z794 Long term (current) use of insulin: Secondary | ICD-10-CM

## 2024-12-08 DIAGNOSIS — I69351 Hemiplegia and hemiparesis following cerebral infarction affecting right dominant side: Secondary | ICD-10-CM

## 2024-12-08 DIAGNOSIS — R32 Unspecified urinary incontinence: Secondary | ICD-10-CM | POA: Diagnosis not present

## 2024-12-08 MED ORDER — ALCOHOL SWABS 70 % PADS: 1.0000 | MEDICATED_PAD | Freq: Three times a day (TID) | 11 refills | Status: AC

## 2024-12-08 NOTE — Progress Notes (Signed)
 "  Established Patient Office Visit  Patient ID: Alicia Mayo, female    DOB: 21-Feb-1966  Age: 59 y.o. MRN: [id number] PCP: Marijane Trower K, MD  Chief Complaint  Patient presents with   COPD   Diabetes    Subjective:     HPI  Discussed the use of AI scribe software for clinical note transcription with the patient, who gave verbal consent to proceed.  History of Present Illness Alicia Mayo is a 59 year old female with diabetes who presents for follow-up regarding her insulin  management and supplies.  She reports issues with her insulin  regimen and states that she is out of Depends, bed pads, gloves, and alcohol  wipes. Her prescription for Depends  was supposed to be sent and faxed to [facility], and she has the necessary contact information for this process.  Her current diabetes management includes taking Novolog  three times a day, 18 units with a sliding scale adjustment of two additional units if her blood sugar is over 225. She also takes Lantus , which she has been adjusting herself due to supply issues. Previously, she was taking 52 units twice daily but reduced to 32 units due to low supply. She is now taking approximately 30 units twice daily. She monitors her blood sugar daily, with a recent reading of 216 mg/dL. She has experienced low blood sugar episodes in the past, with levels dropping into the 40s and 50s at night, and even into the 20s on occasion. She uses a Dexcom G7 for continuous glucose monitoring.  Her nutritional intake includes drinking Ensure three to four times a day, especially when she is not hungry or if her blood sugar is low. She drinks Ensure upon waking, sometimes in place of lunch, and at night before bed. If she wakes up with low blood sugar, she drinks Ensure to stabilize her levels.  No recent episodes of low blood sugar. She is scheduled to see an endocrinologist at [facility] next month.     Review of Systems  All other systems reviewed and are  negative.     Objective:     BP 100/70   Pulse 87   Temp 97.6 F (36.4 C) (Oral)   Ht 5' 7 (1.702 m)   Wt 215 lb 4 oz (97.6 kg)   SpO2 99%   BMI 33.71 kg/m  BP Readings from Last 3 Encounters:  12/08/24 100/70  11/22/24 138/60  10/28/24 (!) 144/60   Wt Readings from Last 3 Encounters:  12/08/24 215 lb 4 oz (97.6 kg)  11/22/24 209 lb (94.8 kg)  10/27/24 224 lb (101.6 kg)      Physical Exam Vitals and nursing note reviewed.  Constitutional:      Appearance: Normal appearance.  HENT:     Head: Normocephalic.     Right Ear: External ear normal.     Left Ear: External ear normal.  Eyes:     Conjunctiva/sclera: Conjunctivae normal.  Cardiovascular:     Rate and Rhythm: Normal rate.  Pulmonary:     Effort: Pulmonary effort is normal. No respiratory distress.  Abdominal:     Palpations: Abdomen is soft.  Musculoskeletal:        General: Normal range of motion.  Skin:    General: Skin is warm.  Neurological:     Mental Status: She is alert and oriented to person, place, and time.  Psychiatric:        Mood and Affect: Mood normal.     Physical  Exam     No results found for any visits on 12/08/24.     The ASCVD Risk score (Arnett DK, et al., 2019) failed to calculate for the following reasons:   Risk score cannot be calculated because patient has a medical history suggesting prior/existing ASCVD   * - Cholesterol units were assumed    Assessment & Plan:   Problem List Items Addressed This Visit       Endocrine   Uncontrolled type 2 diabetes mellitus with hyperglycemia, with long-term current use of insulin  (HCC)   Relevant Orders   For home use only DME Other see comment     Other   Urinary incontinence - Primary   Relevant Orders   For home use only DME Other see comment   Other Visit Diagnoses       Hemiplegia and hemiparesis following cerebral infarction affecting right dominant side (HCC)       Relevant Orders   For home use only DME  Other see comment       Assessment and Plan Assessment & Plan Type 2 diabetes mellitus with hyperglycemia Blood glucose levels elevated at 216 mg/dL. Current insulin  regimen includes Novolog  and Lantus . She independently adjusted Lantus  due to hypoglycemia concerns, with lows in the twenties. Uses Dexcom G7 for monitoring. Goal: fasting glucose in 130s, postprandial in 180s. Hypoglycemia risk due to stroke history. - Increase Lantus  to 34 units twice daily for two days, then increase by 2 units every two days until fasting glucose is in the 130s. - Monitor glucose levels closely for hypoglycemia. - Attend endocrinology appointment on February 10th. - Avoid self-adjusting insulin  without guidance.  Mixed stress and urge urinary incontinence Requires supplies including Depends, bed pads, wipes, and gloves. Running low on supplies. - Faxed prescription for supplies to [facility]. - Ensure receipt of two boxes of bed pads per month.    No follow-ups on file.    Goldman Birchall K Emmalee Solivan, MD [facility] at [facility]   "

## 2024-12-08 NOTE — Telephone Encounter (Signed)
 Requested medication (s) are due for refill today: Yes  Requested medication (s) are on the active medication list: Yes  Last refill:  11/09/24  Future visit scheduled:   Notes to clinic:  Last refilled by Dr. Cornelius.    Requested Prescriptions  Pending Prescriptions Disp Refills   carbidopa -levodopa  (SINEMET  IR) 25-100 MG tablet 90 tablet 0    Sig: Take 1 tablet by mouth 3 (three) times daily.     Neurology:  Parkinsonian Agents 2 Failed - 12/08/2024  2:12 PM      Failed - WBC in normal range and within 360 days    WBC  Date Value Ref Range Status  10/27/2024 10.6 (H) 4.0 - 10.5 K/uL Final         Failed - HGB in normal range and within 360 days    Hemoglobin  Date Value Ref Range Status  10/27/2024 10.9 (L) 12.0 - 15.0 g/dL Final         Failed - HCT in normal range and within 360 days    HCT  Date Value Ref Range Status  10/27/2024 33.0 (L) 36.0 - 46.0 % Final         Passed - PLT in normal range and within 360 days    Platelets  Date Value Ref Range Status  10/27/2024 199 150 - 400 K/uL Final         Passed - Cr in normal range and within 360 days    Creatinine, Ser  Date Value Ref Range Status  11/09/2024 0.89 0.44 - 1.00 mg/dL Final         Passed - AST in normal range and within 360 days    AST  Date Value Ref Range Status  11/09/2024 22 15 - 41 U/L Final         Passed - ALT in normal range and within 360 days    ALT  Date Value Ref Range Status  11/09/2024 8 0 - 44 U/L Final         Passed - Last BP in normal range    BP Readings from Last 1 Encounters:  12/08/24 100/70         Passed - Valid encounter within last 12 months    Recent Outpatient Visits           Today Urinary incontinence, unspecified type   [facility] at [facility], Vinay K, MD   2 weeks ago Essential hypertension   [place] [facility] at [facility], Vinay K, MD       Future  Appointments             In 1 month Penumalli, Eduard SAUNDERS, MD [facility]             linagliptin  (TRADJENTA ) 5 MG TABS tablet 30 tablet 0    Sig: Take 1 tablet (5 mg total) by mouth daily.     Endocrinology:  Diabetes - DPP-4 Inhibitors - linagliptin  Failed - 12/08/2024  2:12 PM      Failed - HBA1C is between 0 and 7.9 and within 180 days    Hgb A1c MFr Bld  Date Value Ref Range Status  10/29/2024 9.7 (H) 4.8 - 5.6 % Final    Comment:    (NOTE) Diagnosis of Diabetes The following HbA1c ranges recommended by the American Diabetes Association (ADA) may be used as an aid in the diagnosis  of diabetes mellitus.  Hemoglobin             Suggested A1C NGSP%              Diagnosis  <5.7                   Non Diabetic  5.7-6.4                Pre-Diabetic  >6.4                   Diabetic  <7.0                   Glycemic control for                       adults with diabetes.           Passed - Valid encounter within last 6 months    Recent Outpatient Visits           Today Urinary incontinence, unspecified type   [facility] at [facility], Vinay K, MD   2 weeks ago Essential hypertension   [facility] Primary Care [facility] Medicine at [facility], Vinay K, MD       Future Appointments             In 1 month Penumalli, Eduard SAUNDERS, MD [facility]

## 2024-12-08 NOTE — Telephone Encounter (Signed)
 Please review medication refill request

## 2024-12-10 MED ORDER — CARBIDOPA-LEVODOPA 25-100 MG PO TABS: 1.0000 | ORAL_TABLET | Freq: Three times a day (TID) | ORAL | 0 refills | Status: AC

## 2024-12-10 MED ORDER — LINAGLIPTIN 5 MG PO TABS: 5.0000 mg | ORAL_TABLET | Freq: Every day | ORAL | 0 refills | Status: AC

## 2024-12-11 ENCOUNTER — Other Ambulatory Visit: Payer: Self-pay | Admitting: Family Medicine

## 2024-12-12 ENCOUNTER — Telehealth: Payer: Self-pay

## 2024-12-12 NOTE — Telephone Encounter (Signed)
 Requested Prescriptions  Refused Prescriptions Disp Refills   linagliptin  (TRADJENTA ) 5 MG TABS tablet 30 tablet 0    Sig: Take 1 tablet (5 mg total) by mouth daily.     Endocrinology:  Diabetes - DPP-4 Inhibitors - linagliptin  Failed - 12/12/2024  3:28 PM      Failed - HBA1C is between 0 and 7.9 and within 180 days    Hgb A1c MFr Bld  Date Value Ref Range Status  10/29/2024 9.7 (H) 4.8 - 5.6 % Final    Comment:    (NOTE) Diagnosis of Diabetes The following HbA1c ranges recommended by the American Diabetes Association (ADA) may be used as an aid in the diagnosis of diabetes mellitus.  Hemoglobin             Suggested A1C NGSP%              Diagnosis  <5.7                   Non Diabetic  5.7-6.4                Pre-Diabetic  >6.4                   Diabetic  <7.0                   Glycemic control for                       adults with diabetes.           Passed - Valid encounter within last 6 months    Recent Outpatient Visits           4 days ago Urinary incontinence, unspecified type   [facility] at [facility], Vinay K, MD   2 weeks ago Essential hypertension   [place] [facility] at [facility], Vinay K, MD       Future Appointments             In 1 month Penumalli, Vikram R, MD [facility]             carbidopa -levodopa  (SINEMET  IR) 25-100 MG tablet 90 tablet 0    Sig: Take 1 tablet by mouth 3 (three) times daily.     Neurology:  Parkinsonian Agents 2 Failed - 12/12/2024  3:28 PM      Failed - WBC in normal range and within 360 days    WBC  Date Value Ref Range Status  10/27/2024 10.6 (H) 4.0 - 10.5 K/uL Final         Failed - HGB in normal range and within 360 days    Hemoglobin  Date Value Ref Range Status  10/27/2024 10.9 (L) 12.0 - 15.0 g/dL Final         Failed - HCT in normal range and within 360 days    HCT  Date Value Ref Range  Status  10/27/2024 33.0 (L) 36.0 - 46.0 % Final         Passed - PLT in normal range and within 360 days    Platelets  Date Value Ref Range Status  10/27/2024 199 150 - 400 K/uL Final         Passed - Cr in normal range and within 360 days    Creatinine, Ser  Date Value Ref Range Status  11/09/2024 0.89  0.44 - 1.00 mg/dL Final         Passed - AST in normal range and within 360 days    AST  Date Value Ref Range Status  11/09/2024 22 15 - 41 U/L Final         Passed - ALT in normal range and within 360 days    ALT  Date Value Ref Range Status  11/09/2024 8 0 - 44 U/L Final         Passed - Last BP in normal range    BP Readings from Last 1 Encounters:  12/08/24 100/70         Passed - Valid encounter within last 12 months    Recent Outpatient Visits           4 days ago Urinary incontinence, unspecified type   [facility] at [facility], Vinay K, MD   2 weeks ago Essential hypertension   [facility] at [facility] [place], Vinay K, MD       Future Appointments             In 1 month Penumalli, Eduard SAUNDERS, MD [facility]

## 2024-12-12 NOTE — Telephone Encounter (Signed)
 DME Orders faxed by [employer] at [phone] on 12/08/2024.  Incontinence pads, food supplement drinks, gloves and wipes

## 2024-12-15 ENCOUNTER — Telehealth: Payer: Self-pay | Admitting: Family Medicine

## 2024-12-15 NOTE — Telephone Encounter (Signed)
 Copied from CRM #[id number]. Topic: General - Other >> Dec 15, 2024  1:09 PM Joesph B wrote: Reason for CRM: [employer] is waiting for pcp to sign form so they can send incontinence supplies. Please fu with pt after it has been sent.

## 2025-01-23 ENCOUNTER — Inpatient Hospital Stay: Admitting: Diagnostic Neuroimaging

## 2025-03-08 ENCOUNTER — Ambulatory Visit: Admitting: Family Medicine
